# Patient Record
Sex: Male | Born: 1950
Health system: Southern US, Community
[De-identification: ages and names within clinical notes are randomized; demographics above are authoritative.]

## PROBLEM LIST (undated history)

## (undated) DIAGNOSIS — E119 Type 2 diabetes mellitus without complications: Secondary | ICD-10-CM

## (undated) DIAGNOSIS — J439 Emphysema, unspecified: Secondary | ICD-10-CM

## (undated) DIAGNOSIS — G459 Transient cerebral ischemic attack, unspecified: Secondary | ICD-10-CM

## (undated) DIAGNOSIS — C61 Malignant neoplasm of prostate: Secondary | ICD-10-CM

## (undated) DIAGNOSIS — I10 Essential (primary) hypertension: Secondary | ICD-10-CM

## (undated) DIAGNOSIS — K5792 Diverticulitis of intestine, part unspecified, without perforation or abscess without bleeding: Secondary | ICD-10-CM

## (undated) DIAGNOSIS — J449 Chronic obstructive pulmonary disease, unspecified: Secondary | ICD-10-CM

## (undated) DIAGNOSIS — M549 Dorsalgia, unspecified: Secondary | ICD-10-CM

## (undated) DIAGNOSIS — M479 Spondylosis, unspecified: Secondary | ICD-10-CM

## (undated) DIAGNOSIS — K602 Anal fissure, unspecified: Secondary | ICD-10-CM

## (undated) DIAGNOSIS — R351 Nocturia: Secondary | ICD-10-CM

## (undated) DIAGNOSIS — G8929 Other chronic pain: Secondary | ICD-10-CM

## (undated) DIAGNOSIS — R011 Cardiac murmur, unspecified: Secondary | ICD-10-CM

## (undated) DIAGNOSIS — N400 Enlarged prostate without lower urinary tract symptoms: Secondary | ICD-10-CM

## (undated) HISTORY — PX: INGUINAL HERNIA REPAIR: SUR1180

## (undated) HISTORY — DX: Anal fissure, unspecified: K60.2

---

## 1997-08-21 ENCOUNTER — Ambulatory Visit (HOSPITAL_COMMUNITY): Admission: RE | Admit: 1997-08-21 | Discharge: 1997-08-21 | Payer: Self-pay | Admitting: Internal Medicine

## 1998-06-26 ENCOUNTER — Emergency Department (HOSPITAL_COMMUNITY): Admission: EM | Admit: 1998-06-26 | Discharge: 1998-06-26 | Payer: Self-pay | Admitting: Emergency Medicine

## 1998-06-26 ENCOUNTER — Encounter: Payer: Self-pay | Admitting: Emergency Medicine

## 1998-07-04 ENCOUNTER — Encounter: Admission: RE | Admit: 1998-07-04 | Discharge: 1998-07-04 | Payer: Self-pay | Admitting: Internal Medicine

## 2004-02-14 ENCOUNTER — Emergency Department: Payer: Self-pay | Admitting: Emergency Medicine

## 2004-03-18 ENCOUNTER — Inpatient Hospital Stay: Payer: Self-pay | Admitting: Internal Medicine

## 2004-03-18 ENCOUNTER — Other Ambulatory Visit: Payer: Self-pay

## 2004-03-19 ENCOUNTER — Other Ambulatory Visit: Payer: Self-pay

## 2004-08-31 ENCOUNTER — Emergency Department: Payer: Self-pay | Admitting: Emergency Medicine

## 2004-10-18 ENCOUNTER — Emergency Department: Payer: Self-pay | Admitting: General Practice

## 2004-12-16 ENCOUNTER — Emergency Department: Payer: Self-pay | Admitting: Emergency Medicine

## 2005-04-22 ENCOUNTER — Emergency Department: Payer: Self-pay | Admitting: Internal Medicine

## 2005-05-04 HISTORY — PX: LUMBAR DISC SURGERY: SHX700

## 2005-10-03 ENCOUNTER — Emergency Department: Payer: Self-pay | Admitting: Emergency Medicine

## 2005-10-21 ENCOUNTER — Emergency Department: Payer: Self-pay | Admitting: Unknown Physician Specialty

## 2006-02-21 ENCOUNTER — Emergency Department: Payer: Self-pay | Admitting: Emergency Medicine

## 2006-04-30 ENCOUNTER — Emergency Department: Payer: Self-pay | Admitting: Emergency Medicine

## 2006-07-20 ENCOUNTER — Emergency Department: Payer: Self-pay | Admitting: Emergency Medicine

## 2006-11-04 ENCOUNTER — Ambulatory Visit: Payer: Self-pay | Admitting: General Surgery

## 2007-02-24 ENCOUNTER — Emergency Department: Payer: Self-pay | Admitting: Emergency Medicine

## 2007-11-01 ENCOUNTER — Emergency Department: Payer: Self-pay | Admitting: Internal Medicine

## 2007-11-11 ENCOUNTER — Ambulatory Visit (HOSPITAL_COMMUNITY): Admission: RE | Admit: 2007-11-11 | Discharge: 2007-11-11 | Payer: Self-pay | Admitting: Neurological Surgery

## 2008-02-20 ENCOUNTER — Ambulatory Visit: Payer: Self-pay | Admitting: Neurological Surgery

## 2008-03-28 ENCOUNTER — Encounter: Payer: Self-pay | Admitting: Neurological Surgery

## 2008-04-04 ENCOUNTER — Observation Stay: Payer: Self-pay | Admitting: Internal Medicine

## 2010-09-01 ENCOUNTER — Other Ambulatory Visit: Payer: Self-pay | Admitting: Orthopedic Surgery

## 2010-09-01 DIAGNOSIS — M48062 Spinal stenosis, lumbar region with neurogenic claudication: Secondary | ICD-10-CM

## 2010-09-08 ENCOUNTER — Other Ambulatory Visit: Payer: Self-pay

## 2010-09-16 NOTE — Op Note (Signed)
NAMEEMMIT, Ryan Hopkins               ACCOUNT NO.:  [id number]   MEDICAL RECORD NO.:  [id number]          PATIENT TYPE:  OIB   LOCATION:  3534                         FACILITY:  [facility]   PHYSICIAN:  Tia Alert, MD     DATE OF BIRTH:  21-Sep-1950   DATE OF PROCEDURE:  11/11/2007  DATE OF DISCHARGE:  11/11/2007                               OPERATIVE REPORT   PREOPERATIVE DIAGNOSIS:  Lumbar spinal stenosis L4-5.   POSTOPERATIVE DIAGNOSIS:  Lumbar spinal stenosis L4-5.   PROCEDURE:  1. Decompressive lumbar hemilaminectomy.  2. Medial facetectomy and foraminotomy at L4-5 on the right for      decompression of the right L5 nerve root followed by sublaminar      decompression for central canal and left L5 nerve root      decompression.   SURGEON:  Marikay Alar, MD   ASSISTANT:  Reinaldo Meeker, MD   ANESTHESIA:  General endotracheal.   COMPLICATIONS:  None apparent.   INDICATIONS FOR THE PROCEDURE:  Mr. Ryan Hopkins is a 60 year old gentleman  who was referred with back and bilateral leg pain right greater than  left.  He had an MRI which showed moderately severe stenosis at L4-5.  He had tried medical management for quite sometime without significant  relief.  We talked about treatment options.  He wished to proceed with a  lumbar decompressive laminectomy at L4-5.  He understood the risks,  benefits, expected outcome and wished to proceed.   DESCRIPTION OF THE PROCEDURE:  The patient was taken to the operating  room and after induction of adequate generalized endotracheal  anesthesia, he was rolled in a prone position on the Wilson frame and  all pressure points were padded.  His lumbar region was prepped with  DuraPrep and then draped in usual sterile fashion.  Local anesthesia 5  mL was injected and a small dorsal midline incision was made and carried  down to the lumbosacral fascia.  The fascia was opened on the patient's  right side and taken down in a subperiosteal fashion to  expose L4-5 on  the right.  Intraoperative x-ray confirmed my level and the use of a  high-speed drill and a Kerrison punch was used to perform a  hemilaminectomy, medial facetectomy with foraminotomy at L4-5 on the  right side.  The underlying yellow ligament was quite overgrown.  It was  opened and removed in a piecemeal fashion to expose the underlying dura  in L5 nerve root.  I undercut the lateral recess to decompress the  lateral recess in the L5 nerve root.  I continued my laminotomy  superiorly until the dura was relaxed and the yellow ligament was  removed.  I then used the high-speed drill to drill up under the spinous  process and the Kerrison punch.  I then performed a sublaminar  decompression at L4-5 to decompress the central canal and the left L5  nerve root.  I dissected to the left until I could identify the disk  space.  There was no obvious disk herniation.  I could see the L5 nerve  root exiting and felt the pedicle palpated along the L5 nerve root on  the left to assure adequate decompression.  I then palpated the pedicle  along the nerve root on the right side.  I felt no further compression  of the L5 nerve roots bilaterally.  The disk space was then inspected  bilaterally. I found only a small subannular bulge.  I therefore  irrigated with saline solution containing bacitracin, dried all bleeding  points, lined the dura with Duragen to help prevent epidural fibrosis,  then closed the fascia with 0 Vicryl, closed the subcutaneous and  subcuticular tissue with 2-0 and 3-0 Vicryl and closed the skin with  Benzoin Steri-Strips.  The drapes were removed.  Sterile dressing was  applied.  The patient was awakened from general anesthesia and  transferred to the recovery room in stable condition.  At the end of the  procedure, all sponge, needle and instrument counts were correct.      Tia Alert, MD  Electronically Signed     DSJ/MEDQ  D:  11/11/2007  T:   11/11/2007  Job:  [id number]

## 2011-01-29 LAB — CBC
HCT: 45.4
Hemoglobin: 16
MCHC: 35.2
MCV: 91.1
Platelets: 271
RBC: 4.98
RDW: 14.4
WBC: 8.2

## 2011-01-29 LAB — BASIC METABOLIC PANEL
BUN: 13
CO2: 25
Calcium: 9.5
Chloride: 101
Creatinine, Ser: 1.34
GFR calc Af Amer: >60
GFR calc non Af Amer: 55 — ABNORMAL LOW
Glucose, Bld: 113 — ABNORMAL HIGH
Potassium: 3.5
Sodium: 134 — ABNORMAL LOW

## 2011-01-29 LAB — DIFFERENTIAL
Basophils Absolute: 0.1
Basophils Relative: 1
Eosinophils Relative: 2
Monocytes Absolute: 0.7
Monocytes Relative: 9
Neutro Abs: 4.9

## 2012-02-03 ENCOUNTER — Other Ambulatory Visit: Payer: Self-pay | Admitting: Neurological Surgery

## 2012-02-03 DIAGNOSIS — M545 Low back pain: Secondary | ICD-10-CM

## 2012-02-09 ENCOUNTER — Inpatient Hospital Stay: Admission: RE | Admit: 2012-02-09 | Payer: Self-pay | Source: Ambulatory Visit

## 2012-02-18 ENCOUNTER — Other Ambulatory Visit: Payer: Self-pay

## 2012-02-22 ENCOUNTER — Ambulatory Visit
Admission: RE | Admit: 2012-02-22 | Discharge: 2012-02-22 | Disposition: A | Discharge status: Home / Self Care | Payer: Medicare Other | Source: Ambulatory Visit | Attending: Neurological Surgery | Admitting: Neurological Surgery

## 2012-02-22 ENCOUNTER — Other Ambulatory Visit: Payer: Self-pay

## 2012-02-22 DIAGNOSIS — M545 Low back pain: Secondary | ICD-10-CM

## 2012-02-22 MED ORDER — GADOBENATE DIMEGLUMINE 529 MG/ML IV SOLN
20.0000 mL | Freq: Once | INTRAVENOUS | Status: AC | PRN
  Administered 2012-02-22: 20 mL via INTRAVENOUS

## 2012-04-15 ENCOUNTER — Encounter (HOSPITAL_COMMUNITY): Payer: Self-pay | Admitting: Emergency Medicine

## 2012-04-15 ENCOUNTER — Emergency Department (HOSPITAL_COMMUNITY)
Admission: EM | Admit: 2012-04-15 | Discharge: 2012-04-16 | Disposition: A | Discharge status: Home / Self Care | Payer: Medicare Other | Attending: Emergency Medicine | Admitting: Emergency Medicine

## 2012-04-15 DIAGNOSIS — J449 Chronic obstructive pulmonary disease, unspecified: Secondary | ICD-10-CM | POA: Insufficient documentation

## 2012-04-15 DIAGNOSIS — Z87448 Personal history of other diseases of urinary system: Secondary | ICD-10-CM | POA: Insufficient documentation

## 2012-04-15 DIAGNOSIS — R112 Nausea with vomiting, unspecified: Secondary | ICD-10-CM | POA: Insufficient documentation

## 2012-04-15 DIAGNOSIS — Z9889 Other specified postprocedural states: Secondary | ICD-10-CM | POA: Insufficient documentation

## 2012-04-15 DIAGNOSIS — J4489 Other specified chronic obstructive pulmonary disease: Secondary | ICD-10-CM | POA: Insufficient documentation

## 2012-04-15 DIAGNOSIS — K5732 Diverticulitis of large intestine without perforation or abscess without bleeding: Secondary | ICD-10-CM | POA: Insufficient documentation

## 2012-04-15 DIAGNOSIS — R05 Cough: Secondary | ICD-10-CM | POA: Insufficient documentation

## 2012-04-15 DIAGNOSIS — Z8739 Personal history of other diseases of the musculoskeletal system and connective tissue: Secondary | ICD-10-CM | POA: Insufficient documentation

## 2012-04-15 DIAGNOSIS — J438 Other emphysema: Secondary | ICD-10-CM | POA: Insufficient documentation

## 2012-04-15 DIAGNOSIS — R059 Cough, unspecified: Secondary | ICD-10-CM | POA: Insufficient documentation

## 2012-04-15 DIAGNOSIS — R1012 Left upper quadrant pain: Secondary | ICD-10-CM | POA: Insufficient documentation

## 2012-04-15 DIAGNOSIS — K5792 Diverticulitis of intestine, part unspecified, without perforation or abscess without bleeding: Secondary | ICD-10-CM

## 2012-04-15 DIAGNOSIS — Z79899 Other long term (current) drug therapy: Secondary | ICD-10-CM | POA: Insufficient documentation

## 2012-04-15 DIAGNOSIS — R197 Diarrhea, unspecified: Secondary | ICD-10-CM | POA: Insufficient documentation

## 2012-04-15 DIAGNOSIS — E876 Hypokalemia: Secondary | ICD-10-CM | POA: Insufficient documentation

## 2012-04-15 DIAGNOSIS — M549 Dorsalgia, unspecified: Secondary | ICD-10-CM | POA: Insufficient documentation

## 2012-04-15 DIAGNOSIS — I10 Essential (primary) hypertension: Secondary | ICD-10-CM | POA: Insufficient documentation

## 2012-04-15 DIAGNOSIS — Z87891 Personal history of nicotine dependence: Secondary | ICD-10-CM | POA: Insufficient documentation

## 2012-04-15 HISTORY — DX: Chronic obstructive pulmonary disease, unspecified: J44.9

## 2012-04-15 HISTORY — DX: Essential (primary) hypertension: I10

## 2012-04-15 HISTORY — DX: Emphysema, unspecified: J43.9

## 2012-04-15 HISTORY — DX: Benign prostatic hyperplasia without lower urinary tract symptoms: N40.0

## 2012-04-15 HISTORY — DX: Spondylosis, unspecified: M47.9

## 2012-04-15 LAB — CBC WITH DIFFERENTIAL/PLATELET
Basophils Absolute: 0.1 10*3/uL (ref 0.0–0.1)
Basophils Relative: 1 % (ref 0–1)
Eosinophils Absolute: 0.1 10*3/uL (ref 0.0–0.7)
Hemoglobin: 15.9 g/dL (ref 13.0–17.0)
Lymphocytes Relative: 27 % (ref 12–46)
MCH: 31.2 pg (ref 26.0–34.0)
MCHC: 35.9 g/dL (ref 30.0–36.0)
Monocytes Absolute: 0.7 10*3/uL (ref 0.1–1.0)
Neutro Abs: 5.4 10*3/uL (ref 1.7–7.7)
Neutrophils Relative %: 63 % (ref 43–77)
RDW: 14.7 % (ref 11.5–15.5)

## 2012-04-15 LAB — URINALYSIS, MICROSCOPIC ONLY
Leukocytes, UA: NEGATIVE
Nitrite: NEGATIVE
Specific Gravity, Urine: 1.008 (ref 1.005–1.030)
Urobilinogen, UA: 0.2 mg/dL (ref 0.0–1.0)
pH: 5 (ref 5.0–8.0)

## 2012-04-15 LAB — LIPASE, BLOOD: Lipase: 33 U/L (ref 11–59)

## 2012-04-15 LAB — COMPREHENSIVE METABOLIC PANEL
Albumin: 4.2 g/dL (ref 3.5–5.2)
BUN: 11 mg/dL (ref 6–23)
Calcium: 10 mg/dL (ref 8.4–10.5)
GFR calc Af Amer: 61 mL/min — ABNORMAL LOW (ref >90)
Glucose, Bld: 101 mg/dL — ABNORMAL HIGH (ref 70–99)
Sodium: 136 mEq/L (ref 135–145)
Total Protein: 7.5 g/dL (ref 6.0–8.3)

## 2012-04-15 NOTE — ED Notes (Signed)
Pt to ED via [facility].  Reports productive cough with dark brown/black phlegm, nausea, lower abd pain, frequent urination, diarrhea, vomited x 2 today and lower back pain/burning x 2-3 days.

## 2012-04-16 ENCOUNTER — Emergency Department (HOSPITAL_COMMUNITY): Payer: Medicare Other

## 2012-04-16 MED ORDER — ONDANSETRON HCL 4 MG/2ML IJ SOLN
4.0000 mg | Freq: Once | INTRAMUSCULAR | Status: AC
  Administered 2012-04-16: 4 mg via INTRAVENOUS
  Filled 2012-04-16: qty 2

## 2012-04-16 MED ORDER — OXYCODONE-ACETAMINOPHEN 5-325 MG PO TABS: 1.0000 | ORAL_TABLET | ORAL | Status: DC | PRN

## 2012-04-16 MED ORDER — HYDROMORPHONE HCL PF 1 MG/ML IJ SOLN
1.0000 mg | Freq: Once | INTRAMUSCULAR | Status: AC
  Administered 2012-04-16: 1 mg via INTRAVENOUS
  Filled 2012-04-16: qty 1

## 2012-04-16 MED ORDER — CIPROFLOXACIN HCL 500 MG PO TABS: 500.0000 mg | ORAL_TABLET | Freq: Two times a day (BID) | ORAL | Status: DC

## 2012-04-16 MED ORDER — POTASSIUM CHLORIDE CRYS ER 20 MEQ PO TBCR
40.0000 meq | EXTENDED_RELEASE_TABLET | Freq: Once | ORAL | Status: AC
  Administered 2012-04-16: 40 meq via ORAL
  Filled 2012-04-16: qty 2

## 2012-04-16 MED ORDER — METRONIDAZOLE 500 MG PO TABS
500.0000 mg | ORAL_TABLET | Freq: Once | ORAL | Status: AC
  Administered 2012-04-16: 500 mg via ORAL
  Filled 2012-04-16: qty 1

## 2012-04-16 MED ORDER — POTASSIUM CHLORIDE 10 MEQ/100ML IV SOLN
10.0000 meq | Freq: Once | INTRAVENOUS | Status: AC
  Administered 2012-04-16: 10 meq via INTRAVENOUS
  Filled 2012-04-16: qty 100

## 2012-04-16 MED ORDER — SODIUM CHLORIDE 0.9 % IV BOLUS (SEPSIS)
1000.0000 mL | Freq: Once | INTRAVENOUS | Status: AC
  Administered 2012-04-16: 1000 mL via INTRAVENOUS

## 2012-04-16 MED ORDER — POTASSIUM CHLORIDE ER 10 MEQ PO TBCR: 10.0000 meq | EXTENDED_RELEASE_TABLET | Freq: Two times a day (BID) | ORAL | Status: DC

## 2012-04-16 MED ORDER — CIPROFLOXACIN IN D5W 400 MG/200ML IV SOLN
400.0000 mg | Freq: Once | INTRAVENOUS | Status: AC
  Administered 2012-04-16: 400 mg via INTRAVENOUS
  Filled 2012-04-16: qty 200

## 2012-04-16 MED ORDER — METRONIDAZOLE 500 MG PO TABS: 500.0000 mg | ORAL_TABLET | Freq: Two times a day (BID) | ORAL | Status: DC

## 2012-04-16 MED ORDER — ONDANSETRON 4 MG PO TBDP: 4.0000 mg | ORAL_TABLET | Freq: Three times a day (TID) | ORAL | Status: DC | PRN

## 2012-04-16 NOTE — ED Notes (Signed)
Back from CT, no changes, alert, NAD, calm.  ?

## 2012-04-16 NOTE — ED Notes (Signed)
EDP Dr. Hyacinth Meeker at [facility]

## 2012-04-16 NOTE — ED Notes (Signed)
KCL run infusing, alert, NAD, calm, resting/ sleeping.

## 2012-04-16 NOTE — ED Provider Notes (Signed)
History     CSN: [id number]  Arrival date & time 04/15/12  2043   First MD Initiated Contact with Patient 04/16/12 0010      Chief Complaint  Patient presents with  . Cough  . Nausea  . Back Pain  . Abdominal Pain    (Consider location/radiation/quality/duration/timing/severity/associated sxs/prior treatment) HPI Comments: 61 year old male with a history of hypertension and emphysema who presents with a complaint of one week of gradual onset, persistent, gradually worsening lower abdominal pain on the left associated with loose stools which are nonbloody. He has occasional nausea and emesis but it is nonbloody and nonbilious. Nothing seems to make this better, he is able to tolerate fluids and has been making urine without difficulty or dysuria. He denies fevers, chills, swelling, rashes, headache. He does admit to having intermittent productive cough over the last week as well. There has been no travel, no recent antibiotics, no sick contacts.  Patient is a 61 y.o. male presenting with cough, back pain, and abdominal pain. The history is provided by the patient.  Cough  Back Pain  Associated symptoms include abdominal pain.  Abdominal Pain The primary symptoms of the illness include abdominal pain.  Additional symptoms associated with the illness include back pain.    Past Medical History  Diagnosis Date  . Hypertension   . COPD (chronic obstructive pulmonary disease)   . Emphysema   . Enlarged prostate   . Degenerative arthritis of spine     Past Surgical History  Procedure Date  . Back surgery     L4-L5  . Hernia repair     No family history on file.  History  Substance Use Topics  . Smoking status: Former Games developer  . Smokeless tobacco: Not on file  . Alcohol Use: No      Review of Systems  Respiratory: Positive for cough.   Gastrointestinal: Positive for abdominal pain.  Musculoskeletal: Positive for back pain.  All other systems reviewed and are  negative.    Allergies  Review of patient's allergies indicates no known allergies.  Home Medications   Current Outpatient Rx  Name  Route  Sig  Dispense  Refill  . ALBUTEROL SULFATE HFA 108 (90 BASE) MCG/ACT IN AERS   Inhalation   Inhale 2 puffs into the lungs every 6 (six) hours as needed. Shortness of breath         . FLUTICASONE-SALMETEROL 250-50 MCG/DOSE IN AEPB   Inhalation   Inhale 1 puff into the lungs every 12 (twelve) hours.         . IBUPROFEN 200 MG PO TABS   Oral   Take 200 mg by mouth every 6 (six) hours as needed. For headache or pain         . CIPROFLOXACIN HCL 500 MG PO TABS   Oral   Take 1 tablet (500 mg total) by mouth every 12 (twelve) hours.   20 tablet   0   . METRONIDAZOLE 500 MG PO TABS   Oral   Take 1 tablet (500 mg total) by mouth 2 (two) times daily.   20 tablet   0   . ONDANSETRON 4 MG PO TBDP   Oral   Take 1 tablet (4 mg total) by mouth every 8 (eight) hours as needed for nausea.   10 tablet   0   . OXYCODONE-ACETAMINOPHEN 5-325 MG PO TABS   Oral   Take 1 tablet by mouth every 4 (four) hours as needed for pain.  20 tablet   0   . POTASSIUM CHLORIDE ER 10 MEQ PO TBCR   Oral   Take 1 tablet (10 mEq total) by mouth 2 (two) times daily.   20 tablet   0     BP 151/100  Pulse 80  Temp 97.2 F (36.2 C) (Oral)  Resp 18  SpO2 97%  Physical Exam  Nursing note and vitals reviewed. Constitutional: He appears well-developed and well-nourished. No distress.  HENT:  Head: Normocephalic and atraumatic.  Mouth/Throat: Oropharynx is clear and moist. No oropharyngeal exudate.  Eyes: Conjunctivae normal and EOM are normal. Pupils are equal, round, and reactive to light. Right eye exhibits no discharge. Left eye exhibits no discharge. No scleral icterus.  Neck: Normal range of motion. Neck supple. No JVD present. No thyromegaly present.  Cardiovascular: Normal rate, regular rhythm, normal heart sounds and intact distal pulses.  Exam  reveals no gallop and no friction rub.   No murmur heard. Pulmonary/Chest: Effort normal and breath sounds normal. No respiratory distress. He has no wheezes. He has no rales.  Abdominal: Soft. He exhibits no distension and no mass. There is tenderness ( Left lower quadrant tenderness, left upper quadrant tenderness, no right sided tenderness, no peritoneal signs, no masses, no guarding).       Increased bowel sounds  Musculoskeletal: Normal range of motion. He exhibits no edema and no tenderness.  Lymphadenopathy:    He has no cervical adenopathy.  Neurological: He is alert. Coordination normal.  Skin: Skin is warm and dry. No rash noted. No erythema.  Psychiatric: He has a normal mood and affect. His behavior is normal.    ED Course  Procedures (including critical care time)  Labs Reviewed  COMPREHENSIVE METABOLIC PANEL - Abnormal; Notable for the following:    Potassium 2.9 (*)     Glucose, Bld 101 (*)     Creatinine, Ser 1.38 (*)     GFR calc non Af Amer 53 (*)     GFR calc Af Amer 61 (*)     All other components within normal limits  URINALYSIS, MICROSCOPIC ONLY - Abnormal; Notable for the following:    APPearance CLOUDY (*)     All other components within normal limits  CBC WITH DIFFERENTIAL  LIPASE, BLOOD   Dg Chest 2 View  04/16/2012  *RADIOLOGY REPORT*  Clinical Data: Shortness of breath and weakness.  CHEST - 2 VIEW  Comparison: 11/08/2007  Findings: The heart size and pulmonary vascularity are normal. The lungs appear clear and expanded without focal air space disease or consolidation. No blunting of the costophrenic angles.  No pneumothorax.  Mediastinal contours appear intact.  Degenerative changes in the spine.  No significant change since previous study.  IMPRESSION: No evidence of active pulmonary disease.   Original Report Authenticated By: Burman Nieves, M.D.      1. Diverticulitis   2. Hypokalemia       MDM  Mucous membranes are moist, he does not have  any signs of tachycardia hypotension or hypoxia and is not febrile. He does have hypokalemia and his symptoms are consistent with a colitis or diverticulitis. He'll be given IV antibiotics, fluids, pain medication, but potassium supplementation and Zofran. He does not need CT scan at this time as he is afebrile and is unlikely to have an abscess without a leukocytosis of fever or tachycardia.  The patient has been reevaluated, he appears much more comfortable, his symptoms have improved significantly, lab work has been reviewed showing  hypokalemia but no other significant findings.  PA and lateral views of the chest were obtained by digital radiography. I have personally interpreted these x-rays and find her to be no signs of pulmonary infiltrate, cardiomegaly, subdiaphragmatic free air, soft tissue abnormality, no obvious bony abnormalities or fractures.  Discharge with Percocet, Zofran, ciprofloxacin, Flagyl, potassium  The patient has been informed of indications for return, resource list given for followup.  Vida Roller, MD 04/16/12 [phone]

## 2012-09-23 ENCOUNTER — Encounter (HOSPITAL_COMMUNITY): Payer: Self-pay | Admitting: Cardiology

## 2012-09-23 ENCOUNTER — Emergency Department (HOSPITAL_COMMUNITY)
Admission: EM | Admit: 2012-09-23 | Discharge: 2012-09-23 | Disposition: A | Discharge status: Home / Self Care | Payer: Medicare Other | Attending: Emergency Medicine | Admitting: Emergency Medicine

## 2012-09-23 DIAGNOSIS — IMO0002 Reserved for concepts with insufficient information to code with codable children: Secondary | ICD-10-CM | POA: Insufficient documentation

## 2012-09-23 DIAGNOSIS — R197 Diarrhea, unspecified: Secondary | ICD-10-CM | POA: Insufficient documentation

## 2012-09-23 DIAGNOSIS — I10 Essential (primary) hypertension: Secondary | ICD-10-CM | POA: Insufficient documentation

## 2012-09-23 DIAGNOSIS — Z8739 Personal history of other diseases of the musculoskeletal system and connective tissue: Secondary | ICD-10-CM | POA: Insufficient documentation

## 2012-09-23 DIAGNOSIS — Z7982 Long term (current) use of aspirin: Secondary | ICD-10-CM | POA: Insufficient documentation

## 2012-09-23 DIAGNOSIS — M5416 Radiculopathy, lumbar region: Secondary | ICD-10-CM

## 2012-09-23 DIAGNOSIS — R350 Frequency of micturition: Secondary | ICD-10-CM | POA: Insufficient documentation

## 2012-09-23 DIAGNOSIS — Z8719 Personal history of other diseases of the digestive system: Secondary | ICD-10-CM | POA: Insufficient documentation

## 2012-09-23 DIAGNOSIS — Z79899 Other long term (current) drug therapy: Secondary | ICD-10-CM | POA: Insufficient documentation

## 2012-09-23 DIAGNOSIS — Z87891 Personal history of nicotine dependence: Secondary | ICD-10-CM | POA: Insufficient documentation

## 2012-09-23 DIAGNOSIS — M6281 Muscle weakness (generalized): Secondary | ICD-10-CM | POA: Insufficient documentation

## 2012-09-23 DIAGNOSIS — Z87448 Personal history of other diseases of urinary system: Secondary | ICD-10-CM | POA: Insufficient documentation

## 2012-09-23 DIAGNOSIS — J438 Other emphysema: Secondary | ICD-10-CM | POA: Insufficient documentation

## 2012-09-23 DIAGNOSIS — Z9889 Other specified postprocedural states: Secondary | ICD-10-CM | POA: Insufficient documentation

## 2012-09-23 MED ORDER — OXYCODONE-ACETAMINOPHEN 5-325 MG PO TABS
1.0000 | ORAL_TABLET | Freq: Once | ORAL | Status: AC
  Administered 2012-09-23: 1 via ORAL
  Filled 2012-09-23: qty 1

## 2012-09-23 MED ORDER — OXYCODONE-ACETAMINOPHEN 5-325 MG PO TABS: ORAL_TABLET | ORAL | Status: DC

## 2012-09-23 MED ORDER — IBUPROFEN 400 MG PO TABS
400.0000 mg | ORAL_TABLET | Freq: Once | ORAL | Status: AC
  Administered 2012-09-23: 400 mg via ORAL
  Filled 2012-09-23: qty 1

## 2012-09-23 NOTE — ED Notes (Signed)
PA at bedside.

## 2012-09-23 NOTE — ED Provider Notes (Addendum)
History    This chart was scribed for a non-physician practitioner, Wynetta Emery, working with Hurman Horn, MD by Frederik Pear, ED Scribe. This patient was seen in room TR04C/TR04C and the patient's care was started at 1503.     CSN: [id number]  Arrival date & time 09/23/12  1445   First MD Initiated Contact with Patient 09/23/12 1503      Chief Complaint  Patient presents with  . Back Pain    (Consider location/radiation/quality/duration/timing/severity/associated sxs/prior treatment) The history is provided by the patient and medical records. No language interpreter was used.    HPI Comments: Ryan Hopkins is a 62 y.o. male who presents to the Emergency Department complaining of sudden onset, 9/10, gradually worsening, constant left-sided lower back pain that shoot down his bilateral legs that is not aggravated or alleviated by anything and began 3 days ago. Endorses urinary frequency at night which is at his baseline. He also had a single episode of diarrhea in the last week. Patient denies abdominal pain, weakness, injury or trauma, fever or hematuria. Denies treatment at home. He has no h/o of cancer or IV drug use, but has a h/o of diverticulosis.   Past Medical History  Diagnosis Date  . Hypertension   . COPD (chronic obstructive pulmonary disease)   . Emphysema   . Enlarged prostate   . Degenerative arthritis of spine     Past Surgical History  Procedure Laterality Date  . Back surgery      L4-L5  . Hernia repair      History reviewed. No pertinent family history.  History  Substance Use Topics  . Smoking status: Former Games developer  . Smokeless tobacco: Not on file  . Alcohol Use: No      Review of Systems  Respiratory: Negative for shortness of breath.   Gastrointestinal: Positive for diarrhea. Negative for nausea and vomiting.  Genitourinary: Positive for frequency.  Musculoskeletal: Positive for back pain.  All other systems reviewed and are  negative.    Allergies  Review of patient's allergies indicates no known allergies.  Home Medications   Current Outpatient Rx  Name  Route  Sig  Dispense  Refill  . albuterol (PROVENTIL HFA;VENTOLIN HFA) 108 (90 BASE) MCG/ACT inhaler   Inhalation   Inhale 2 puffs into the lungs every 6 (six) hours as needed. Shortness of breath         . aspirin EC 81 MG tablet   Oral   Take 81 mg by mouth daily.         . Fluticasone-Salmeterol (ADVAIR) 250-50 MCG/DOSE AEPB   Inhalation   Inhale 1 puff into the lungs every 12 (twelve) hours.         . hydrochlorothiazide (HYDRODIURIL) 25 MG tablet   Oral   Take 25 mg by mouth daily.           BP 134/98  Pulse 111  Temp(Src) 98 F (36.7 C) (Oral)  Resp 20  SpO2 99%  Physical Exam  Nursing note and vitals reviewed. Constitutional: He is oriented to person, place, and time. He appears well-developed and well-nourished. No distress.  HENT:  Head: Normocephalic.  Eyes: Conjunctivae and EOM are normal.  Cardiovascular: Normal rate.   Pulmonary/Chest: Effort normal. No stridor.  Abdominal: Soft. He exhibits no distension.  Musculoskeletal: Normal range of motion. He exhibits tenderness.  No midline tenderness to palationp or step offs. Mild left paraspinal spasms with tenderness.   Neurological: He is alert and oriented  to person, place, and time.  Psychiatric: He has a normal mood and affect.    ED Course  Procedures (including critical care time)  DIAGNOSTIC STUDIES: Oxygen Saturation is 99% on room air, normal by my interpretation.    COORDINATION OF CARE:  16:30- Discussed planned course of treatment with the patient, including Percocet and Advil, who is agreeable at this time.  16:45- Medication Orders- ibuprofen (advil, motrin) tablet 400 mg- once, oxycodone-acetaminophen (perceocet/roxicet) 5-325 mg per tablet 1 tablet- once.  Labs Reviewed - No data to display No results found.   1. Lumbar radiculopathy        MDM   Filed Vitals:   09/23/12 1447 09/23/12 1645  BP: 134/98 144/98  Pulse: 111 98  Temp: 98 F (36.7 C)   TempSrc: Oral   Resp: 20   SpO2: 99% 98%     Leonte Horrigan is a 62 y.o. male with exacerbation of lumbar radiculopathy, no red flags.   Medications  ibuprofen (ADVIL,MOTRIN) tablet 400 mg (400 mg Oral Given 09/23/12 1644)  oxyCODONE-acetaminophen (PERCOCET/ROXICET) 5-325 MG per tablet 1 tablet (1 tablet Oral Given 09/23/12 1644)    The patient is hemodynamically stable, appropriate for, and amenable to, discharge at this time. Pt verbalized understanding and agrees with care plan. Outpatient follow-up and return precautions given.    Discharge Medication List as of 09/23/2012  4:35 PM    START taking these medications   Details  oxyCODONE-acetaminophen (PERCOCET/ROXICET) 5-325 MG per tablet 1 to 2 tabs PO q6hrs  PRN for pain, Print              Wynetta Emery, PA-C 09/24/12 [address], PA-C 09/29/12 1804

## 2012-09-23 NOTE — ED Notes (Signed)
Pt reports lower back pain that started about a week ago. States he has been increasing his daily walking. Denies trying any OTC medications but has tried heat to the area. Denies any heavy lifting or moving.

## 2012-09-26 NOTE — ED Provider Notes (Signed)
Medical screening examination/treatment/procedure(s) were performed by non-physician practitioner and as supervising physician I was immediately available for consultation/collaboration.   Taiven Greenley M Sarinah Doetsch, MD 09/26/12 1723 

## 2012-09-29 NOTE — ED Provider Notes (Signed)
Medical screening examination/treatment/procedure(s) were performed by non-physician practitioner and as supervising physician I was immediately available for consultation/collaboration.  Hurman Horn, MD 09/29/12 [phone]

## 2012-10-16 ENCOUNTER — Emergency Department (HOSPITAL_COMMUNITY)
Admission: EM | Admit: 2012-10-16 | Discharge: 2012-10-16 | Disposition: A | Discharge status: Home / Self Care | Payer: Medicare Other | Attending: Emergency Medicine | Admitting: Emergency Medicine

## 2012-10-16 ENCOUNTER — Encounter (HOSPITAL_COMMUNITY): Payer: Self-pay | Admitting: *Deleted

## 2012-10-16 DIAGNOSIS — I10 Essential (primary) hypertension: Secondary | ICD-10-CM

## 2012-10-16 DIAGNOSIS — J449 Chronic obstructive pulmonary disease, unspecified: Secondary | ICD-10-CM | POA: Insufficient documentation

## 2012-10-16 DIAGNOSIS — J4489 Other specified chronic obstructive pulmonary disease: Secondary | ICD-10-CM | POA: Insufficient documentation

## 2012-10-16 DIAGNOSIS — N400 Enlarged prostate without lower urinary tract symptoms: Secondary | ICD-10-CM | POA: Insufficient documentation

## 2012-10-16 DIAGNOSIS — Z87891 Personal history of nicotine dependence: Secondary | ICD-10-CM | POA: Insufficient documentation

## 2012-10-16 DIAGNOSIS — Z885 Allergy status to narcotic agent status: Secondary | ICD-10-CM | POA: Insufficient documentation

## 2012-10-16 DIAGNOSIS — R51 Headache: Secondary | ICD-10-CM | POA: Insufficient documentation

## 2012-10-16 DIAGNOSIS — M479 Spondylosis, unspecified: Secondary | ICD-10-CM | POA: Insufficient documentation

## 2012-10-16 DIAGNOSIS — Z79899 Other long term (current) drug therapy: Secondary | ICD-10-CM | POA: Insufficient documentation

## 2012-10-16 DIAGNOSIS — G8929 Other chronic pain: Secondary | ICD-10-CM | POA: Insufficient documentation

## 2012-10-16 DIAGNOSIS — Z7982 Long term (current) use of aspirin: Secondary | ICD-10-CM | POA: Insufficient documentation

## 2012-10-16 DIAGNOSIS — M549 Dorsalgia, unspecified: Secondary | ICD-10-CM

## 2012-10-16 DIAGNOSIS — J438 Other emphysema: Secondary | ICD-10-CM | POA: Insufficient documentation

## 2012-10-16 MED ORDER — HYDROCHLOROTHIAZIDE 25 MG PO TABS: 25.0000 mg | ORAL_TABLET | Freq: Every day | ORAL | Status: DC

## 2012-10-16 MED ORDER — IBUPROFEN 400 MG PO TABS: 400.0000 mg | ORAL_TABLET | Freq: Four times a day (QID) | ORAL | Status: DC | PRN

## 2012-10-16 MED ORDER — HYDROCODONE-ACETAMINOPHEN 5-325 MG PO TABS
1.0000 | ORAL_TABLET | Freq: Once | ORAL | Status: AC
  Administered 2012-10-16: 1 via ORAL
  Filled 2012-10-16: qty 1

## 2012-10-16 MED ORDER — ONDANSETRON 4 MG PO TBDP
8.0000 mg | ORAL_TABLET | Freq: Once | ORAL | Status: AC
  Administered 2012-10-16: 8 mg via ORAL

## 2012-10-16 MED ORDER — IBUPROFEN 400 MG PO TABS
400.0000 mg | ORAL_TABLET | Freq: Once | ORAL | Status: AC
  Administered 2012-10-16: 400 mg via ORAL
  Filled 2012-10-16: qty 1

## 2012-10-16 MED ORDER — ONDANSETRON 4 MG PO TBDP
ORAL_TABLET | ORAL | Status: AC
  Filled 2012-10-16: qty 2

## 2012-10-16 MED ORDER — OXYCODONE-ACETAMINOPHEN 5-325 MG PO TABS: 1.0000 | ORAL_TABLET | Freq: Four times a day (QID) | ORAL | Status: DC | PRN

## 2012-10-16 MED ORDER — HYDROCHLOROTHIAZIDE 25 MG PO TABS
25.0000 mg | ORAL_TABLET | Freq: Every day | ORAL | Status: DC
  Administered 2012-10-16: 25 mg via ORAL
  Filled 2012-10-16: qty 1

## 2012-10-16 NOTE — ED Notes (Signed)
PT . AMBULATED TO TOILET . RESPIRATIONS UNLABORED.

## 2012-10-16 NOTE — ED Notes (Signed)
Pt in c/o chronic lower back pain, states he has been out of his medication x2 week so pain has increased, also states he has been having headaches due to elevated BP, states he has been out of BP medication x2 months and is unable to get a doctor to write him a refill.

## 2012-10-17 MED ORDER — OXYCODONE-ACETAMINOPHEN 5-325 MG PO TABS: 1.0000 | ORAL_TABLET | Freq: Four times a day (QID) | ORAL | Status: DC | PRN

## 2012-10-20 NOTE — ED Provider Notes (Signed)
History     CSN: [id number]  Arrival date & time 10/16/12  1705   First MD Initiated Contact with Patient 10/16/12 1916      Chief Complaint  Patient presents with  . Back Pain  . Hypertension    (Consider location/radiation/quality/duration/timing/severity/associated sxs/prior treatment) HPI Comments: Pt comes in with cc of back pain and HTN. Has hx of HTN, not seen a PCP in months and hasn't taken BP meds in months too. Complains of mild headaches intermittently - which got him concerned. No nausea, vomiting, visual complains, seizures, altered mental status, loss of consciousness, new weakness, or numbness, no gait instability. Pt also has chronic back pain - and had a mild flare up recently.  No associated numbness, weakness, urinary incontinence, urinary retention, bowel incontinence, saddle anesthesia.     Patient is a 62 y.o. male presenting with back pain and hypertension. The history is provided by the patient.  Back Pain Associated symptoms: headaches   Associated symptoms: no abdominal pain, no chest pain and no dysuria   Hypertension Associated symptoms include headaches. Pertinent negatives include no chest pain, no abdominal pain and no shortness of breath.    Past Medical History  Diagnosis Date  . Hypertension   . COPD (chronic obstructive pulmonary disease)   . Emphysema   . Enlarged prostate   . Degenerative arthritis of spine     Past Surgical History  Procedure Laterality Date  . Back surgery      L4-L5  . Hernia repair      History reviewed. No pertinent family history.  History  Substance Use Topics  . Smoking status: Former Games developer  . Smokeless tobacco: Not on file  . Alcohol Use: No      Review of Systems  Constitutional: Negative for chills, activity change and appetite change.  HENT: Negative for neck pain.   Eyes: Negative for visual disturbance.  Respiratory: Negative for cough, chest tightness and shortness of breath.    Cardiovascular: Negative for chest pain.  Gastrointestinal: Negative for abdominal pain and abdominal distention.  Genitourinary: Negative for dysuria, enuresis and difficulty urinating.  Musculoskeletal: Positive for back pain. Negative for arthralgias.  Neurological: Positive for headaches. Negative for dizziness and light-headedness.  Psychiatric/Behavioral: Negative for confusion.    Allergies  Vicodin  Home Medications   Current Outpatient Rx  Name  Route  Sig  Dispense  Refill  . albuterol (PROVENTIL HFA;VENTOLIN HFA) 108 (90 BASE) MCG/ACT inhaler   Inhalation   Inhale 2 puffs into the lungs every 6 (six) hours as needed. Shortness of breath         . aspirin EC 81 MG tablet   Oral   Take 81 mg by mouth daily.         . Fluticasone-Salmeterol (ADVAIR) 250-50 MCG/DOSE AEPB   Inhalation   Inhale 1 puff into the lungs every 12 (twelve) hours.         Marland Kitchen oxyCODONE-acetaminophen (PERCOCET/ROXICET) 5-325 MG per tablet   Oral   Take 1-2 tablets by mouth every 6 (six) hours as needed for pain.         . hydrochlorothiazide (HYDRODIURIL) 25 MG tablet   Oral   Take 1 tablet (25 mg total) by mouth daily.   30 tablet   1   . ibuprofen (ADVIL,MOTRIN) 400 MG tablet   Oral   Take 1 tablet (400 mg total) by mouth every 6 (six) hours as needed for pain.   30 tablet   0   .  oxyCODONE-acetaminophen (ROXICET) 5-325 MG per tablet   Oral   Take 1 tablet by mouth every 6 (six) hours as needed for pain.   15 tablet   0     BP 162/113  Pulse 89  Temp(Src) 98.4 F (36.9 C) (Oral)  Resp 14  SpO2 98%  Physical Exam  Nursing note and vitals reviewed. Constitutional: He is oriented to person, place, and time. He appears well-developed.  HENT:  Head: Normocephalic and atraumatic.  Eyes: Conjunctivae and EOM are normal. Pupils are equal, round, and reactive to light.  Neck: Normal range of motion. Neck supple.  Cardiovascular: Normal rate and regular rhythm.    Pulmonary/Chest: Effort normal and breath sounds normal.  Abdominal: Soft. Bowel sounds are normal. He exhibits no distension. There is no tenderness. There is no rebound and no guarding.  Neurological: He is alert and oriented to person, place, and time. No cranial nerve deficit. Coordination normal.  Skin: Skin is warm.    ED Course  Procedures (including critical care time)  Labs Reviewed - No data to display No results found.   1. HTN (hypertension)   2. Back pain       MDM  Pt comes in with cc of HTN and back pain.  Back pain is chronic. No red flags associated with it. No need for imaging. Will give some analgesics.  HTN - uncontrolled due to non compliance with meds. On HCTZ. The BP is not severely high, and on hx and exam - there is no signs of end organ damage. No chest pains, no severe headaches, no visual complains - no indication for Trops, EKG etc.  Derwood Kaplan, MD 10/20/12 4098

## 2012-10-28 ENCOUNTER — Emergency Department (HOSPITAL_COMMUNITY)
Admission: EM | Admit: 2012-10-28 | Discharge: 2012-10-28 | Disposition: A | Discharge status: Home / Self Care | Payer: Medicare Other | Attending: Emergency Medicine | Admitting: Emergency Medicine

## 2012-10-28 ENCOUNTER — Encounter (HOSPITAL_COMMUNITY): Payer: Self-pay | Admitting: Emergency Medicine

## 2012-10-28 DIAGNOSIS — Z8739 Personal history of other diseases of the musculoskeletal system and connective tissue: Secondary | ICD-10-CM | POA: Insufficient documentation

## 2012-10-28 DIAGNOSIS — Z9889 Other specified postprocedural states: Secondary | ICD-10-CM | POA: Insufficient documentation

## 2012-10-28 DIAGNOSIS — Z87448 Personal history of other diseases of urinary system: Secondary | ICD-10-CM | POA: Insufficient documentation

## 2012-10-28 DIAGNOSIS — Z87891 Personal history of nicotine dependence: Secondary | ICD-10-CM | POA: Insufficient documentation

## 2012-10-28 DIAGNOSIS — I10 Essential (primary) hypertension: Secondary | ICD-10-CM | POA: Insufficient documentation

## 2012-10-28 DIAGNOSIS — Z7902 Long term (current) use of antithrombotics/antiplatelets: Secondary | ICD-10-CM | POA: Insufficient documentation

## 2012-10-28 DIAGNOSIS — M549 Dorsalgia, unspecified: Secondary | ICD-10-CM | POA: Insufficient documentation

## 2012-10-28 DIAGNOSIS — G8929 Other chronic pain: Secondary | ICD-10-CM

## 2012-10-28 DIAGNOSIS — J449 Chronic obstructive pulmonary disease, unspecified: Secondary | ICD-10-CM | POA: Insufficient documentation

## 2012-10-28 DIAGNOSIS — J4489 Other specified chronic obstructive pulmonary disease: Secondary | ICD-10-CM | POA: Insufficient documentation

## 2012-10-28 DIAGNOSIS — Z7982 Long term (current) use of aspirin: Secondary | ICD-10-CM | POA: Insufficient documentation

## 2012-10-28 HISTORY — DX: Dorsalgia, unspecified: M54.9

## 2012-10-28 HISTORY — DX: Other chronic pain: G89.29

## 2012-10-28 MED ORDER — OXYCODONE-ACETAMINOPHEN 5-325 MG PO TABS: 1.0000 | ORAL_TABLET | Freq: Four times a day (QID) | ORAL | Status: DC | PRN

## 2012-10-28 MED ORDER — ACETAMINOPHEN 325 MG PO TABS
650.0000 mg | ORAL_TABLET | Freq: Once | ORAL | Status: AC
  Administered 2012-10-28: 650 mg via ORAL
  Filled 2012-10-28: qty 2

## 2012-10-28 NOTE — ED Notes (Signed)
Pt reports he is very upset that he has been waiting so long when he is in "devastating back pain". Pt informed of wait times and staff apologized for having to keep him waiting. Pt given glass of water.

## 2012-10-28 NOTE — ED Notes (Signed)
C/o lower back pain since driving to [place] over the weekend.  States he has a history of chronic back pain.

## 2012-10-28 NOTE — ED Provider Notes (Signed)
History    This chart was scribed for non-physician practitioner, Fayrene Helper PA-C, working with Gwyneth Sprout, MD by Donne Anon, ED Scribe. This patient was seen in room TR07C/TR07C and the patient's care was started at 2033.  CSN: [id number] Arrival date & time 10/28/12  1948  First MD Initiated Contact with Patient 10/28/12 2033     Chief Complaint  Patient presents with  . Back Pain    HPI HPI Comments: Ryan Hopkins is a 62 y.o. male who presents to the Emergency Department complaining of 6 days of gradual onset, gradually worsening, chronic shooting back pain that radiates down both his calves and worsened over the weekend when he was on a long car ride. He states he previously had a L4 and L5 repaired in 2008 at [facility] and has had intermittent pain since then. Movement makes the pain worse.He denies fever, rash, incontinence, numbness, dizziness, lightheadedness or any other pain. He has an appointment next month to see his surgeon.  No prior hx of DVT or PE.  No c/o cp or sob.  Sts pain is primarily to his back from having to sit in the car for a long period of time.     Past Medical History  Diagnosis Date  . Hypertension   . COPD (chronic obstructive pulmonary disease)   . Emphysema   . Enlarged prostate   . Degenerative arthritis of spine   . Back pain, chronic    Past Surgical History  Procedure Laterality Date  . Back surgery      L4-L5  . Hernia repair     No family history on file. History  Substance Use Topics  . Smoking status: Former Games developer  . Smokeless tobacco: Not on file  . Alcohol Use: No    Review of Systems  Musculoskeletal: Positive for back pain.  Neurological: Negative for numbness.  All other systems reviewed and are negative.    Allergies  Vicodin  Home Medications   Current Outpatient Rx  Name  Route  Sig  Dispense  Refill  . albuterol (PROVENTIL HFA;VENTOLIN HFA) 108 (90 BASE) MCG/ACT inhaler   Inhalation   Inhale 2  puffs into the lungs every 6 (six) hours as needed. Shortness of breath         . aspirin EC 81 MG tablet   Oral   Take 81 mg by mouth daily.         . Fluticasone-Salmeterol (ADVAIR) 250-50 MCG/DOSE AEPB   Inhalation   Inhale 1 puff into the lungs every 12 (twelve) hours.         . hydrochlorothiazide (HYDRODIURIL) 25 MG tablet   Oral   Take 1 tablet (25 mg total) by mouth daily.   30 tablet   1   . ibuprofen (ADVIL,MOTRIN) 400 MG tablet   Oral   Take 1 tablet (400 mg total) by mouth every 6 (six) hours as needed for pain.   30 tablet   0   . oxyCODONE-acetaminophen (ROXICET) 5-325 MG per tablet   Oral   Take 1 tablet by mouth every 6 (six) hours as needed for pain.   15 tablet   0    BP 118/91  Pulse 109  Temp(Src) 98.1 F (36.7 C) (Oral)  Resp 18  SpO2 97%  Physical Exam  Nursing note and vitals reviewed. Constitutional: He appears well-developed and well-nourished. No distress.  HENT:  Head: Normocephalic and atraumatic.  Eyes: Conjunctivae are normal.  Neck: Neck supple.  No tracheal deviation present.  Cardiovascular: Normal rate and intact distal pulses.   Pulmonary/Chest: Effort normal. No respiratory distress.  Musculoskeletal: Normal range of motion. He exhibits no edema.  Tender to palpation over midline lumbar and paralumar region. Worsening pain with flexion and extension. No crepitance. No step offs. Negative straight leg raise bilaterally.  Neurological: He is alert.  Skin: Skin is warm and dry.  Well healed surgical scar. No rash. No abscess  Psychiatric: He has a normal mood and affect. His behavior is normal.    ED Course  Procedures (including critical care time) DIAGNOSTIC STUDIES: Oxygen Saturation is 97% on RA, adequate by my interpretation.    COORDINATION OF CARE: 10:15 PM Discussed treatment plan which includes pain medication with pt at bedside and pt agreed to plan. Discussed with pt the ED cannot prescribe narcotics for  chronic pain more than once.  Discussed that he will not receive narcotic pain medication from ER for future visits if related to chronic pain.  Do not think pt has DVT.     Labs Reviewed - No data to display No results found. 1. Chronic back pain     MDM  BP 131/94  Pulse 91  Temp(Src) 98.1 F (36.7 C) (Oral)  Resp 18  SpO2 98%   I personally performed the services described in this documentation, which was scribed in my presence. The recorded information has been reviewed and is accurate.     Fayrene Helper, PA-C 10/28/12 2314

## 2012-10-29 NOTE — ED Provider Notes (Signed)
Medical screening examination/treatment/procedure(s) were performed by non-physician practitioner and as supervising physician I was immediately available for consultation/collaboration.   Gwyneth Sprout, MD 10/29/12 [phone]

## 2013-01-16 ENCOUNTER — Emergency Department (HOSPITAL_COMMUNITY)
Admission: EM | Admit: 2013-01-16 | Discharge: 2013-01-16 | Disposition: A | Discharge status: Home / Self Care | Payer: Medicare Other | Attending: Emergency Medicine | Admitting: Emergency Medicine

## 2013-01-16 ENCOUNTER — Encounter (HOSPITAL_COMMUNITY): Payer: Self-pay | Admitting: *Deleted

## 2013-01-16 DIAGNOSIS — G894 Chronic pain syndrome: Secondary | ICD-10-CM | POA: Insufficient documentation

## 2013-01-16 DIAGNOSIS — Z79899 Other long term (current) drug therapy: Secondary | ICD-10-CM | POA: Insufficient documentation

## 2013-01-16 DIAGNOSIS — M545 Low back pain, unspecified: Secondary | ICD-10-CM | POA: Insufficient documentation

## 2013-01-16 DIAGNOSIS — J4489 Other specified chronic obstructive pulmonary disease: Secondary | ICD-10-CM | POA: Insufficient documentation

## 2013-01-16 DIAGNOSIS — Z7982 Long term (current) use of aspirin: Secondary | ICD-10-CM | POA: Insufficient documentation

## 2013-01-16 DIAGNOSIS — M479 Spondylosis, unspecified: Secondary | ICD-10-CM | POA: Insufficient documentation

## 2013-01-16 DIAGNOSIS — N400 Enlarged prostate without lower urinary tract symptoms: Secondary | ICD-10-CM | POA: Insufficient documentation

## 2013-01-16 DIAGNOSIS — Z87891 Personal history of nicotine dependence: Secondary | ICD-10-CM | POA: Insufficient documentation

## 2013-01-16 DIAGNOSIS — Z885 Allergy status to narcotic agent status: Secondary | ICD-10-CM | POA: Insufficient documentation

## 2013-01-16 DIAGNOSIS — J449 Chronic obstructive pulmonary disease, unspecified: Secondary | ICD-10-CM | POA: Insufficient documentation

## 2013-01-16 DIAGNOSIS — I10 Essential (primary) hypertension: Secondary | ICD-10-CM | POA: Insufficient documentation

## 2013-01-16 DIAGNOSIS — G8929 Other chronic pain: Secondary | ICD-10-CM

## 2013-01-16 DIAGNOSIS — J438 Other emphysema: Secondary | ICD-10-CM | POA: Insufficient documentation

## 2013-01-16 DIAGNOSIS — M542 Cervicalgia: Secondary | ICD-10-CM | POA: Insufficient documentation

## 2013-01-16 DIAGNOSIS — Z8719 Personal history of other diseases of the digestive system: Secondary | ICD-10-CM | POA: Insufficient documentation

## 2013-01-16 HISTORY — DX: Diverticulitis of intestine, part unspecified, without perforation or abscess without bleeding: K57.92

## 2013-01-16 MED ORDER — OXYCODONE-ACETAMINOPHEN 5-325 MG PO TABS
2.0000 | ORAL_TABLET | Freq: Once | ORAL | Status: AC
  Administered 2013-01-16: 2 via ORAL
  Filled 2013-01-16: qty 2

## 2013-01-16 NOTE — ED Provider Notes (Signed)
CSN: [id number]     Arrival date & time 01/16/13  1010 History  This chart was scribed for Rhea Bleacher, PA, working with Celene Kras, MD by Blanchard Kelch, ED Scribe. This patient was seen in room TR06C/TR06C and the patient's care was started at 10:40 AM.    Chief Complaint  Patient presents with  . Leg Pain  . Arm Pain    Patient is a 62 y.o. male presenting with leg pain and arm pain. The history is provided by the patient. No language interpreter was used.  Leg Pain Associated symptoms: back pain and neck pain   Associated symptoms: no fever   Arm Pain    HPI Comments: Ryan Hopkins is a 62 y.o. male with a history of chronic pain who presents to the Emergency Department complaining of constant, worsening back pain that radiates down the right leg and sometimes the left. He also has associated neck and arm pain. He denies any recent injuries that could have caused the worsening pain. He has been taking OTC motrin with no relief. He is prescribed Percocet for his chronic pain and reports the last time he used it was last week. Substance reporting database shows #120 Percocet 5-325 prescribed by Dub Amis on 12/31/2012. When asked why he is running short, patient states he's been doubling up on his pain medications recently because they have not been helping. He denies urinary or bowel incontinence, fevers, unexplained weight loss. He has an enlarged prostate. He also complains of a burning growth on left hip.    Past Medical History  Diagnosis Date  . Hypertension   . COPD (chronic obstructive pulmonary disease)   . Emphysema   . Enlarged prostate   . Degenerative arthritis of spine   . Back pain, chronic   . Diverticulitis    Past Surgical History  Procedure Laterality Date  . Back surgery      L4-L5  . Hernia repair     No family history on file. History  Substance Use Topics  . Smoking status: Former Games developer  . Smokeless tobacco: Not on file  . Alcohol Use: No     Review of Systems  Constitutional: Negative for fever and unexpected weight change.  HENT: Positive for neck pain.   Gastrointestinal: Negative for constipation.       Negative for bowel incontinence.  Genitourinary: Negative for hematuria, flank pain and difficulty urinating.       Negative for urinary incontinence  Musculoskeletal: Positive for back pain and arthralgias.  Neurological: Negative for weakness and numbness.       Negative for saddle paresthesias     Allergies  Vicodin  Home Medications   Current Outpatient Rx  Name  Route  Sig  Dispense  Refill  . albuterol (PROVENTIL HFA;VENTOLIN HFA) 108 (90 BASE) MCG/ACT inhaler   Inhalation   Inhale 2 puffs into the lungs every 6 (six) hours as needed. Shortness of breath         . aspirin EC 81 MG tablet   Oral   Take 81 mg by mouth daily.         . Fluticasone-Salmeterol (ADVAIR) 250-50 MCG/DOSE AEPB   Inhalation   Inhale 1 puff into the lungs every 12 (twelve) hours.         . hydrochlorothiazide (HYDRODIURIL) 25 MG tablet   Oral   Take 1 tablet (25 mg total) by mouth daily.   30 tablet   1   . ibuprofen (ADVIL,MOTRIN)  400 MG tablet   Oral   Take 1 tablet (400 mg total) by mouth every 6 (six) hours as needed for pain.   30 tablet   0   . oxyCODONE-acetaminophen (ROXICET) 5-325 MG per tablet   Oral   Take 1 tablet by mouth every 6 (six) hours as needed for pain.   15 tablet   0    Triage Vitals: BP 126/92  Pulse 93  Temp(Src) 99.2 F (37.3 C) (Oral)  Resp 16  SpO2 97%  Physical Exam  Nursing note and vitals reviewed. Constitutional: He appears well-developed and well-nourished.  HENT:  Head: Normocephalic and atraumatic.  Eyes: Conjunctivae are normal.  Neck: Normal range of motion.  Abdominal: Soft. There is no tenderness. There is no CVA tenderness.  Musculoskeletal: Normal range of motion.       Cervical back: He exhibits tenderness. He exhibits normal range of motion and no bony  tenderness.       Thoracic back: Normal.       Lumbar back: He exhibits tenderness. He exhibits normal range of motion and no bony tenderness.  No step-off noted with palpation of spine.   Neurological: He is alert. He has normal reflexes. No sensory deficit. He exhibits normal muscle tone.  5/5 strength in entire lower extremities bilaterally. No sensation deficit.   Skin: Skin is warm and dry.  Psychiatric: He has a normal mood and affect.    ED Course  Procedures (including critical care time)  DIAGNOSTIC STUDIES: Oxygen Saturation is 97% on room air, adequate by my interpretation.    COORDINATION OF CARE:  10:46 AM -Will order Percocet for the pain. Patient verbalizes understanding and agrees with treatment plan.   Vital signs reviewed and are as follows: Filed Vitals:   01/16/13 1030  BP: 126/92  Pulse: 93  Temp: 99.2 F (37.3 C)  Resp: 16   Patient informed that I would be unable to refill his chronic pain medications here, as he has been receiving this monthly by his primary care physician. I urged patient to followup with his primary care physician to discuss his chronic pain medication use.  No red flag s/s of low back pain. Patient was counseled on back pain precautions and told to do activity as tolerated but do not lift, push, or pull heavy objects more than 10 pounds for the next week.  Patient counseled to use ice or heat on back for no longer than 15 minutes every hour.   Patient urged to follow-up with PCP if pain does not improve with treatment and rest or if pain becomes recurrent. Urged to return with worsening severe pain, loss of bowel or bladder control, trouble walking.   The patient verbalizes understanding and agrees with the plan.   Labs Review Labs Reviewed - No data to display Imaging Review No results found.  MDM   1. Chronic pain    Patient with acute on chronic neck and back pain. Pain is same as previous pain. He admits to misusing his  chronic narcotic pain medication. No neurological deficits. Patient is ambulatory. No warning symptoms of back pain including: loss of bowel or bladder control, night sweats, waking from sleep with back pain, unexplained fevers or weight loss, h/o cancer, IVDU, recent trauma. No concern for cauda equina, epidural abscess, or other serious cause of back pain. Conservative measures such as rest, ice/heat and pain medicine indicated with PCP follow-up if no improvement with conservative management.    I personally  performed the services described in this documentation, which was scribed in my presence. The recorded information has been reviewed and is accurate.    Renne Crigler, PA-C 01/16/13 [phone]

## 2013-01-16 NOTE — ED Notes (Signed)
Pt with hx of chronic pain r/t degenerative arthritis of the spine to ED stating that it is getting worse and pain is not controlled by ibuprofen.  Denies increased activity.

## 2013-01-16 NOTE — ED Notes (Signed)
Patient advised chronic pain.  Patient states "I usually take percocet and ibuprofen, but I'm out of both".   Patient also complains of lump near L groin area.

## 2013-01-18 NOTE — ED Provider Notes (Signed)
Medical screening examination/treatment/procedure(s) were performed by non-physician practitioner and as supervising physician I was immediately available for consultation/collaboration.    Mandi Mattioli R Reese Stockman, MD 01/18/13 1634 

## 2013-03-16 ENCOUNTER — Ambulatory Visit: Payer: Self-pay | Admitting: Podiatry

## 2013-03-28 ENCOUNTER — Ambulatory Visit: Payer: Self-pay | Admitting: Podiatry

## 2013-05-08 ENCOUNTER — Telehealth: Payer: Self-pay | Admitting: *Deleted

## 2013-05-08 NOTE — Telephone Encounter (Signed)
I WAS CALLING TO SEE IF I COULD GET AN EARLIER APPOINTMENT ON TOMORROW WITH DR. HYATT.  MY APPOINTMENT IS SCHEDULED FOR 3:45PM.  I HAD CALLED AND SPOKE TO SOMEONE PREVIOUSLY.  I ADVISED SOMEONE WOULD CALL HIM TOMORROW.

## 2013-05-09 ENCOUNTER — Ambulatory Visit: Payer: Medicare Other | Admitting: Podiatry

## 2013-05-09 NOTE — Telephone Encounter (Signed)
Rescheduled patient to early am appt

## 2013-05-18 ENCOUNTER — Ambulatory Visit: Payer: Medicare Other | Admitting: Podiatry

## 2013-05-25 ENCOUNTER — Ambulatory Visit (INDEPENDENT_AMBULATORY_CARE_PROVIDER_SITE_OTHER): Payer: Medicare Other | Admitting: Podiatry

## 2013-05-25 ENCOUNTER — Encounter (HOSPITAL_COMMUNITY): Payer: Self-pay | Admitting: Emergency Medicine

## 2013-05-25 ENCOUNTER — Encounter: Payer: Self-pay | Admitting: Podiatry

## 2013-05-25 ENCOUNTER — Emergency Department (HOSPITAL_COMMUNITY): Payer: No Typology Code available for payment source

## 2013-05-25 ENCOUNTER — Emergency Department (HOSPITAL_COMMUNITY)
Admission: EM | Admit: 2013-05-25 | Discharge: 2013-05-25 | Disposition: A | Discharge status: Home / Self Care | Payer: No Typology Code available for payment source | Attending: Emergency Medicine | Admitting: Emergency Medicine

## 2013-05-25 VITALS — BP 114/85 | HR 104 | Resp 18

## 2013-05-25 DIAGNOSIS — S79929A Unspecified injury of unspecified thigh, initial encounter: Secondary | ICD-10-CM

## 2013-05-25 DIAGNOSIS — S0993XA Unspecified injury of face, initial encounter: Secondary | ICD-10-CM | POA: Insufficient documentation

## 2013-05-25 DIAGNOSIS — S79919A Unspecified injury of unspecified hip, initial encounter: Secondary | ICD-10-CM | POA: Insufficient documentation

## 2013-05-25 DIAGNOSIS — S46909A Unspecified injury of unspecified muscle, fascia and tendon at shoulder and upper arm level, unspecified arm, initial encounter: Secondary | ICD-10-CM | POA: Insufficient documentation

## 2013-05-25 DIAGNOSIS — Z87891 Personal history of nicotine dependence: Secondary | ICD-10-CM | POA: Insufficient documentation

## 2013-05-25 DIAGNOSIS — Z7901 Long term (current) use of anticoagulants: Secondary | ICD-10-CM | POA: Insufficient documentation

## 2013-05-25 DIAGNOSIS — Y92410 Unspecified street and highway as the place of occurrence of the external cause: Secondary | ICD-10-CM | POA: Insufficient documentation

## 2013-05-25 DIAGNOSIS — Z87448 Personal history of other diseases of urinary system: Secondary | ICD-10-CM | POA: Insufficient documentation

## 2013-05-25 DIAGNOSIS — M25512 Pain in left shoulder: Secondary | ICD-10-CM

## 2013-05-25 DIAGNOSIS — I10 Essential (primary) hypertension: Secondary | ICD-10-CM | POA: Insufficient documentation

## 2013-05-25 DIAGNOSIS — Z79899 Other long term (current) drug therapy: Secondary | ICD-10-CM | POA: Insufficient documentation

## 2013-05-25 DIAGNOSIS — M545 Low back pain, unspecified: Secondary | ICD-10-CM

## 2013-05-25 DIAGNOSIS — Z7982 Long term (current) use of aspirin: Secondary | ICD-10-CM | POA: Insufficient documentation

## 2013-05-25 DIAGNOSIS — S4980XA Other specified injuries of shoulder and upper arm, unspecified arm, initial encounter: Secondary | ICD-10-CM | POA: Insufficient documentation

## 2013-05-25 DIAGNOSIS — S0990XA Unspecified injury of head, initial encounter: Secondary | ICD-10-CM | POA: Insufficient documentation

## 2013-05-25 DIAGNOSIS — G8929 Other chronic pain: Secondary | ICD-10-CM | POA: Insufficient documentation

## 2013-05-25 DIAGNOSIS — S199XXA Unspecified injury of neck, initial encounter: Secondary | ICD-10-CM

## 2013-05-25 DIAGNOSIS — M25552 Pain in left hip: Secondary | ICD-10-CM

## 2013-05-25 DIAGNOSIS — M79609 Pain in unspecified limb: Secondary | ICD-10-CM

## 2013-05-25 DIAGNOSIS — Z8719 Personal history of other diseases of the digestive system: Secondary | ICD-10-CM | POA: Insufficient documentation

## 2013-05-25 DIAGNOSIS — M479 Spondylosis, unspecified: Secondary | ICD-10-CM | POA: Insufficient documentation

## 2013-05-25 DIAGNOSIS — J438 Other emphysema: Secondary | ICD-10-CM | POA: Insufficient documentation

## 2013-05-25 DIAGNOSIS — B351 Tinea unguium: Secondary | ICD-10-CM

## 2013-05-25 DIAGNOSIS — IMO0002 Reserved for concepts with insufficient information to code with codable children: Secondary | ICD-10-CM | POA: Insufficient documentation

## 2013-05-25 DIAGNOSIS — M79606 Pain in leg, unspecified: Secondary | ICD-10-CM

## 2013-05-25 DIAGNOSIS — Q828 Other specified congenital malformations of skin: Secondary | ICD-10-CM

## 2013-05-25 DIAGNOSIS — Y9389 Activity, other specified: Secondary | ICD-10-CM | POA: Insufficient documentation

## 2013-05-25 MED ORDER — OXYCODONE-ACETAMINOPHEN 5-325 MG PO TABS: 1.0000 | ORAL_TABLET | ORAL | Status: DC | PRN

## 2013-05-25 MED ORDER — DIAZEPAM 5 MG PO TABS
5.0000 mg | ORAL_TABLET | Freq: Once | ORAL | Status: AC
  Administered 2013-05-25: 5 mg via ORAL
  Filled 2013-05-25: qty 1

## 2013-05-25 MED ORDER — DIAZEPAM 5 MG PO TABS: 5.0000 mg | ORAL_TABLET | Freq: Three times a day (TID) | ORAL | Status: DC | PRN

## 2013-05-25 MED ORDER — OXYCODONE-ACETAMINOPHEN 5-325 MG PO TABS
1.0000 | ORAL_TABLET | Freq: Once | ORAL | Status: AC
  Administered 2013-05-25: 1 via ORAL
  Filled 2013-05-25: qty 1

## 2013-05-25 NOTE — Progress Notes (Signed)
[facility] did not get to see patient but will be sending information about the [facility], using the address provided.
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
## 2013-05-25 NOTE — ED Notes (Signed)
Bed: WA11 Expected date:  Expected time:  Means of arrival:  Comments: 
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
## 2013-05-25 NOTE — Progress Notes (Signed)
Pt states to get my calluses and toenails trimmed.  Objective: Vital signs are stable alert and oriented x3. Pulses are palpable bilateral. Nails are thick yellow dystrophic clinically mycotic reactive hyperkeratosis present bilateral foot.  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 Pain in limb secondary to onychomycosis bilateral.  Plan: Debridement of nails 1 through 5 bilateral is cover service secondary to pain. Debridement of all reactive hyperkeratosis bilateral. Wrote him a note for bed rest for the next month. Was requesting narcotics and none were given.

## 2013-08-22 ENCOUNTER — Ambulatory Visit: Payer: Medicare Other | Admitting: Podiatry

## 2013-08-23 ENCOUNTER — Ambulatory Visit (INDEPENDENT_AMBULATORY_CARE_PROVIDER_SITE_OTHER): Payer: Medicare Other | Admitting: Podiatrist

## 2013-08-23 ENCOUNTER — Encounter: Payer: Self-pay | Admitting: Podiatrist

## 2013-08-23 VITALS — BP 147/95 | HR 103 | Resp 16

## 2013-08-23 DIAGNOSIS — B351 Tinea unguium: Secondary | ICD-10-CM

## 2013-08-23 DIAGNOSIS — Q828 Other specified congenital malformations of skin: Secondary | ICD-10-CM

## 2013-08-23 DIAGNOSIS — M79606 Pain in leg, unspecified: Secondary | ICD-10-CM

## 2013-08-23 DIAGNOSIS — M79609 Pain in unspecified limb: Secondary | ICD-10-CM

## 2013-08-23 NOTE — Progress Notes (Signed)
Chief Complaint  Patient presents with  . Debridement    trim toenails  . Callouses    trim calluses sub 1st and 5th MPJ right and sub 5th MPJ and 1st medial toe left     HPI: Patient is 63 y.o. male who presents today for foot and nail care.  Patient relates pain due to toenails and lesions.       Physical Exam GENERAL APPEARANCE: Alert, conversant. Appropriately groomed. No acute distress.  VASCULAR: Pedal pulses palpable at 2/4 DP and PT bilateral.  Capillary refill time is immediate to all digits,  Proximal to distal cooling it warm to warm.  Digital hair growth is present bilateral  NEUROLOGIC: sensation is intact epicritically and protectively to 5.07 monofilament at 5/5 sites bilateral.  Light touch is intact bilateral, vibratory sensation intact bilateral, achilles tendon reflex is intact bilateral.  MUSCULOSKELETAL: acceptable muscle strength, tone and stability bilateral.  Intrinsic muscluature intact bilateral.  Rectus appearance of foot and digits noted bilateral.   DERMATOLOGIC: porokeratotic lesions present submet 5 bilateral, left hallux medial side, right 2nd , nails are mycotic Assessment:  Plan:debridement of nails and lesions, without complication. Patient will return in 3 months or as needed for follow up

## 2013-09-26 ENCOUNTER — Emergency Department (HOSPITAL_COMMUNITY)
Admission: EM | Admit: 2013-09-26 | Discharge: 2013-09-26 | Disposition: A | Discharge status: Home / Self Care | Payer: Medicare Other | Attending: Emergency Medicine | Admitting: Emergency Medicine

## 2013-09-26 ENCOUNTER — Encounter (HOSPITAL_COMMUNITY): Payer: Self-pay | Admitting: Emergency Medicine

## 2013-09-26 DIAGNOSIS — M479 Spondylosis, unspecified: Secondary | ICD-10-CM | POA: Insufficient documentation

## 2013-09-26 DIAGNOSIS — R632 Polyphagia: Secondary | ICD-10-CM | POA: Insufficient documentation

## 2013-09-26 DIAGNOSIS — I10 Essential (primary) hypertension: Secondary | ICD-10-CM | POA: Insufficient documentation

## 2013-09-26 DIAGNOSIS — N289 Disorder of kidney and ureter, unspecified: Secondary | ICD-10-CM | POA: Insufficient documentation

## 2013-09-26 DIAGNOSIS — R109 Unspecified abdominal pain: Secondary | ICD-10-CM | POA: Insufficient documentation

## 2013-09-26 DIAGNOSIS — Z8719 Personal history of other diseases of the digestive system: Secondary | ICD-10-CM | POA: Insufficient documentation

## 2013-09-26 DIAGNOSIS — G8929 Other chronic pain: Secondary | ICD-10-CM | POA: Insufficient documentation

## 2013-09-26 DIAGNOSIS — Z9889 Other specified postprocedural states: Secondary | ICD-10-CM | POA: Insufficient documentation

## 2013-09-26 DIAGNOSIS — E1149 Type 2 diabetes mellitus with other diabetic neurological complication: Secondary | ICD-10-CM | POA: Insufficient documentation

## 2013-09-26 DIAGNOSIS — E1140 Type 2 diabetes mellitus with diabetic neuropathy, unspecified: Secondary | ICD-10-CM

## 2013-09-26 DIAGNOSIS — K5900 Constipation, unspecified: Secondary | ICD-10-CM | POA: Insufficient documentation

## 2013-09-26 DIAGNOSIS — R52 Pain, unspecified: Secondary | ICD-10-CM

## 2013-09-26 DIAGNOSIS — Z79899 Other long term (current) drug therapy: Secondary | ICD-10-CM | POA: Insufficient documentation

## 2013-09-26 DIAGNOSIS — J438 Other emphysema: Secondary | ICD-10-CM | POA: Insufficient documentation

## 2013-09-26 DIAGNOSIS — Z7982 Long term (current) use of aspirin: Secondary | ICD-10-CM | POA: Insufficient documentation

## 2013-09-26 DIAGNOSIS — IMO0002 Reserved for concepts with insufficient information to code with codable children: Secondary | ICD-10-CM | POA: Insufficient documentation

## 2013-09-26 DIAGNOSIS — E1142 Type 2 diabetes mellitus with diabetic polyneuropathy: Secondary | ICD-10-CM | POA: Insufficient documentation

## 2013-09-26 DIAGNOSIS — Z87891 Personal history of nicotine dependence: Secondary | ICD-10-CM | POA: Insufficient documentation

## 2013-09-26 DIAGNOSIS — N400 Enlarged prostate without lower urinary tract symptoms: Secondary | ICD-10-CM | POA: Insufficient documentation

## 2013-09-26 HISTORY — DX: Type 2 diabetes mellitus without complications: E11.9

## 2013-09-26 LAB — COMPREHENSIVE METABOLIC PANEL
ALT: 19 U/L (ref 0–53)
AST: 22 U/L (ref 0–37)
Albumin: 4.8 g/dL (ref 3.5–5.2)
Alkaline Phosphatase: 68 U/L (ref 39–117)
BILIRUBIN TOTAL: 0.5 mg/dL (ref 0.3–1.2)
BUN: 27 mg/dL — ABNORMAL HIGH (ref 6–23)
CHLORIDE: 100 meq/L (ref 96–112)
CO2: 22 meq/L (ref 19–32)
CREATININE: 1.46 mg/dL — AB (ref 0.50–1.35)
Calcium: 10.2 mg/dL (ref 8.4–10.5)
GFR calc Af Amer: 57 mL/min — ABNORMAL LOW (ref >90)
GFR calc non Af Amer: 49 mL/min — ABNORMAL LOW (ref >90)
Glucose, Bld: 129 mg/dL — ABNORMAL HIGH (ref 70–99)
POTASSIUM: 4 meq/L (ref 3.7–5.3)
Sodium: 138 mEq/L (ref 137–147)
Total Protein: 8.2 g/dL (ref 6.0–8.3)

## 2013-09-26 LAB — CBC
HCT: 47.1 % (ref 39.0–52.0)
Hemoglobin: 17.2 g/dL — ABNORMAL HIGH (ref 13.0–17.0)
MCH: 32.6 pg (ref 26.0–34.0)
MCHC: 36.5 g/dL — ABNORMAL HIGH (ref 30.0–36.0)
MCV: 89.2 fL (ref 78.0–100.0)
PLATELETS: 270 10*3/uL (ref 150–400)
RBC: 5.28 MIL/uL (ref 4.22–5.81)
RDW: 14 % (ref 11.5–15.5)
WBC: 10.1 10*3/uL (ref 4.0–10.5)

## 2013-09-26 LAB — URINALYSIS, ROUTINE W REFLEX MICROSCOPIC
BILIRUBIN URINE: NEGATIVE
GLUCOSE, UA: NEGATIVE mg/dL
HGB URINE DIPSTICK: NEGATIVE
Ketones, ur: NEGATIVE mg/dL
Leukocytes, UA: NEGATIVE
Nitrite: NEGATIVE
PROTEIN: NEGATIVE mg/dL
Specific Gravity, Urine: 1.029 (ref 1.005–1.030)
UROBILINOGEN UA: 0.2 mg/dL (ref 0.0–1.0)
pH: 5 (ref 5.0–8.0)

## 2013-09-26 LAB — CBG MONITORING, ED: GLUCOSE-CAPILLARY: 132 mg/dL — AB (ref 70–99)

## 2013-09-26 MED ORDER — ONDANSETRON HCL 4 MG PO TABS: 4.0000 mg | ORAL_TABLET | Freq: Four times a day (QID) | ORAL | Status: DC

## 2013-09-26 MED ORDER — SODIUM CHLORIDE 0.9 % IV BOLUS (SEPSIS)
1000.0000 mL | Freq: Once | INTRAVENOUS | Status: AC
  Administered 2013-09-26: 1000 mL via INTRAVENOUS

## 2013-09-26 MED ORDER — ONDANSETRON HCL 4 MG/2ML IJ SOLN
4.0000 mg | Freq: Once | INTRAMUSCULAR | Status: AC
  Administered 2013-09-26: 4 mg via INTRAVENOUS
  Filled 2013-09-26: qty 2

## 2013-09-26 MED ORDER — OXYCODONE-ACETAMINOPHEN 5-325 MG PO TABS: 1.0000 | ORAL_TABLET | Freq: Four times a day (QID) | ORAL | Status: DC | PRN

## 2013-09-26 MED ORDER — MORPHINE SULFATE 4 MG/ML IJ SOLN
4.0000 mg | Freq: Once | INTRAMUSCULAR | Status: AC
  Administered 2013-09-26: 4 mg via INTRAVENOUS
  Filled 2013-09-26: qty 1

## 2013-09-26 NOTE — Discharge Instructions (Signed)
Type 2 Diabetes Mellitus, Adult Type 2 diabetes mellitus, often simply referred to as type 2 diabetes, is a long-lasting (chronic) disease. In type 2 diabetes, the pancreas does not make enough insulin (a hormone), the cells are less responsive to the insulin that is made (insulin resistance), or both. Normally, insulin moves sugars from food into the tissue cells. The tissue cells use the sugars for energy. The lack of insulin or the lack of normal response to insulin causes excess sugars to build up in the blood instead of going into the tissue cells. As a result, high blood sugar (hyperglycemia) develops. The effect of high sugar (glucose) levels can cause many complications. Type 2 diabetes was also previously called adult-onset diabetes but it can occur at any age.  RISK FACTORS  A person is predisposed to developing type 2 diabetes if someone in the family has the disease and also has one or more of the following primary risk factors:  Overweight.  An inactive lifestyle.  A history of consistently eating high-calorie foods. Maintaining a normal weight and regular physical activity can reduce the chance of developing type 2 diabetes. SYMPTOMS  A person with type 2 diabetes may not show symptoms initially. The symptoms of type 2 diabetes appear slowly. The symptoms include:  Increased thirst (polydipsia).  Increased urination (polyuria).  Increased urination during the night (nocturia).  Weight loss. This weight loss may be rapid.  Frequent, recurring infections.  Tiredness (fatigue).  Weakness.  Vision changes, such as blurred vision.  Fruity smell to your breath.  Abdominal pain.  Nausea or vomiting.  Cuts or bruises which are slow to heal.  Tingling or numbness in the hands or feet. DIAGNOSIS Type 2 diabetes is frequently not diagnosed until complications of diabetes are present. Type 2 diabetes is diagnosed when symptoms or complications are present and when blood  glucose levels are increased. Your blood glucose level may be checked by one or more of the following blood tests:  A fasting blood glucose test. You will not be allowed to eat for at least 8 hours before a blood sample is taken.  A random blood glucose test. Your blood glucose is checked at any time of the day regardless of when you ate.  A hemoglobin A1c blood glucose test. A hemoglobin A1c test provides information about blood glucose control over the previous 3 months.  An oral glucose tolerance test (OGTT). Your blood glucose is measured after you have not eaten (fasted) for 2 hours and then after you drink a glucose-containing beverage. TREATMENT   You may need to take insulin or diabetes medicine daily to keep blood glucose levels in the desired range.  You will need to match insulin dosing with exercise and healthy food choices. The treatment goal is to maintain the before meal blood sugar (preprandial glucose) level at 70 130 mg/dL. HOME CARE INSTRUCTIONS   Have your hemoglobin A1c level checked twice a year.  Perform daily blood glucose monitoring as directed by your caregiver.  Monitor urine ketones when you are ill and as directed by your caregiver.  Take your diabetes medicine or insulin as directed by your caregiver to maintain your blood glucose levels in the desired range.  Never run out of diabetes medicine or insulin. It is needed every day.  Adjust insulin based on your intake of carbohydrates. Carbohydrates can raise blood glucose levels but need to be included in your diet. Carbohydrates provide vitamins, minerals, and fiber which are an essential part of  a healthy diet. Carbohydrates are found in fruits, vegetables, whole grains, dairy products, legumes, and foods containing added sugars.    Eat healthy foods. Alternate 3 meals with 3 snacks.  Lose weight if overweight.  Carry a medical alert card or wear your medical alert jewelry.  Carry a 15 gram  carbohydrate snack with you at all times to treat low blood glucose (hypoglycemia). Some examples of 15 gram carbohydrate snacks include:  Glucose tablets, 3 or 4   Glucose gel, 15 gram tube  Raisins, 2 tablespoons (24 grams)  Jelly beans, 6  Animal crackers, 8  Regular pop, 4 ounces (120 mL)  Gummy treats, 9  Recognize hypoglycemia. Hypoglycemia occurs with blood glucose levels of 70 mg/dL and below. The risk for hypoglycemia increases when fasting or skipping meals, during or after intense exercise, and during sleep. Hypoglycemia symptoms can include:  Tremors or shakes.  Decreased ability to concentrate.  Sweating.  Increased heart rate.  Headache.  Dry mouth.  Hunger.  Irritability.  Anxiety.  Restless sleep.  Altered speech or coordination.  Confusion.  Treat hypoglycemia promptly. If you are alert and able to safely swallow, follow the 15:15 rule:  Take 15 20 grams of rapid-acting glucose or carbohydrate. Rapid-acting options include glucose gel, glucose tablets, or 4 ounces (120 mL) of fruit juice, regular soda, or low fat milk.  Check your blood glucose level 15 minutes after taking the glucose.  Take 15 20 grams more of glucose if the repeat blood glucose level is still 70 mg/dL or below.  Eat a meal or snack within 1 hour once blood glucose levels return to normal.    Be alert to polyuria and polydipsia which are early signs of hyperglycemia. An early awareness of hyperglycemia allows for prompt treatment. Treat hyperglycemia as directed by your caregiver.  Engage in at least 150 minutes of moderate-intensity physical activity a week, spread over at least 3 days of the week or as directed by your caregiver. In addition, you should engage in resistance exercise at least 2 times a week or as directed by your caregiver.  Adjust your medicine and food intake as needed if you start a new exercise or sport.  Follow your sick day plan at any time you  are unable to eat or drink as usual.  Avoid tobacco use.  Limit alcohol intake to no more than 1 drink per day for nonpregnant women and 2 drinks per day for men. You should drink alcohol only when you are also eating food. Talk with your caregiver whether alcohol is safe for you. Tell your caregiver if you drink alcohol several times a week.  Follow up with your caregiver regularly.  Schedule an eye exam soon after the diagnosis of type 2 diabetes and then annually.  Perform daily skin and foot care. Examine your skin and feet daily for cuts, bruises, redness, nail problems, bleeding, blisters, or sores. A foot exam by a caregiver should be done annually.  Brush your teeth and gums at least twice a day and floss at least once a day. Follow up with your dentist regularly.  Share your diabetes management plan with your workplace or school.  Stay up-to-date with immunizations.  Learn to manage stress.  Obtain ongoing diabetes education and support as needed.  Participate in, or seek rehabilitation as needed to maintain or improve independence and quality of life. Request a physical or occupational therapy referral if you are having foot or hand numbness or difficulties with grooming,  dressing, eating, or physical activity. SEEK MEDICAL CARE IF:   You are unable to eat food or drink fluids for more than 6 hours.  You have nausea and vomiting for more than 6 hours.  Your blood glucose level is over 240 mg/dL.  There is a change in mental status.  You develop an additional serious illness.  You have diarrhea for more than 6 hours.  You have been sick or have had a fever for a couple of days and are not getting better.  You have pain during any physical activity.  SEEK IMMEDIATE MEDICAL CARE IF:  You have difficulty breathing.  You have moderate to large ketone levels. MAKE SURE YOU:  Understand these instructions.  Will watch your condition.  Will get help right away if  you are not doing well or get worse. Document Released: 04/20/2005 Document Revised: 01/13/2012 Document Reviewed: 11/17/2011 [facility] Patient Information 2014 [employer].  Diabetes Meal Planning Guide The diabetes meal planning guide is a tool to help you plan your meals and snacks. It is important for people with diabetes to manage their blood glucose (sugar) levels. Choosing the right foods and the right amounts throughout your day will help control your blood glucose. Eating right can even help you improve your blood pressure and reach or maintain a healthy weight. CARBOHYDRATE COUNTING MADE EASY When you eat carbohydrates, they turn to sugar. This raises your blood glucose level. Counting carbohydrates can help you control this level so you feel better. When you plan your meals by counting carbohydrates, you can have more flexibility in what you eat and balance your medicine with your food intake. Carbohydrate counting simply means adding up the total amount of carbohydrate grams in your meals and snacks. Try to eat about the same amount at each meal. Foods with carbohydrates are listed below. Each portion below is 1 carbohydrate serving or 15 grams of carbohydrates. Ask your dietician how many grams of carbohydrates you should eat at each meal or snack. Grains and Starches  1 slice bread.   English muffin or hotdog/hamburger bun.   cup cold cereal (unsweetened).   cup cooked pasta or rice.   cup starchy vegetables (corn, potatoes, peas, beans, winter squash).  1 tortilla (6 inches).   bagel.  1 waffle or pancake (size of a CD).   cup cooked cereal.  4 to 6 small crackers. *Whole grain is recommended. Fruit  1 cup fresh unsweetened berries, melon, papaya, pineapple.  1 small fresh fruit.   banana or mango.   cup fruit juice (4 oz unsweetened).   cup canned fruit in natural juice or water.  2 tbs dried fruit.  12 to 15 grapes or cherries. Milk and  Yogurt  1 cup fat-free or 1% milk.  1 cup soy milk.  6 oz light yogurt with sugar-free sweetener.  6 oz low-fat soy yogurt.  6 oz plain yogurt. Vegetables  1 cup raw or  cup cooked is counted as 0 carbohydrates or a "free" food.  If you eat 3 or more servings at 1 meal, count them as 1 carbohydrate serving. Other Carbohydrates   oz chips or pretzels.   cup ice cream or frozen yogurt.   cup sherbet or sorbet.  2 inch square cake, no frosting.  1 tbs honey, sugar, jam, jelly, or syrup.  2 small cookies.  3 squares of graham crackers.  3 cups popcorn.  6 crackers.  1 cup broth-based soup.  Count 1 cup casserole or  other mixed foods as 2 carbohydrate servings.  Foods with less than 20 calories in a serving may be counted as 0 carbohydrates or a "free" food. You may want to purchase a book or computer software that lists the carbohydrate gram counts of different foods. In addition, the nutrition facts panel on the labels of the foods you eat are a good source of this information. The label will tell you how big the serving size is and the total number of carbohydrate grams you will be eating per serving. Divide this number by 15 to obtain the number of carbohydrate servings in a portion. Remember, 1 carbohydrate serving equals 15 grams of carbohydrate. SERVING SIZES Measuring foods and serving sizes helps you make sure you are getting the right amount of food. The list below tells how big or small some common serving sizes are.  1 oz.........4 stacked dice.  3 oz........Marland KitchenDeck of cards.  1 tsp.......Marland KitchenTip of little finger.  1 tbs......Marland KitchenMarland KitchenThumb.  2 tbs.......Marland KitchenGolf ball.   cup......Marland KitchenHalf of a fist.  1 cup.......Marland KitchenA fist. SAMPLE DIABETES MEAL PLAN Below is a sample meal plan that includes foods from the grain and starches, dairy, vegetable, fruit, and meat groups. A dietician can individualize a meal plan to fit your calorie needs and tell you the number of  servings needed from each food group. However, controlling the total amount of carbohydrates in your meal or snack is more important than making sure you include all of the food groups at every meal. You may interchange carbohydrate containing foods (dairy, starches, and fruits). The meal plan below is an example of a 2000 calorie diet using carbohydrate counting. This meal plan has 17 carbohydrate servings. Breakfast  1 cup oatmeal (2 carb servings).   cup light yogurt (1 carb serving).  1 cup blueberries (1 carb serving).   cup almonds. Snack  1 large apple (2 carb servings).  1 low-fat string cheese stick. Lunch  Chicken breast salad.  1 cup spinach.   cup chopped tomatoes.  2 oz chicken breast, sliced.  2 tbs low-fat [place] dressing.  12 whole-wheat crackers (2 carb servings).  12 to 15 grapes (1 carb serving).  1 cup low-fat milk (1 carb serving). Snack  1 cup carrots.   cup hummus (1 carb serving). Dinner  3 oz broiled salmon.  1 cup brown rice (3 carb servings). Snack  1  cups steamed broccoli (1 carb serving) drizzled with 1 tsp olive oil and lemon juice.  1 cup light pudding (2 carb servings). DIABETES MEAL PLANNING WORKSHEET Your dietician can use this worksheet to help you decide how many servings of foods and what types of foods are right for you.  BREAKFAST Food Group and Servings / Carb Servings Grain/Starches __________________________________ Dairy __________________________________________ Vegetable ______________________________________ Fruit ___________________________________________ Meat __________________________________________ Fat ____________________________________________ LUNCH Food Group and Servings / Carb Servings Grain/Starches ___________________________________ Dairy ___________________________________________ Fruit ____________________________________________ Meat ___________________________________________ Fat  _____________________________________________ W[employer] Food Group and Servings / Carb Servings Grain/Starches ___________________________________ Dairy ___________________________________________ Fruit ____________________________________________ Meat ___________________________________________ Fat _____________________________________________ SNACKS Food Group and Servings / Carb Servings Grain/Starches ___________________________________ Dairy ___________________________________________ Vegetable _______________________________________ Fruit ____________________________________________ Meat ___________________________________________ Fat _____________________________________________ DAILY TOTALS Starches _________________________ Vegetable ________________________ Fruit ____________________________ Dairy ____________________________ Meat ____________________________ Fat ______________________________ Document Released: 01/15/2005 Document Revised: 07/13/2011 Document Reviewed: 11/26/2008 [employer] Patient Information 2014 [employer].  Diabetic Neuropathy Diabetic neuropathy is a nerve disease or nerve damage that is caused by diabetes mellitus. About half of all people with diabetes mellitus have some form of nerve damage. Nerve  damage is more common in those who have had diabetes mellitus for many years and who generally have not had good control of their blood sugar (glucose) level. Diabetic neuropathy is a common complication of diabetes mellitus. There are three more common types of diabetic neuropathy and a fourth type that is less common and less understood:   Peripheral neuropathy This is the most common type of diabetic neuropathy. It causes damage to the nerves of the feet and legs first and then eventually the hands and arms.The damage affects the ability to sense touch.  Autonomic neuropathy This type causes damage to the autonomic nervous system, which controls the  following functions:  Heartbeat.  Body temperature.  Blood pressure.  Urination.  Digestion.  Sweating.  Sexual function.  Focal neuropathy Focal neuropathy can be painful and unpredictable and occurs most often in older adults with diabetes mellitus. It involves a specific nerve or one area and often comes on suddenly. It usually does not cause long-term problems.  Radiculoplexus neuropathy Sometimes called lumbosacral radiculoplexus neuropathy, radiculoplexus neuropathy affects the nerves of the thighs, hips, buttocks, or legs. It is more common in people with type 2 diabetes mellitus and in older men. It is characterized by debilitating pain, weakness, and atrophy, usually in the thigh muscles. CAUSES  The cause of peripheral, autonomic, and focal neuropathies is diabetes mellitus that is uncontrolled and high glucose levels. The cause of radiculoplexus neuropathy is unknown. However, it is thought to be caused by inflammation related to uncontrolled glucose levels. SIGNS AND SYMPTOMS  Peripheral Neuropathy Peripheral neuropathy develops slowly over time. When the nerves of the feet and legs no longer work there may be:   Burning, stabbing, or aching pain in the legs or feet.  Inability to feel pressure or pain in your feet. This can lead to:  Thick calluses over pressure areas.  Pressure sores.  Ulcers.  Foot deformities.  Reduced ability to feel temperature changes.  Muscle weakness. Autonomic Neuropathy The symptoms of autonomic neuropathy vary depending on which nerves are affected. Symptoms may include:  Problems with digestion, such as:  Feeling sick to your stomach (nausea).  Vomiting.  Bloating.  Constipation.  Diarrhea.  Abdominal pain.  Difficulty with urination. This occurs if you lose your ability to sense when your bladder is full. Problems include:  Urine leakage (incontinence).  Inability to empty your bladder completely  (retention).  Rapid or irregular heartbeat (palpitations).  Blood pressure drops when you stand up (orthostatic hypotension). When you stand up you may feel:  Dizzy.  Weak.  Faint.  In men, inability to attain and maintain an erection.  In women, vaginal dryness and problems with decreased sexual desire and arousal.  Problems with body temperature regulation.  Increased or decreased sweating. Focal Neuropathy  Abnormal eye movements or abnormal alignment of both eyes.  Weakness in the wrist.  Foot drop. This results in an inability to lift the foot properly and abnormal walking or foot movement.  Paralysis on one side of your face (Bell palsy).  Chest or abdominal pain. Radiculoplexus Neuropathy  Sudden, severe pain in your hip, thigh, or buttocks.  Weakness and wasting of thigh muscles.  Difficulty rising from a seated position.  Abdominal swelling.  Unexplained weight loss (usually more than 10 lb [4.5 kg]). DIAGNOSIS  Peripheral Neuropathy Your senses may be tested. Sensory function testing can be done with:  A light touch using a monofilament.  A vibration with tuning fork.  A sharp sensation with a  pin prick. Other tests that can help diagnose neuropathy are:  Nerve conduction velocity. This test checks the transmission of an electrical current through a nerve.  Electromyography. This shows how muscles respond to electrical signals transmitted by nearby nerves.  Quantitative sensory testing. This is used to assess how your nerves respond to vibrations and changes in temperature. Autonomic Neuropathy Diagnosis is often based on reported symptoms. Tell your health care provider if you experience:   Dizziness.   Constipation.   Diarrhea.   Inappropriate urination or inability to urinate.   Inability to get or maintain an erection.  Tests that may be done include:   Electrocardiography or Holter monitor. These are tests that can help show  problems with the heart rate or heart rhythm.   An X-ray exam may be done. Focal Neuropathy Diagnosis is made based on your symptoms and what your health care provider finds during your exam. Other tests may be done. They may include:  Nerve conduction velocities. This checks the transmission of electrical current through a nerve.  Electromyography. This shows how muscles respond to electrical signals transmitted by nearby nerves.  Quantitative sensory testing. This test is used to assess how your nerves respond to vibration and changes in temperature. Radiculoplexus Neuropathy  Often the first thing is to eliminate any other issue or problems that might be the cause, as there is no stick test for diagnosis.  X-ray exam of your spine and lumbar region.  Spinal tap to rule out cancer.  MRI to rule out other lesions. TREATMENT  Once nerve damage occurs, it cannot be reversed. The goal of treatment is to keep the disease or nerve damage from getting worse and affecting more nerve fibers. Controlling your blood glucose level is the key. Most people with radiculoplexus neuropathy see at least a partial improvement over time. You will need to keep your blood glucose and HbA1c levels in the target range determined by your health care provider. Things that help control blood glucose levels include:   Blood glucose monitoring.   Meal planning.   Physical activity.   Diabetes medicine.  Over time, maintaining lower blood glucose levels helps lessen symptoms. Sometimes, prescription pain medicine is needed. HOME CARE INSTRUCTIONS:  Do not smoke.  Keep your blood glucose level in the range that you and your health care provider have determined acceptable for you.  Keep your blood pressure level in the range that you and your health care provider have determined acceptable for you.  Eat a well-balanced diet.  Be active every day.  Check your feet every day. SEEK MEDICAL CARE IF:    You have burning, stabbing, or aching pain in the legs or feet.  You are unable to feel pressure or pain in your feet.  You develop problems with digestion such as:  Nausea.  Vomiting.  Bloating.  Constipation.  Diarrhea.  Abdominal pain.  You have difficulty with urination, such as:  Incontinence.  Retention.  You have palpitations.  You develop orthostatic hypotension. When you stand up you may feel:  Dizzy.  Weak.  Faint.  You cannot attain and maintain an erection (in men).  You have vaginal dryness and problems with decreased sexual desire and arousal (in women).  You have severe pain in your thighs, legs, or buttocks.  You have unexplained weight loss. Document Released: 06/29/2001 Document Revised: 02/08/2013 Document Reviewed: 09/29/2012 [facility] Patient Information 2014 [employer].

## 2013-09-26 NOTE — ED Notes (Signed)
Removed pts iv. Told pt to get dressed for discharge and his nurse would be at bedside with discharge paper work in a few minutes.

## 2013-09-26 NOTE — ED Notes (Signed)
Per pt sts he was dx with diabetes 1 week ago and still having lower abdominal pain , leg pain, feet pain and blurry vision when he waked up. sts he has been taking metformin. Pt sts he is suppose to see the doctor again on the 2nd of June. sts he hasn't checked his CBG.

## 2013-09-26 NOTE — ED Provider Notes (Signed)
CSN: [id number]     Arrival date & time 09/26/13  1158 History   First MD Initiated Contact with Patient 09/26/13 1252     Chief Complaint  Patient presents with  . Abdominal Pain  . Leg Pain  . Foot Pain     (Consider location/radiation/quality/duration/timing/severity/associated sxs/prior Treatment) HPI Comments: Patient is a 63 year old male with history of hypertension, COPD, enlarged prostate, chronic back pain, diverticulitis, diabetes who presents today with gradually worsening pain. He reports that the pain starts in his toes and radiates up his legs and his abdomen. This pain has been ongoing for years, but worsened in the past month. He states that again his pain worsened in the past week. 2 weeks ago he was started on Lipitor and metformin. He was recently diagnosed with diabetes. He has associated constipation, polydipsia, polyphagia. He has been trying to eat a diabetic diet, but does not like many of the foods he has tried. He does not like water and has been trying to drink diet sodas. He denies any nausea, vomiting, diarrhea.  Patient is a 63 y.o. male presenting with abdominal pain, leg pain, and lower extremity pain. The history is provided by the patient. No language interpreter was used.  Abdominal Pain Associated symptoms: no chest pain, no chills, no fever, no nausea, no shortness of breath and no vomiting   Leg Pain Associated symptoms: no fever   Foot Pain Associated symptoms include abdominal pain and myalgias. Pertinent negatives include no chest pain, chills, fever, nausea or vomiting.    Past Medical History  Diagnosis Date  . Hypertension   . COPD (chronic obstructive pulmonary disease)   . Emphysema   . Enlarged prostate   . Degenerative arthritis of spine   . Back pain, chronic   . Diverticulitis   . Diabetes mellitus without complication    Past Surgical History  Procedure Laterality Date  . Back surgery      L4-L5  . Hernia repair     History  reviewed. No pertinent family history. History  Substance Use Topics  . Smoking status: Former Research scientist (life sciences)  . Smokeless tobacco: Not on file  . Alcohol Use: No    Review of Systems  Constitutional: Negative for fever and chills.  Respiratory: Negative for shortness of breath.   Cardiovascular: Negative for chest pain.  Gastrointestinal: Positive for abdominal pain. Negative for nausea and vomiting.  Endocrine: Positive for polydipsia and polyphagia. Negative for polyuria.  Musculoskeletal: Positive for myalgias.  All other systems reviewed and are negative.     Allergies  Vicodin  Home Medications   Prior to Admission medications   Medication Sig Start Date End Date Taking? Authorizing Provider  albuterol (PROVENTIL HFA;VENTOLIN HFA) 108 (90 BASE) MCG/ACT inhaler Inhale 2 puffs into the lungs every 6 (six) hours as needed. Shortness of breath   Yes Historical Provider, MD  aspirin EC 81 MG tablet Take 81 mg by mouth daily.   Yes Historical Provider, MD  Fluticasone-Salmeterol (ADVAIR) 250-50 MCG/DOSE AEPB Inhale 1 puff into the lungs every 12 (twelve) hours.   Yes Historical Provider, MD  lisinopril-hydrochlorothiazide (PRINZIDE,ZESTORETIC) 20-12.5 MG per tablet Take 1 tablet by mouth daily.   Yes Historical Provider, MD  metFORMIN (GLUCOPHAGE) 500 MG tablet Take 500 mg by mouth daily with breakfast.   Yes Historical Provider, MD  oxyCODONE-acetaminophen (PERCOCET/ROXICET) 5-325 MG per tablet Take 1 tablet by mouth every 4 (four) hours as needed for severe pain.   Yes Historical Provider, MD  tamsulosin (  FLOMAX) 0.4 MG CAPS capsule Take 0.4 mg by mouth daily.   Yes Historical Provider, MD   BP 130/89  Pulse 86  Temp(Src) 99 F (37.2 C) (Oral)  Resp 16  SpO2 96% Physical Exam  Nursing note and vitals reviewed. Constitutional: He is oriented to person, place, and time. He appears well-developed and well-nourished. No distress.  HENT:  Head: Normocephalic and atraumatic.  Right  Ear: External ear normal.  Left Ear: External ear normal.  Nose: Nose normal.  Mouth/Throat: Mucous membranes are dry.  Eyes: Conjunctivae are normal.  Neck: Normal range of motion. No tracheal deviation present.  Cardiovascular: Normal rate, regular rhythm, normal heart sounds, intact distal pulses and normal pulses.   Pulses:      Radial pulses are 2+ on the right side, and 2+ on the left side.       Dorsalis pedis pulses are 2+ on the right side, and 2+ on the left side.       Posterior tibial pulses are 2+ on the right side, and 2+ on the left side.  Capillary refill < 3 seconds in all toes.   Pulmonary/Chest: Effort normal and breath sounds normal. No stridor.  Abdominal: Soft. He exhibits no distension. There is no tenderness. There is no rigidity and no guarding.  Musculoskeletal: Normal range of motion.  Strength 5/5 in all extremities.   Neurological: He is alert and oriented to person, place, and time. He has normal strength.  Mildly antalgic gait.   Skin: Skin is warm and dry. He is not diaphoretic.  Psychiatric: He has a normal mood and affect. His behavior is normal.    ED Course  Procedures (including critical care time) Labs Review Labs Reviewed  CBC - Abnormal; Notable for the following:    Hemoglobin 17.2 (*)    MCHC 36.5 (*)    All other components within normal limits  COMPREHENSIVE METABOLIC PANEL - Abnormal; Notable for the following:    Glucose, Bld 129 (*)    BUN 27 (*)    Creatinine, Ser 1.46 (*)    GFR calc non Af Amer 49 (*)    GFR calc Af Amer 57 (*)    All other components within normal limits  CBG MONITORING, ED - Abnormal; Notable for the following:    Glucose-Capillary 132 (*)    All other components within normal limits  URINALYSIS, ROUTINE W REFLEX MICROSCOPIC    Imaging Review No results found.   EKG Interpretation None      MDM   Final diagnoses:  Diabetic neuropathy  Renal insufficiency  Pain   Patient presents to ED for  pain x years, worse over the past few weeks. Recent diagnosis of diabetes. Patient struggling with diabetic diet. He does appear mildly dehydrated on exam. Labs consistent with this. He feels significantly improved after fluid bolus, morphine, and zofran. Discussed renal insufficiency with patient. He has follow up appointment with his PCP in a week. He will keep this appointment. Information of diabetic diet given. Return instructions given. Vital signs stable for discharge. Discussed case with Dr. Zenia Resides who agrees with plan. Patient / Family / Caregiver informed of clinical course, understand medical decision-making process, and agree with plan.   Elwyn Lade, PA-C 09/27/13 1137

## 2013-09-26 NOTE — ED Notes (Signed)
Pt hooked up to phillips monitor. Pt being monitored by pulse ox and blood pressure.

## 2013-09-26 NOTE — ED Notes (Signed)
Pt ambulates without distress.  

## 2013-09-27 NOTE — ED Provider Notes (Signed)
Medical screening examination/treatment/procedure(s) were performed by non-physician practitioner and as supervising physician I was immediately available for consultation/collaboration.  Eesha Schmaltz T Jazz Rogala, MD 09/27/13 1734 

## 2013-11-01 DIAGNOSIS — C61 Malignant neoplasm of prostate: Secondary | ICD-10-CM

## 2013-11-01 HISTORY — DX: Malignant neoplasm of prostate: C61

## 2013-11-20 ENCOUNTER — Other Ambulatory Visit (HOSPITAL_COMMUNITY): Payer: Self-pay | Admitting: Urology

## 2013-11-20 DIAGNOSIS — C61 Malignant neoplasm of prostate: Secondary | ICD-10-CM

## 2013-11-23 ENCOUNTER — Ambulatory Visit (INDEPENDENT_AMBULATORY_CARE_PROVIDER_SITE_OTHER): Payer: Medicare Other | Admitting: Podiatrist

## 2013-11-23 ENCOUNTER — Encounter: Payer: Self-pay | Admitting: Podiatrist

## 2013-11-23 DIAGNOSIS — E1142 Type 2 diabetes mellitus with diabetic polyneuropathy: Secondary | ICD-10-CM

## 2013-11-23 DIAGNOSIS — M79606 Pain in leg, unspecified: Secondary | ICD-10-CM

## 2013-11-23 DIAGNOSIS — M216X9 Other acquired deformities of unspecified foot: Secondary | ICD-10-CM

## 2013-11-23 DIAGNOSIS — B351 Tinea unguium: Secondary | ICD-10-CM

## 2013-11-23 DIAGNOSIS — E1149 Type 2 diabetes mellitus with other diabetic neurological complication: Secondary | ICD-10-CM

## 2013-11-23 DIAGNOSIS — M79609 Pain in unspecified limb: Secondary | ICD-10-CM

## 2013-11-23 DIAGNOSIS — E1140 Type 2 diabetes mellitus with diabetic neuropathy, unspecified: Secondary | ICD-10-CM

## 2013-11-23 DIAGNOSIS — Q828 Other specified congenital malformations of skin: Secondary | ICD-10-CM

## 2013-11-23 NOTE — Progress Notes (Signed)
Patient presents for follow up of foot and nail care.  Relates pain from the toenails and calluses.   Physical Exam  GENERAL APPEARANCE: Alert, conversant. Appropriately groomed. No acute distress.  VASCULAR: Pedal pulses palpable at 2/4 DP and PT bilateral. Capillary refill time is immediate to all digits, Proximal to distal cooling it warm to warm. Digital hair growth is present bilateral  NEUROLOGIC: sensation is intact epicritically and protectively to 5.07 monofilament at 5/5 sites bilateral. Light touch is intact bilateral, vibratory sensation intact bilateral, achilles tendon reflex is intact bilateral.  MUSCULOSKELETAL: acceptable muscle strength, tone and stability bilateral. Intrinsic muscluature intact bilateral. Rectus appearance of foot and digits noted bilateral.  DERMATOLOGIC: porokeratotic lesions present submet 5 bilateral, left hallux medial side, right 2nd , nails x 10 are elongated, thick, discolored, distrophic and are mycotic   Assessment:  Plan:debridement of nails and lesions, without complication. Patient will return in 3 months or as needed for follow up  

## 2013-11-23 NOTE — Patient Instructions (Signed)
Diabetes and Foot Care Diabetes may cause you to have problems because of poor blood supply (circulation) to your feet and legs. This may cause the skin on your feet to become thinner, break easier, and heal more slowly. Your skin may become dry, and the skin may peel and crack. You may also have nerve damage in your legs and feet causing decreased feeling in them. You may not notice minor injuries to your feet that could lead to infections or more serious problems. Taking care of your feet is one of the most important things you can do for yourself.  HOME CARE INSTRUCTIONS  Wear shoes at all times, even in the house. Do not go barefoot. Bare feet are easily injured.  Check your feet daily for blisters, cuts, and redness. If you cannot see the bottom of your feet, use a mirror or ask someone for help.  Wash your feet with warm water (do not use hot water) and mild soap. Then pat your feet and the areas between your toes until they are completely dry. Do not soak your feet as this can dry your skin.  Apply a moisturizing lotion or petroleum jelly (that does not contain alcohol and is unscented) to the skin on your feet and to dry, brittle toenails. Do not apply lotion between your toes.  Trim your toenails straight across. Do not dig under them or around the cuticle. File the edges of your nails with an emery board or nail file.  Do not cut corns or calluses or try to remove them with medicine.  Wear clean socks or stockings every day. Make sure they are not too tight. Do not wear knee-high stockings since they may decrease blood flow to your legs.  Wear shoes that fit properly and have enough cushioning. To break in new shoes, wear them for just a few hours a day. This prevents you from injuring your feet. Always look in your shoes before you put them on to be sure there are no objects inside.  Do not cross your legs. This may decrease the blood flow to your feet.  If you find a minor scrape,  cut, or break in the skin on your feet, keep it and the skin around it clean and dry. These areas may be cleansed with mild soap and water. Do not cleanse the area with peroxide, alcohol, or iodine.  When you remove an adhesive bandage, be sure not to damage the skin around it.  If you have a wound, look at it several times a day to make sure it is healing.  Do not use heating pads or hot water bottles. They may burn your skin. If you have lost feeling in your feet or legs, you may not know it is happening until it is too late.  Make sure your health care provider performs a complete foot exam at least annually or more often if you have foot problems. Report any cuts, sores, or bruises to your health care provider immediately. SEEK MEDICAL CARE IF:   You have an injury that is not healing.  You have cuts or breaks in the skin.  You have an ingrown nail.  You notice redness on your legs or feet.  You feel burning or tingling in your legs or feet.  You have pain or cramps in your legs and feet.  Your legs or feet are numb.  Your feet always feel cold. SEEK IMMEDIATE MEDICAL CARE IF:   There is increasing redness,   swelling, or pain in or around a wound.  There is a red line that goes up your leg.  Pus is coming from a wound.  You develop a fever or as directed by your health care provider.  You notice a bad smell coming from an ulcer or wound. Document Released: 04/17/2000 Document Revised: 12/21/2012 Document Reviewed: 09/27/2012 [employer] Patient Information 2015 [employer]. This information is not intended to replace advice given to you by your health care provider. Make sure you discuss any questions you have with your health care provider.  

## 2013-11-26 ENCOUNTER — Emergency Department (HOSPITAL_COMMUNITY)
Admission: EM | Admit: 2013-11-26 | Discharge: 2013-11-26 | Disposition: A | Discharge status: Home / Self Care | Payer: Medicare Other | Attending: Emergency Medicine | Admitting: Emergency Medicine

## 2013-11-26 ENCOUNTER — Encounter (HOSPITAL_COMMUNITY): Payer: Self-pay | Admitting: Emergency Medicine

## 2013-11-26 ENCOUNTER — Emergency Department (HOSPITAL_COMMUNITY): Payer: Medicare Other

## 2013-11-26 DIAGNOSIS — Z859 Personal history of malignant neoplasm, unspecified: Secondary | ICD-10-CM | POA: Insufficient documentation

## 2013-11-26 DIAGNOSIS — Z87448 Personal history of other diseases of urinary system: Secondary | ICD-10-CM | POA: Insufficient documentation

## 2013-11-26 DIAGNOSIS — Z7982 Long term (current) use of aspirin: Secondary | ICD-10-CM | POA: Insufficient documentation

## 2013-11-26 DIAGNOSIS — I10 Essential (primary) hypertension: Secondary | ICD-10-CM | POA: Insufficient documentation

## 2013-11-26 DIAGNOSIS — E119 Type 2 diabetes mellitus without complications: Secondary | ICD-10-CM | POA: Diagnosis not present

## 2013-11-26 DIAGNOSIS — Z79899 Other long term (current) drug therapy: Secondary | ICD-10-CM | POA: Insufficient documentation

## 2013-11-26 DIAGNOSIS — G8929 Other chronic pain: Secondary | ICD-10-CM | POA: Diagnosis not present

## 2013-11-26 DIAGNOSIS — R5381 Other malaise: Secondary | ICD-10-CM | POA: Insufficient documentation

## 2013-11-26 DIAGNOSIS — IMO0001 Reserved for inherently not codable concepts without codable children: Secondary | ICD-10-CM | POA: Insufficient documentation

## 2013-11-26 DIAGNOSIS — J438 Other emphysema: Secondary | ICD-10-CM | POA: Insufficient documentation

## 2013-11-26 DIAGNOSIS — R1032 Left lower quadrant pain: Secondary | ICD-10-CM | POA: Insufficient documentation

## 2013-11-26 DIAGNOSIS — Z8739 Personal history of other diseases of the musculoskeletal system and connective tissue: Secondary | ICD-10-CM | POA: Insufficient documentation

## 2013-11-26 DIAGNOSIS — R112 Nausea with vomiting, unspecified: Secondary | ICD-10-CM | POA: Diagnosis not present

## 2013-11-26 DIAGNOSIS — Z87891 Personal history of nicotine dependence: Secondary | ICD-10-CM | POA: Diagnosis not present

## 2013-11-26 DIAGNOSIS — Z8546 Personal history of malignant neoplasm of prostate: Secondary | ICD-10-CM | POA: Diagnosis not present

## 2013-11-26 DIAGNOSIS — R5383 Other fatigue: Secondary | ICD-10-CM | POA: Diagnosis not present

## 2013-11-26 DIAGNOSIS — R300 Dysuria: Secondary | ICD-10-CM | POA: Diagnosis not present

## 2013-11-26 DIAGNOSIS — Z8719 Personal history of other diseases of the digestive system: Secondary | ICD-10-CM | POA: Diagnosis not present

## 2013-11-26 HISTORY — DX: Malignant neoplasm of prostate: C61

## 2013-11-26 LAB — COMPREHENSIVE METABOLIC PANEL
ALT: 13 U/L (ref 0–53)
AST: 16 U/L (ref 0–37)
Albumin: 4 g/dL (ref 3.5–5.2)
Alkaline Phosphatase: 75 U/L (ref 39–117)
Anion gap: 18 — ABNORMAL HIGH (ref 5–15)
BUN: 17 mg/dL (ref 6–23)
CO2: 22 meq/L (ref 19–32)
CREATININE: 1.09 mg/dL (ref 0.50–1.35)
Calcium: 9.1 mg/dL (ref 8.4–10.5)
Chloride: 96 mEq/L (ref 96–112)
GFR, EST AFRICAN AMERICAN: 80 mL/min — AB (ref >90)
GFR, EST NON AFRICAN AMERICAN: 69 mL/min — AB (ref >90)
GLUCOSE: 132 mg/dL — AB (ref 70–99)
Potassium: 3.6 mEq/L — ABNORMAL LOW (ref 3.7–5.3)
Sodium: 136 mEq/L — ABNORMAL LOW (ref 137–147)
Total Bilirubin: 0.5 mg/dL (ref 0.3–1.2)
Total Protein: 7.5 g/dL (ref 6.0–8.3)

## 2013-11-26 LAB — CBC WITH DIFFERENTIAL/PLATELET
BASOS ABS: 0 10*3/uL (ref 0.0–0.1)
Basophils Relative: 0 % (ref 0–1)
EOS PCT: 0 % (ref 0–5)
Eosinophils Absolute: 0 10*3/uL (ref 0.0–0.7)
HCT: 43.2 % (ref 39.0–52.0)
Hemoglobin: 15.3 g/dL (ref 13.0–17.0)
LYMPHS PCT: 13 % (ref 12–46)
Lymphs Abs: 1.7 10*3/uL (ref 0.7–4.0)
MCH: 31.3 pg (ref 26.0–34.0)
MCHC: 35.4 g/dL (ref 30.0–36.0)
MCV: 88.3 fL (ref 78.0–100.0)
MONO ABS: 1 10*3/uL (ref 0.1–1.0)
Monocytes Relative: 8 % (ref 3–12)
Neutro Abs: 10.3 10*3/uL — ABNORMAL HIGH (ref 1.7–7.7)
Neutrophils Relative %: 79 % — ABNORMAL HIGH (ref 43–77)
PLATELETS: 286 10*3/uL (ref 150–400)
RBC: 4.89 MIL/uL (ref 4.22–5.81)
RDW: 13.9 % (ref 11.5–15.5)
WBC: 13 10*3/uL — ABNORMAL HIGH (ref 4.0–10.5)

## 2013-11-26 LAB — URINALYSIS, ROUTINE W REFLEX MICROSCOPIC
Bilirubin Urine: NEGATIVE
Glucose, UA: NEGATIVE mg/dL
Hgb urine dipstick: NEGATIVE
KETONES UR: NEGATIVE mg/dL
LEUKOCYTES UA: NEGATIVE
Nitrite: NEGATIVE
PH: 5.5 (ref 5.0–8.0)
Protein, ur: NEGATIVE mg/dL
SPECIFIC GRAVITY, URINE: 1.012 (ref 1.005–1.030)
Urobilinogen, UA: 0.2 mg/dL (ref 0.0–1.0)

## 2013-11-26 LAB — I-STAT CG4 LACTIC ACID, ED: Lactic Acid, Venous: 2.07 mmol/L (ref 0.5–2.2)

## 2013-11-26 LAB — D-DIMER, QUANTITATIVE (NOT AT ARMC): D DIMER QUANT: 0.33 ug{FEU}/mL (ref 0.00–0.48)

## 2013-11-26 LAB — LIPASE, BLOOD: LIPASE: 45 U/L (ref 11–59)

## 2013-11-26 MED ORDER — OXYCODONE-ACETAMINOPHEN 5-325 MG PO TABS: 2.0000 | ORAL_TABLET | ORAL | Status: DC | PRN

## 2013-11-26 MED ORDER — ONDANSETRON HCL 4 MG PO TABS: 4.0000 mg | ORAL_TABLET | Freq: Four times a day (QID) | ORAL | Status: DC

## 2013-11-26 MED ORDER — MORPHINE SULFATE 4 MG/ML IJ SOLN
4.0000 mg | Freq: Once | INTRAMUSCULAR | Status: AC
  Administered 2013-11-26: 4 mg via INTRAVENOUS
  Filled 2013-11-26: qty 1

## 2013-11-26 MED ORDER — IOHEXOL 300 MG/ML  SOLN
100.0000 mL | Freq: Once | INTRAMUSCULAR | Status: AC | PRN
  Administered 2013-11-26: 100 mL via INTRAVENOUS

## 2013-11-26 MED ORDER — ONDANSETRON HCL 4 MG/2ML IJ SOLN
4.0000 mg | Freq: Once | INTRAMUSCULAR | Status: AC
  Administered 2013-11-26: 4 mg via INTRAVENOUS
  Filled 2013-11-26: qty 2

## 2013-11-26 MED ORDER — IOHEXOL 300 MG/ML  SOLN: 25.0000 mL | Freq: Once | INTRAMUSCULAR | Status: DC | PRN

## 2013-11-26 MED ORDER — CHLORPROMAZINE HCL 25 MG PO TABS
25.0000 mg | ORAL_TABLET | Freq: Once | ORAL | Status: AC
  Administered 2013-11-26: 25 mg via ORAL
  Filled 2013-11-26: qty 1

## 2013-11-26 MED ORDER — SODIUM CHLORIDE 0.9 % IV BOLUS (SEPSIS)
1000.0000 mL | Freq: Once | INTRAVENOUS | Status: AC
  Administered 2013-11-26: 1000 mL via INTRAVENOUS

## 2013-11-26 NOTE — ED Notes (Signed)
Lactic Acid given to Dr. Wyvonnia Dusky 2.07 mmol/L

## 2013-11-26 NOTE — Discharge Instructions (Signed)
Abdominal Pain Your testing is negative for serious cause of your abdominal pain. Follow up with your doctor. Return to the ED if you develop new or worsening symptoms. Many things can cause abdominal pain. Usually, abdominal pain is not caused by a disease and will improve without treatment. It can often be observed and treated at home. Your health care provider will do a physical exam and possibly order blood tests and X-rays to help determine the seriousness of your pain. However, in many cases, more time must pass before a clear cause of the pain can be found. Before that point, your health care provider may not know if you need more testing or further treatment. HOME CARE INSTRUCTIONS  Monitor your abdominal pain for any changes. The following actions may help to alleviate any discomfort you are experiencing:  Only take over-the-counter or prescription medicines as directed by your health care provider.  Do not take laxatives unless directed to do so by your health care provider.  Try a clear liquid diet (broth, tea, or water) as directed by your health care provider. Slowly move to a bland diet as tolerated. SEEK MEDICAL CARE IF:  You have unexplained abdominal pain.  You have abdominal pain associated with nausea or diarrhea.  You have pain when you urinate or have a bowel movement.  You experience abdominal pain that wakes you in the night.  You have abdominal pain that is worsened or improved by eating food.  You have abdominal pain that is worsened with eating fatty foods.  You have a fever. SEEK IMMEDIATE MEDICAL CARE IF:   Your pain does not go away within 2 hours.  You keep throwing up (vomiting).  Your pain is felt only in portions of the abdomen, such as the right side or the left lower portion of the abdomen.  You pass bloody or black tarry stools. MAKE SURE YOU:  Understand these instructions.   Will watch your condition.   Will get help right away if you  are not doing well or get worse.  Document Released: 01/28/2005 Document Revised: 04/25/2013 Document Reviewed: 12/28/2012 [facility] Patient Information 2015 [place], [place]. This information is not intended to replace advice given to you by your health care provider. Make sure you discuss any questions you have with your health care provider.

## 2013-11-26 NOTE — ED Notes (Signed)
Pt sts he has had abd pain in LLQ that increases with movement. Pt last BM was yesterday. Pt sts he was dx with prostate cancer 2 weeks ago. Pt sts he has been very depressed since diagnosis. Pt denies wanting to harm himself. Pt sts he is not happy anymore and does not want to be self destructive.

## 2013-11-26 NOTE — ED Provider Notes (Signed)
CSN: [id number]     Arrival date & time 11/26/13  0815 History   First MD Initiated Contact with Patient 11/26/13 [phone]     Chief Complaint  Patient presents with  . Abdominal Pain     (Consider location/radiation/quality/duration/timing/severity/associated sxs/prior Treatment) HPI Comments: Patient presents with a three-day history of left-sided lower quadrant pain is worse with palpation and movement. Last bowel movement yesterday. One episode of clear vomiting this morning. He states this pain is constant, nothing makes it better. He's never had it before. He does have a history of diverticulitis. Denies any fever, chills, chest pain. Patient states he was recently diagnosed with prostate cancer and a biopsy 10 days ago. He's been very depressed but not suicidal or homicidal. He also complains of pain in his bilateral legs consistent with his diabetic neuropathy. No focal weakness. No bowel and bladder incontinence. No change in pattern chronic pattern of legs.  The history is provided by the patient.    Past Medical History  Diagnosis Date  . Hypertension   . COPD (chronic obstructive pulmonary disease)   . Emphysema   . Enlarged prostate   . Degenerative arthritis of spine   . Back pain, chronic   . Diverticulitis   . Diabetes mellitus without complication   . Cancer   . Prostate cancer 11/2013   Past Surgical History  Procedure Laterality Date  . Back surgery      L4-L5  . Hernia repair     History reviewed. No pertinent family history. History  Substance Use Topics  . Smoking status: Former Research scientist (life sciences)  . Smokeless tobacco: Not on file  . Alcohol Use: No    Review of Systems  Constitutional: Positive for activity change, appetite change and fatigue. Negative for fever.  HENT: Negative for congestion and rhinorrhea.   Respiratory: Negative for cough, chest tightness and shortness of breath.   Cardiovascular: Negative for chest pain.  Gastrointestinal: Positive for  nausea, vomiting and abdominal pain. Negative for constipation.  Genitourinary: Positive for dysuria.  Musculoskeletal: Positive for arthralgias and myalgias.  Skin: Negative for rash.  Neurological: Positive for weakness. Negative for dizziness and headaches.  A complete 10 system review of systems was obtained and all systems are negative except as noted in the HPI and PMH.      Allergies  Vicodin  Home Medications   Prior to Admission medications   Medication Sig Start Date End Date Taking? Authorizing Provider  albuterol (PROVENTIL HFA;VENTOLIN HFA) 108 (90 BASE) MCG/ACT inhaler Inhale 2 puffs into the lungs every 6 (six) hours as needed for wheezing or shortness of breath.    Yes Historical Provider, MD  aspirin EC 81 MG tablet Take 81 mg by mouth daily.   Yes Historical Provider, MD  atorvastatin (LIPITOR) 20 MG tablet Take 20 mg by mouth daily.   Yes Historical Provider, MD  escitalopram (LEXAPRO) 10 MG tablet Take 10 mg by mouth daily.   Yes Historical Provider, MD  Fluticasone-Salmeterol (ADVAIR) 250-50 MCG/DOSE AEPB Inhale 1 puff into the lungs every 12 (twelve) hours.   Yes Historical Provider, MD  lisinopril-hydrochlorothiazide (PRINZIDE,ZESTORETIC) 20-12.5 MG per tablet Take 1 tablet by mouth daily.   Yes Historical Provider, MD  metFORMIN (GLUCOPHAGE) 500 MG tablet Take 500 mg by mouth daily with breakfast.   Yes Historical Provider, MD  ondansetron (ZOFRAN) 4 MG tablet Take 1 tablet (4 mg total) by mouth every 6 (six) hours. 09/26/13  Yes Elwyn Lade, PA-C  oxyCODONE-acetaminophen (PERCOCET/ROXICET) [phone]  MG per tablet Take 1 tablet by mouth every 4 (four) hours as needed for severe pain.   Yes Historical Provider, MD  PRESCRIPTION MEDICATION Take 1 tablet by mouth 2 (two) times daily. Steroid medication from [facility]   Yes Historical Provider, MD  tamsulosin (FLOMAX) 0.4 MG CAPS capsule Take 0.4 mg by mouth daily.   Yes Historical Provider, MD  ondansetron  (ZOFRAN) 4 MG tablet Take 1 tablet (4 mg total) by mouth every 6 (six) hours. 11/26/13   [name], MD  oxyCODONE-acetaminophen (PERCOCET/ROXICET) 5-325 MG per tablet Take 2 tablets by mouth every 4 (four) hours as needed for severe pain. 11/26/13   [name], MD   BP 130/92  Pulse 78  Temp(Src) 97.6 F (36.4 C) (Oral)  Resp 15  Ht 5\' 8"  (1.727 m)  Wt 220 lb (99.791 kg)  BMI 33.46 kg/m2  SpO2 100% Physical Exam  Nursing note and vitals reviewed. Constitutional: He is oriented to person, place, and time. He appears well-developed and well-nourished. No distress.  tearful  HENT:  Head: Normocephalic and atraumatic.  Mouth/Throat: Oropharynx is clear and moist. No oropharyngeal exudate.  Eyes: Conjunctivae and EOM are normal. Pupils are equal, round, and reactive to light.  Neck: Normal range of motion. Neck supple.  No meningismus.  Cardiovascular: Normal rate, regular rhythm, normal heart sounds and intact distal pulses.   No murmur heard. Pulmonary/Chest: Effort normal and breath sounds normal. No respiratory distress.  Abdominal: Soft. There is tenderness. There is no rebound and no guarding.  TTP LLQ, no guarding or rebound  Genitourinary:  No testicular pain  Musculoskeletal: Normal range of motion. He exhibits no edema and no tenderness.  5/5 strength in bilateral lower extremities. Ankle plantar and dorsiflexion intact. Great toe extension intact bilaterally. +2 DP and PT pulses. +2 patellar reflexes bilaterally. Normal gait.   Neurological: He is alert and oriented to person, place, and time. No cranial nerve deficit. He exhibits normal muscle tone. Coordination normal.  No ataxia on finger to nose bilaterally. No pronator drift. 5/5 strength throughout. CN 2-12 intact. Negative Romberg. Equal grip strength. Sensation intact. Gait is normal.   Skin: Skin is warm.  Psychiatric: He has a normal mood and affect. His behavior is normal.    ED Course  Procedures  (including critical care time) Labs Review Labs Reviewed  CBC WITH DIFFERENTIAL - Abnormal; Notable for the following:    WBC 13.0 (*)    Neutrophils Relative % 79 (*)    Neutro Abs 10.3 (*)    All other components within normal limits  COMPREHENSIVE METABOLIC PANEL - Abnormal; Notable for the following:    Sodium 136 (*)    Potassium 3.6 (*)    Glucose, Bld 132 (*)    GFR calc non Af Amer 69 (*)    GFR calc Af Amer 80 (*)    Anion gap 18 (*)    All other components within normal limits  LIPASE, BLOOD  URINALYSIS, ROUTINE W REFLEX MICROSCOPIC  D-DIMER, QUANTITATIVE  TROPONIN I  I-STAT CG4 LACTIC ACID, ED    Imaging Review Ct Abdomen Pelvis W Contrast  11/26/2013   CLINICAL DATA:  LLQ pain  EXAM: CT ABDOMEN AND PELVIS WITH CONTRAST  TECHNIQUE: Multidetector CT imaging of the abdomen and pelvis was performed using the standard protocol following bolus administration of intravenous contrast.  CONTRAST:  111mL OMNIPAQUE IOHEXOL 300 MG/ML  SOLN  COMPARISON:  None.  FINDINGS: Dependent atelectasis in the visualized lung  bases. Probable focal fatty infiltration in hepatic segment 4 adjacent to the falciform ligament. Liver otherwise unremarkable. Unremarkable spleen, nondilated gallbladder, pancreas, kidneys, aorta, right adrenal gland. Left adrenal gland has a somewhat nodular contour. Stomach, small bowel, colon are nondilated. Normal appendix. Urinary bladder physiologically distended. Moderate prostatic enlargement with central coarse calcifications. No ascites. No free air. No adenopathy localized. Portal vein patent. No hydronephrosis.  IMPRESSION: 1. No acute abnormality.   Electronically Signed   By: Arne Cleveland M.D.   On: 11/26/2013 11:07     EKG Interpretation   Date/Time:  Sunday November 26 2013 08:51:03 EDT Ventricular Rate:  68 PR Interval:  169 QRS Duration: 95 QT Interval:  418 QTC Calculation: 444 R Axis:   64 Text Interpretation:  Sinus rhythm Abnormal R-wave  progression, early  transition No significant change was found Confirmed by Wyvonnia Dusky  MD,  Jabrea Kallstrom ([id number]) on 11/26/2013 9:02:13 AM      MDM   Final diagnoses:  Left lower quadrant pain   three-day history of constant left-sided lower abdominal pain with one episode of vomiting. Dysuria  since prostate biopsy 10 days ago. Equal strength and pulses in lower extremities. No evidence of cauda equina or cord compression.  WBC 13, lactate 2. D-dimer negative. No chest pain or SOB. Labs show evidence of anion gap 18. No evidence of DKA. Urinalysis negative.  Patient also complaining of intermittent hiccups other associated with his abdominal pain. This improved with thorazine.   CT is negative for acute pathology. Patient tolerating by mouth. He is able to ambulate. Stable for follow up with PCP. Return precautions discussed.  Denies SI or HI.  BP 130/92  Pulse 78  Temp(Src) 97.6 F (36.4 C) (Oral)  Resp 15  Ht 5\' 8"  (1.727 m)  Wt 220 lb (99.791 kg)  BMI 33.46 kg/m2  SpO2 100%   [name], MD 11/26/13 1516

## 2013-11-30 ENCOUNTER — Encounter (HOSPITAL_COMMUNITY)
Admission: RE | Admit: 2013-11-30 | Discharge: 2013-11-30 | Disposition: A | Discharge status: Home / Self Care | Payer: Medicare Other | Source: Ambulatory Visit | Attending: Urology | Admitting: Urology

## 2013-11-30 DIAGNOSIS — C61 Malignant neoplasm of prostate: Secondary | ICD-10-CM | POA: Diagnosis present

## 2013-11-30 DIAGNOSIS — M549 Dorsalgia, unspecified: Secondary | ICD-10-CM | POA: Insufficient documentation

## 2013-11-30 MED ORDER — TECHNETIUM TC 99M MEDRONATE IV KIT
26.0000 | PACK | Freq: Once | INTRAVENOUS | Status: AC | PRN
  Administered 2013-11-30: 26 via INTRAVENOUS

## 2013-12-02 ENCOUNTER — Encounter (HOSPITAL_COMMUNITY): Payer: Self-pay | Admitting: Emergency Medicine

## 2013-12-02 DIAGNOSIS — Z8546 Personal history of malignant neoplasm of prostate: Secondary | ICD-10-CM | POA: Insufficient documentation

## 2013-12-02 DIAGNOSIS — E119 Type 2 diabetes mellitus without complications: Secondary | ICD-10-CM | POA: Insufficient documentation

## 2013-12-02 DIAGNOSIS — M5430 Sciatica, unspecified side: Secondary | ICD-10-CM | POA: Insufficient documentation

## 2013-12-02 DIAGNOSIS — IMO0002 Reserved for concepts with insufficient information to code with codable children: Secondary | ICD-10-CM | POA: Diagnosis not present

## 2013-12-02 DIAGNOSIS — I10 Essential (primary) hypertension: Secondary | ICD-10-CM | POA: Diagnosis not present

## 2013-12-02 DIAGNOSIS — G8929 Other chronic pain: Secondary | ICD-10-CM | POA: Insufficient documentation

## 2013-12-02 DIAGNOSIS — M479 Spondylosis, unspecified: Secondary | ICD-10-CM | POA: Diagnosis not present

## 2013-12-02 DIAGNOSIS — Z79899 Other long term (current) drug therapy: Secondary | ICD-10-CM | POA: Insufficient documentation

## 2013-12-02 DIAGNOSIS — Z7982 Long term (current) use of aspirin: Secondary | ICD-10-CM | POA: Insufficient documentation

## 2013-12-02 DIAGNOSIS — J438 Other emphysema: Secondary | ICD-10-CM | POA: Insufficient documentation

## 2013-12-02 DIAGNOSIS — F17200 Nicotine dependence, unspecified, uncomplicated: Secondary | ICD-10-CM | POA: Diagnosis not present

## 2013-12-02 LAB — CBG MONITORING, ED: GLUCOSE-CAPILLARY: 122 mg/dL — AB (ref 70–99)

## 2013-12-02 NOTE — ED Notes (Signed)
Patient here with complaint of hip pain which began about 3 days ago. Has chronic lower back pain which has been ongoing for about 6 months, states the back pain has increased as of 3 days ago. Recent diagnosis of prostate cancer. Previous lumbar spine surgery. States that he is able to walk but it is painful and difficult. Patient states that he has not been taking his medications at home. States that his support group is lacking and he feels depressed in light of the recent diagnosis.

## 2013-12-03 ENCOUNTER — Emergency Department (HOSPITAL_COMMUNITY)
Admission: EM | Admit: 2013-12-03 | Discharge: 2013-12-03 | Disposition: A | Discharge status: Home / Self Care | Payer: Medicare Other | Attending: Emergency Medicine | Admitting: Emergency Medicine

## 2013-12-03 DIAGNOSIS — M5432 Sciatica, left side: Secondary | ICD-10-CM

## 2013-12-03 DIAGNOSIS — M5430 Sciatica, unspecified side: Secondary | ICD-10-CM | POA: Diagnosis not present

## 2013-12-03 MED ORDER — CYCLOBENZAPRINE HCL 10 MG PO TABS
10.0000 mg | ORAL_TABLET | Freq: Once | ORAL | Status: AC
  Administered 2013-12-03: 10 mg via ORAL
  Filled 2013-12-03: qty 1

## 2013-12-03 MED ORDER — CYCLOBENZAPRINE HCL 10 MG PO TABS: 10.0000 mg | ORAL_TABLET | Freq: Three times a day (TID) | ORAL | Status: DC | PRN

## 2013-12-03 MED ORDER — OXYCODONE-ACETAMINOPHEN 5-325 MG PO TABS
2.0000 | ORAL_TABLET | Freq: Once | ORAL | Status: AC
  Administered 2013-12-03: 2 via ORAL
  Filled 2013-12-03: qty 2

## 2013-12-03 MED ORDER — OXYCODONE-ACETAMINOPHEN 7.5-325 MG PO TABS: 1.0000 | ORAL_TABLET | ORAL | Status: DC | PRN

## 2013-12-03 NOTE — Discharge Instructions (Signed)
Sciatica °Sciatica is pain, weakness, numbness, or tingling along the path of the sciatic nerve. The nerve starts in the lower back and runs down the back of each leg. The nerve controls the muscles in the lower leg and in the back of the knee, while also providing sensation to the back of the thigh, lower leg, and the sole of your foot. Sciatica is a symptom of another medical condition. For instance, nerve damage or certain conditions, such as a herniated disk or bone spur on the spine, pinch or put pressure on the sciatic nerve. This causes the pain, weakness, or other sensations normally associated with sciatica. Generally, sciatica only affects one side of the body. °CAUSES  °· Herniated or slipped disc. °· Degenerative disk disease. °· A pain disorder involving the narrow muscle in the buttocks (piriformis syndrome). °· Pelvic injury or fracture. °· Pregnancy. °· Tumor (rare). °SYMPTOMS  °Symptoms can vary from mild to very severe. The symptoms usually travel from the low back to the buttocks and down the back of the leg. Symptoms can include: °· Mild tingling or dull aches in the lower back, leg, or hip. °· Numbness in the back of the calf or sole of the foot. °· Burning sensations in the lower back, leg, or hip. °· Sharp pains in the lower back, leg, or hip. °· Leg weakness. °· Severe back pain inhibiting movement. °These symptoms may get worse with coughing, sneezing, laughing, or prolonged sitting or standing. Also, being overweight may worsen symptoms. °DIAGNOSIS  °Your caregiver will perform a physical exam to look for common symptoms of sciatica. He or she may ask you to do certain movements or activities that would trigger sciatic nerve pain. Other tests may be performed to find the cause of the sciatica. These may include: °· Blood tests. °· X-rays. °· Imaging tests, such as an MRI or CT scan. °TREATMENT  °Treatment is directed at the cause of the sciatic pain. Sometimes, treatment is not necessary  and the pain and discomfort goes away on its own. If treatment is needed, your caregiver may suggest: °· Over-the-counter medicines to relieve pain. °· Prescription medicines, such as anti-inflammatory medicine, muscle relaxants, or narcotics. °· Applying heat or ice to the painful area. °· Steroid injections to lessen pain, irritation, and inflammation around the nerve. °· Reducing activity during periods of pain. °· Exercising and stretching to strengthen your abdomen and improve flexibility of your spine. Your caregiver may suggest losing weight if the extra weight makes the back pain worse. °· Physical therapy. °· Surgery to eliminate what is pressing or pinching the nerve, such as a bone spur or part of a herniated disk. °HOME CARE INSTRUCTIONS  °· Only take over-the-counter or prescription medicines for pain or discomfort as directed by your caregiver. °· Apply ice to the affected area for 20 minutes, 3-4 times a day for the first 48-72 hours. Then try heat in the same way. °· Exercise, stretch, or perform your usual activities if these do not aggravate your pain. °· Attend physical therapy sessions as directed by your caregiver. °· Keep all follow-up appointments as directed by your caregiver. °· Do not wear high heels or shoes that do not provide proper support. °· Check your mattress to see if it is too soft. A firm mattress may lessen your pain and discomfort. °SEEK IMMEDIATE MEDICAL CARE IF:  °· You lose control of your bowel or bladder (incontinence). °· You have increasing weakness in the lower back, pelvis, buttocks,   or legs.  You have redness or swelling of your back.  You have a burning sensation when you urinate.  You have pain that gets worse when you lie down or awakens you at night.  Your pain is worse than you have experienced in the past.  Your pain is lasting longer than 4 weeks.  You are suddenly losing weight without reason. MAKE SURE YOU:  Understand these  instructions.  Will watch your condition.  Will get help right away if you are not doing well or get worse. Document Released: 04/14/2001 Document Revised: 10/20/2011 Document Reviewed: 08/30/2011 [facility] Patient Information 2015 [place], [place]. This information is not intended to replace advice given to you by your health care provider. Make sure you discuss any questions you have with your health care provider.  Cyclobenzaprine tablets What is this medicine? CYCLOBENZAPRINE (sye kloe BEN za preen) is a muscle relaxer. It is used to treat muscle pain, spasms, and stiffness. This medicine may be used for other purposes; ask your health care provider or pharmacist if you have questions. COMMON BRAND NAME(S): Fexmid, Flexeril What should I tell my health care provider before I take this medicine? They need to know if you have any of these conditions: -heart disease, irregular heartbeat, or previous heart attack -liver disease -thyroid problem -an unusual or allergic reaction to cyclobenzaprine, tricyclic antidepressants, lactose, other medicines, foods, dyes, or preservatives -pregnant or trying to get pregnant -breast-feeding How should I use this medicine? Take this medicine by mouth with a glass of water. Follow the directions on the prescription label. If this medicine upsets your stomach, take it with food or milk. Take your medicine at regular intervals. Do not take it more often than directed. Talk to your pediatrician regarding the use of this medicine in children. Special care may be needed. Overdosage: If you think you have taken too much of this medicine contact a poison control center or emergency room at once. NOTE: This medicine is only for you. Do not share this medicine with others. What if I miss a dose? If you miss a dose, take it as soon as you can. If it is almost time for your next dose, take only that dose. Do not take double or extra doses. What may interact with  this medicine? Do not take this medicine with any of the following medications: -certain medicines for fungal infections like fluconazole, itraconazole, ketoconazole, posaconazole, voriconazole -cisapride -dofetilide -dronedarone -droperidol -flecainide -grepafloxacin -halofantrine -levomethadyl -MAOIs like Carbex, Eldepryl, Marplan, Nardil, and Parnate -nilotinib -pimozide -probucol -sertindole -thioridazine -ziprasidone This medicine may also interact with the following medications: -abarelix -alcohol -certain medicines for cancer -certain medicines for depression, anxiety, or psychotic disturbances -certain medicines for infection like alfuzosin, chloroquine, clarithromycin, levofloxacin, mefloquine, pentamidine, troleandomycin -certain medicines for an irregular heart beat -certain medicines used for sleep or numbness during surgery or procedure -contrast dyes -dolasetron -guanethidine -methadone -octreotide -ondansetron -other medicines that prolong the QT interval (cause an abnormal heart rhythm) -palonosetron -phenothiazines like chlorpromazine, mesoridazine, prochlorperazine, thioridazine -tramadol -vardenafil This list may not describe all possible interactions. Give your health care provider a list of all the medicines, herbs, non-prescription drugs, or dietary supplements you use. Also tell them if you smoke, drink alcohol, or use illegal drugs. Some items may interact with your medicine. What should I watch for while using this medicine? Check with your doctor or health care professional if your condition does not improve within 1 to 3 weeks. You may get drowsy or dizzy when  you first start taking the medicine or change doses. Do not drive, use machinery, or do anything that may be dangerous until you know how the medicine affects you. Stand or sit up slowly. Your mouth may get dry. Drinking water, chewing sugarless gum, or sucking on hard candy may help. What  side effects may I notice from receiving this medicine? Side effects that you should report to your doctor or health care professional as soon as possible: -allergic reactions like skin rash, itching or hives, swelling of the face, lips, or tongue -chest pain -fast heartbeat -hallucinations -seizures -vomiting Side effects that usually do not require medical attention (report to your doctor or health care professional if they continue or are bothersome): -headache This list may not describe all possible side effects. Call your doctor for medical advice about side effects. You may report side effects to FDA at 1-800-FDA-1088. Where should I keep my medicine? Keep out of the reach of children. Store at room temperature between 15 and 30 degrees C (59 and 86 degrees F). Keep container tightly closed. Throw away any unused medicine after the expiration date. NOTE: This sheet is a summary. It may not cover all possible information. If you have questions about this medicine, talk to your doctor, pharmacist, or health care provider.  2015, [employer] Standard. (2012-11-15 12:48:19)  Acetaminophen; Oxycodone tablets What is this medicine? ACETAMINOPHEN; OXYCODONE (a set a MEE noe fen; ox i KOE done) is a pain reliever. It is used to treat mild to moderate pain. This medicine may be used for other purposes; ask your health care provider or pharmacist if you have questions. COMMON BRAND NAME(S): Endocet, Magnacet, Narvox, Percocet, Perloxx, Primalev, Primlev, Roxicet, Xolox What should I tell my health care provider before I take this medicine? They need to know if you have any of these conditions: -brain tumor -Crohn's disease, inflammatory bowel disease, or ulcerative colitis -drug abuse or addiction -head injury -heart or circulation problems -if you often drink alcohol -kidney disease or problems going to the bathroom -liver disease -lung disease, asthma, or breathing problems -an  unusual or allergic reaction to acetaminophen, oxycodone, other opioid analgesics, other medicines, foods, dyes, or preservatives -pregnant or trying to get pregnant -breast-feeding How should I use this medicine? Take this medicine by mouth with a full glass of water. Follow the directions on the prescription label. Take your medicine at regular intervals. Do not take your medicine more often than directed. Talk to your pediatrician regarding the use of this medicine in children. Special care may be needed. Patients over 15 years old may have a stronger reaction and need a smaller dose. Overdosage: If you think you have taken too much of this medicine contact a poison control center or emergency room at once. NOTE: This medicine is only for you. Do not share this medicine with others. What if I miss a dose? If you miss a dose, take it as soon as you can. If it is almost time for your next dose, take only that dose. Do not take double or extra doses. What may interact with this medicine? -alcohol -antihistamines -barbiturates like amobarbital, butalbital, butabarbital, methohexital, pentobarbital, phenobarbital, thiopental, and secobarbital -benztropine -drugs for bladder problems like solifenacin, trospium, oxybutynin, tolterodine, hyoscyamine, and methscopolamine -drugs for breathing problems like ipratropium and tiotropium -drugs for certain stomach or intestine problems like propantheline, homatropine methylbromide, glycopyrrolate, atropine, belladonna, and dicyclomine -general anesthetics like etomidate, ketamine, nitrous oxide, propofol, desflurane, enflurane, halothane, isoflurane, and sevoflurane -medicines for depression,  anxiety, or psychotic disturbances -medicines for sleep -muscle relaxants -naltrexone -narcotic medicines (opiates) for pain -phenothiazines like perphenazine, thioridazine, chlorpromazine, mesoridazine, fluphenazine, prochlorperazine, promazine, and  trifluoperazine -scopolamine -tramadol -trihexyphenidyl This list may not describe all possible interactions. Give your health care provider a list of all the medicines, herbs, non-prescription drugs, or dietary supplements you use. Also tell them if you smoke, drink alcohol, or use illegal drugs. Some items may interact with your medicine. What should I watch for while using this medicine? Tell your doctor or health care professional if your pain does not go away, if it gets worse, or if you have new or a different type of pain. You may develop tolerance to the medicine. Tolerance means that you will need a higher dose of the medication for pain relief. Tolerance is normal and is expected if you take this medicine for a long time. Do not suddenly stop taking your medicine because you may develop a severe reaction. Your body becomes used to the medicine. This does NOT mean you are addicted. Addiction is a behavior related to getting and using a drug for a non-medical reason. If you have pain, you have a medical reason to take pain medicine. Your doctor will tell you how much medicine to take. If your doctor wants you to stop the medicine, the dose will be slowly lowered over time to avoid any side effects. You may get drowsy or dizzy. Do not drive, use machinery, or do anything that needs mental alertness until you know how this medicine affects you. Do not stand or sit up quickly, especially if you are an older patient. This reduces the risk of dizzy or fainting spells. Alcohol may interfere with the effect of this medicine. Avoid alcoholic drinks. There are different types of narcotic medicines (opiates) for pain. If you take more than one type at the same time, you may have more side effects. Give your health care provider a list of all medicines you use. Your doctor will tell you how much medicine to take. Do not take more medicine than directed. Call emergency for help if you have problems  breathing. The medicine will cause constipation. Try to have a bowel movement at least every 2 to 3 days. If you do not have a bowel movement for 3 days, call your doctor or health care professional. Do not take Tylenol (acetaminophen) or medicines that have acetaminophen with this medicine. Too much acetaminophen can be very dangerous. Many nonprescription medicines contain acetaminophen. Always read the labels carefully to avoid taking more acetaminophen. What side effects may I notice from receiving this medicine? Side effects that you should report to your doctor or health care professional as soon as possible: -allergic reactions like skin rash, itching or hives, swelling of the face, lips, or tongue -breathing difficulties, wheezing -confusion -light headedness or fainting spells -severe stomach pain -unusually weak or tired -yellowing of the skin or the whites of the eyes Side effects that usually do not require medical attention (report to your doctor or health care professional if they continue or are bothersome): -dizziness -drowsiness -nausea -vomiting This list may not describe all possible side effects. Call your doctor for medical advice about side effects. You may report side effects to FDA at 1-800-FDA-1088. Where should I keep my medicine? Keep out of the reach of children. This medicine can be abused. Keep your medicine in a safe place to protect it from theft. Do not share this medicine with anyone. Selling or giving away  this medicine is dangerous and against the law. Store at room temperature between 20 and 25 degrees C (68 and 77 degrees F). Keep container tightly closed. Protect from light. This medicine may cause accidental overdose and death if it is taken by other adults, children, or pets. Flush any unused medicine down the toilet to reduce the chance of harm. Do not use the medicine after the expiration date. NOTE: This sheet is a summary. It may not cover all  possible information. If you have questions about this medicine, talk to your doctor, pharmacist, or health care provider.  2015, [employer]vier/Gold Standard. (2012-12-12 13:17:35)

## 2013-12-03 NOTE — ED Provider Notes (Signed)
CSN: [id number]     Arrival date & time 12/02/13  2107 History   First MD Initiated Contact with Patient 12/03/13 0104     Chief Complaint  Patient presents with  . Back Pain  . Hip Pain     (Consider location/radiation/quality/duration/timing/severity/associated sxs/prior Treatment) Patient is a 63 y.o. male presenting with back pain and hip pain. The history is provided by the patient.  Back Pain Hip Pain  He complains of pain in his lower back and left hip for the last 3 days. He denies any,. Pain is severe and he rates it at 10/10. It is worse with movement but nothing makes it better. He had a prescription for oxycodone-acetaminophen and he did take that we should give temporary relief. He denies any weakness, numbness, tingling. He denies any bowel or bladder dysfunction. He denies any recent trauma or overuse. He has had back surgery in the past. Also, of note, he was recently diagnosed with prostate cancer which he states is low grade. He had a bone scan this past week.  Past Medical History  Diagnosis Date  . Hypertension   . COPD (chronic obstructive pulmonary disease)   . Emphysema   . Enlarged prostate   . Degenerative arthritis of spine   . Back pain, chronic   . Diverticulitis   . Diabetes mellitus without complication   . Cancer   . Prostate cancer 11/2013   Past Surgical History  Procedure Laterality Date  . Back surgery      L4-L5  . Hernia repair     No family history on file. History  Substance Use Topics  . Smoking status: Current Some Day Smoker  . Smokeless tobacco: Not on file  . Alcohol Use: No    Review of Systems  Musculoskeletal: Positive for back pain.  All other systems reviewed and are negative.     Allergies  Vicodin  Home Medications   Prior to Admission medications   Medication Sig Start Date End Date Taking? Authorizing Provider  albuterol (PROVENTIL HFA;VENTOLIN HFA) 108 (90 BASE) MCG/ACT inhaler Inhale 2 puffs into the lungs  every 6 (six) hours as needed for wheezing or shortness of breath.    Yes Historical Provider, MD  aspirin EC 81 MG tablet Take 81 mg by mouth daily.   Yes Historical Provider, MD  atorvastatin (LIPITOR) 20 MG tablet Take 20 mg by mouth daily.   Yes Historical Provider, MD  escitalopram (LEXAPRO) 10 MG tablet Take 10 mg by mouth daily.   Yes Historical Provider, MD  Fluticasone-Salmeterol (ADVAIR) 250-50 MCG/DOSE AEPB Inhale 1 puff into the lungs every 12 (twelve) hours.   Yes Historical Provider, MD  lisinopril-hydrochlorothiazide (PRINZIDE,ZESTORETIC) 20-12.5 MG per tablet Take 1 tablet by mouth daily.   Yes Historical Provider, MD  metFORMIN (GLUCOPHAGE) 500 MG tablet Take 500 mg by mouth daily with breakfast.   Yes Historical Provider, MD  ondansetron (ZOFRAN) 4 MG tablet Take 1 tablet (4 mg total) by mouth every 6 (six) hours. 11/26/13  Yes [name], MD  oxyCODONE-acetaminophen (PERCOCET/ROXICET) 5-325 MG per tablet Take 2 tablets by mouth every 4 (four) hours as needed for severe pain. 11/26/13  Yes [name], MD  PRESCRIPTION MEDICATION Take 1 tablet by mouth 2 (two) times daily. Steroid medication from [facility]   Yes Historical Provider, MD  tamsulosin (FLOMAX) 0.4 MG CAPS capsule Take 0.4 mg by mouth daily.   Yes Historical Provider, MD   BP 127/89  Pulse 76  Temp(Src) 98 F (36.7 C) (Oral)  Resp 20  Ht 5\' 8"  (1.727 m)  Wt 220 lb (99.791 kg)  BMI 33.46 kg/m2  SpO2 100% Physical Exam  Nursing note and vitals reviewed.  63 year old male, resting comfortably and in no acute distress. Vital signs are normal. Oxygen saturation is 100%, which is normal. Head is normocephalic and atraumatic. PERRLA, EOMI. Oropharynx is clear. Neck is nontender and supple without adenopathy or JVD. Back is mildly tender in the lower lumbar area. Straight leg raise is positive bilaterally at 30. Lungs are clear without rales, wheezes, or rhonchi. Chest is nontender. Heart has  regular rate and rhythm without murmur. Abdomen is soft, flat, nontender without masses or hepatosplenomegaly and peristalsis is normoactive. Extremities have no cyanosis or edema, full range of motion is present. Skin is warm and dry without rash. Neurologic: Mental status is normal, cranial nerves are intact, there are no motor or sensory deficits.  ED Course  Procedures (including critical care time) Labs Review Results for orders placed during the hospital encounter of 12/03/13  CBG MONITORING, ED      Result Value Ref Range   Glucose-Capillary 122 (*) 70 - 99 mg/dL   Nm Bone Scan Whole Body  11/30/2013   CLINICAL DATA:  Newly diagnosed high-grade prostate cancer. Back pain.  EXAM: NUCLEAR MEDICINE WHOLE BODY BONE SCAN  TECHNIQUE: Whole body anterior and posterior images were obtained approximately 3 hours after intravenous injection of radiopharmaceutical.  RADIOPHARMACEUTICALS:  26.0 mCi Technetium-99 MDP  COMPARISON:  No priors.  FINDINGS: No focal areas of abnormal hypermetabolism identified within the axial or appendicular skeleton to suggest metastatic disease.  IMPRESSION: 1. No findings to suggest metastatic disease to the skeleton.   Electronically Signed   By: Vinnie Langton M.D.   On: 11/30/2013 14:49   Ct Abdomen Pelvis W Contrast  11/26/2013   CLINICAL DATA:  LLQ pain  EXAM: CT ABDOMEN AND PELVIS WITH CONTRAST  TECHNIQUE: Multidetector CT imaging of the abdomen and pelvis was performed using the standard protocol following bolus administration of intravenous contrast.  CONTRAST:  117mL OMNIPAQUE IOHEXOL 300 MG/ML  SOLN  COMPARISON:  None.  FINDINGS: Dependent atelectasis in the visualized lung bases. Probable focal fatty infiltration in hepatic segment 4 adjacent to the falciform ligament. Liver otherwise unremarkable. Unremarkable spleen, nondilated gallbladder, pancreas, kidneys, aorta, right adrenal gland. Left adrenal gland has a somewhat nodular contour. Stomach, small  bowel, colon are nondilated. Normal appendix. Urinary bladder physiologically distended. Moderate prostatic enlargement with central coarse calcifications. No ascites. No free air. No adenopathy localized. Portal vein patent. No hydronephrosis.  IMPRESSION: 1. No acute abnormality.   Electronically Signed   By: Arne Cleveland M.D.   On: 11/26/2013 11:07      MDM   Final diagnoses:  Sciatic neuralgia, left    Left-sided sciatica. No evidence of neurologic compromise. Old records are reviewed and bone scan showed no evidence of metastatic disease. He did have an MRI scan several years ago which showed evidence of spinal stenosis and postsurgical changes. He states that he cannot take NSAIDs because of his prostate cancer. He has run out of his oxycodone and acetaminophen. He will be discharged with prescription of Roxicodone-acetaminophen and also for cyclobenzaprine and he is to followup with his PCP. He may need referral for physical therapy may need to be referred back to his neurosurgeon.    Delora Fuel, MD 44/03/47 [id number]

## 2013-12-13 ENCOUNTER — Emergency Department (HOSPITAL_COMMUNITY)
Admission: EM | Admit: 2013-12-13 | Discharge: 2013-12-13 | Disposition: A | Discharge status: Home / Self Care | Payer: Medicare Other | Attending: Emergency Medicine | Admitting: Emergency Medicine

## 2013-12-13 ENCOUNTER — Encounter (HOSPITAL_COMMUNITY): Payer: Self-pay | Admitting: Emergency Medicine

## 2013-12-13 DIAGNOSIS — G8929 Other chronic pain: Secondary | ICD-10-CM | POA: Diagnosis not present

## 2013-12-13 DIAGNOSIS — M79609 Pain in unspecified limb: Secondary | ICD-10-CM | POA: Diagnosis not present

## 2013-12-13 DIAGNOSIS — F17200 Nicotine dependence, unspecified, uncomplicated: Secondary | ICD-10-CM | POA: Insufficient documentation

## 2013-12-13 DIAGNOSIS — M479 Spondylosis, unspecified: Secondary | ICD-10-CM | POA: Diagnosis not present

## 2013-12-13 DIAGNOSIS — Z7982 Long term (current) use of aspirin: Secondary | ICD-10-CM | POA: Insufficient documentation

## 2013-12-13 DIAGNOSIS — G589 Mononeuropathy, unspecified: Secondary | ICD-10-CM | POA: Diagnosis not present

## 2013-12-13 DIAGNOSIS — M542 Cervicalgia: Secondary | ICD-10-CM | POA: Insufficient documentation

## 2013-12-13 DIAGNOSIS — E119 Type 2 diabetes mellitus without complications: Secondary | ICD-10-CM | POA: Diagnosis not present

## 2013-12-13 DIAGNOSIS — Z8546 Personal history of malignant neoplasm of prostate: Secondary | ICD-10-CM | POA: Diagnosis not present

## 2013-12-13 DIAGNOSIS — J4489 Other specified chronic obstructive pulmonary disease: Secondary | ICD-10-CM | POA: Insufficient documentation

## 2013-12-13 DIAGNOSIS — IMO0002 Reserved for concepts with insufficient information to code with codable children: Secondary | ICD-10-CM | POA: Insufficient documentation

## 2013-12-13 DIAGNOSIS — J449 Chronic obstructive pulmonary disease, unspecified: Secondary | ICD-10-CM | POA: Diagnosis not present

## 2013-12-13 DIAGNOSIS — N400 Enlarged prostate without lower urinary tract symptoms: Secondary | ICD-10-CM | POA: Insufficient documentation

## 2013-12-13 DIAGNOSIS — I10 Essential (primary) hypertension: Secondary | ICD-10-CM | POA: Insufficient documentation

## 2013-12-13 DIAGNOSIS — Z79899 Other long term (current) drug therapy: Secondary | ICD-10-CM | POA: Insufficient documentation

## 2013-12-13 DIAGNOSIS — G629 Polyneuropathy, unspecified: Secondary | ICD-10-CM

## 2013-12-13 MED ORDER — OXYCODONE-ACETAMINOPHEN 5-325 MG PO TABS
2.0000 | ORAL_TABLET | Freq: Once | ORAL | Status: AC
  Administered 2013-12-13: 2 via ORAL
  Filled 2013-12-13: qty 2

## 2013-12-13 MED ORDER — OXYCODONE-ACETAMINOPHEN 5-325 MG PO TABS: 2.0000 | ORAL_TABLET | ORAL | Status: DC | PRN

## 2013-12-13 MED ORDER — ALBUTEROL SULFATE HFA 108 (90 BASE) MCG/ACT IN AERS: 1.0000 | INHALATION_SPRAY | Freq: Four times a day (QID) | RESPIRATORY_TRACT | Status: DC | PRN

## 2013-12-13 NOTE — Discharge Instructions (Signed)
Take Percocet as needed for pain. Use inhaler as needed. Follow up with your doctor for further evaluation.

## 2013-12-13 NOTE — ED Provider Notes (Signed)
CSN: [id number]     Arrival date & time 12/13/13  1807 History   First MD Initiated Contact with Patient 12/13/13 1837   This chart was scribed for non-physician practitioner Phineas Inches, PA-C,  working with Dot Lanes, MD by Rosary Lively, ED scribe. This patient was seen in room TR08C/TR08C and the patient's care was started at 6:46 PM.    Chief Complaint  Patient presents with  . Pain    The history is provided by the patient. No language interpreter was used.   HPI Comments:  Ryan Hopkins is a 63 y.o. male who presents to the Emergency Department complaining of neck pain, leg pain, along with pain in the feet. Pt states that he has a h/o neuropathy. Pt states that he is scheduled to visit PCP in 6 days. Pt also states that he is experiencing a hiccup-like cough, that causes him to lose his breath. Pt states that he going is through a lot this Summer and has been recently diagnosed with diabetes and prostate cancer. Pt denies fever.  Past Medical History  Diagnosis Date  . Hypertension   . COPD (chronic obstructive pulmonary disease)   . Emphysema   . Enlarged prostate   . Degenerative arthritis of spine   . Back pain, chronic   . Diverticulitis   . Diabetes mellitus without complication   . Cancer   . Prostate cancer 11/2013   Past Surgical History  Procedure Laterality Date  . Back surgery      L4-L5  . Hernia repair     History reviewed. No pertinent family history. History  Substance Use Topics  . Smoking status: Current Some Day Smoker  . Smokeless tobacco: Not on file  . Alcohol Use: No    Review of Systems  Constitutional: Negative for fever, chills and fatigue.  HENT: Negative for trouble swallowing.   Eyes: Negative for visual disturbance.  Respiratory: Negative for shortness of breath.   Cardiovascular: Negative for chest pain and palpitations.  Gastrointestinal: Negative for nausea, vomiting, abdominal pain and diarrhea.  Genitourinary: Negative  for dysuria and difficulty urinating.  Musculoskeletal: Positive for arthralgias and myalgias. Negative for neck pain.  Skin: Negative for color change.  Neurological: Negative for dizziness and weakness.  Psychiatric/Behavioral: Negative for dysphoric mood.  All other systems reviewed and are negative.     Allergies  Vicodin  Home Medications   Prior to Admission medications   Medication Sig Start Date End Date Taking? Authorizing Provider  albuterol (PROVENTIL HFA;VENTOLIN HFA) 108 (90 BASE) MCG/ACT inhaler Inhale 2 puffs into the lungs every 6 (six) hours as needed for wheezing or shortness of breath.     Historical Provider, MD  albuterol (PROVENTIL HFA;VENTOLIN HFA) 108 (90 BASE) MCG/ACT inhaler Inhale 1-2 puffs into the lungs every 6 (six) hours as needed for wheezing or shortness of breath. 12/13/13   Kinta Martis, PA-C  aspirin EC 81 MG tablet Take 81 mg by mouth daily.    Historical Provider, MD  atorvastatin (LIPITOR) 20 MG tablet Take 20 mg by mouth daily.    Historical Provider, MD  cyclobenzaprine (FLEXERIL) 10 MG tablet Take 1 tablet (10 mg total) by mouth 3 (three) times daily as needed for muscle spasms. 0/6/23   Delora Fuel, MD  escitalopram (LEXAPRO) 10 MG tablet Take 10 mg by mouth daily.    Historical Provider, MD  Fluticasone-Salmeterol (ADVAIR) 250-50 MCG/DOSE AEPB Inhale 1 puff into the lungs every 12 (twelve) hours.    Historical  Provider, MD  lisinopril-hydrochlorothiazide (PRINZIDE,ZESTORETIC) 20-12.5 MG per tablet Take 1 tablet by mouth daily.    Historical Provider, MD  metFORMIN (GLUCOPHAGE) 500 MG tablet Take 500 mg by mouth daily with breakfast.    Historical Provider, MD  ondansetron (ZOFRAN) 4 MG tablet Take 1 tablet (4 mg total) by mouth every 6 (six) hours. 11/26/13   [name], MD  oxyCODONE-acetaminophen (PERCOCET) 7.5-325 MG per tablet Take 1 tablet by mouth every 4 (four) hours as needed for pain. 12/03/48   Delora Fuel, MD   oxyCODONE-acetaminophen (PERCOCET/ROXICET) 5-325 MG per tablet Take 2 tablets by mouth every 4 (four) hours as needed for severe pain. 11/26/13   [name], MD  oxyCODONE-acetaminophen (PERCOCET/ROXICET) 5-325 MG per tablet Take 2 tablets by mouth every 4 (four) hours as needed for moderate pain or severe pain. 12/13/13   [name], PA-C  PRESCRIPTION MEDICATION Take 1 tablet by mouth 2 (two) times daily. Steroid medication from [facility]    Historical Provider, MD  tamsulosin (FLOMAX) 0.4 MG CAPS capsule Take 0.4 mg by mouth daily.    Historical Provider, MD   BP 119/87  Pulse 92  Temp(Src) 98.8 F (37.1 C) (Oral)  Resp 18  SpO2 97% Physical Exam  Nursing note and vitals reviewed. Constitutional: He is oriented to person, place, and time. He appears well-developed and well-nourished. No distress.  HENT:  Head: Normocephalic and atraumatic.  Eyes: Conjunctivae and EOM are normal.  Neck: Normal range of motion. Neck supple.  Cardiovascular: Normal rate and regular rhythm.  Exam reveals no gallop and no friction rub.   No murmur heard. Pulmonary/Chest: Effort normal and breath sounds normal. He has no wheezes. He has no rales. He exhibits no tenderness.  Abdominal: Soft. There is no tenderness.  Musculoskeletal: Normal range of motion.  Neurological: He is alert and oriented to person, place, and time.  Speech is goal-oriented. Moves limbs without ataxia.   Skin: Skin is warm and dry.  Psychiatric: He has a normal mood and affect. His behavior is normal.    ED Course  Procedures  DIAGNOSTIC STUDIES: Oxygen Saturation is 97% on RA, adequate by my interpretation.  COORDINATION OF CARE: 6:50 PM-Discussed treatment plan which includes pain medication and inhaler with pt at bedside and pt agreed to plan.  Labs Review Labs Reviewed - No data to display  Imaging Review No results found.   EKG Interpretation None      MDM   Final diagnoses:  Chronic  neck pain  Neuropathy    Patient will have albuterol inhaler for COPD. Patient has exacerbation of chronic pain and has a follow up with PCP in a few days. Patient will have Percocet for pain. Vitals stable and patient afebrile.   I personally performed the services described in this documentation, which was scribed in my presence. The recorded information has been reviewed and is accurate.      [name], PA-C 12/13/13 1948

## 2013-12-13 NOTE — ED Notes (Signed)
Pt reports pain to his neck, bilateral legs and feet, hx of neuropathy but it is now more severe and unable to sleep. Ambulatory at triage.

## 2013-12-18 NOTE — ED Provider Notes (Signed)
Medical screening examination/treatment/procedure(s) were performed by non-physician practitioner and as supervising physician I was immediately available for consultation/collaboration.    Dot Lanes, MD 12/18/13 [phone]

## 2013-12-27 ENCOUNTER — Other Ambulatory Visit: Payer: Self-pay | Admitting: Urology

## 2013-12-30 ENCOUNTER — Encounter (HOSPITAL_COMMUNITY): Payer: Self-pay | Admitting: Emergency Medicine

## 2013-12-30 ENCOUNTER — Emergency Department (HOSPITAL_COMMUNITY)
Admission: EM | Admit: 2013-12-30 | Discharge: 2013-12-30 | Disposition: A | Discharge status: Home / Self Care | Payer: Medicare Other | Attending: Emergency Medicine | Admitting: Emergency Medicine

## 2013-12-30 DIAGNOSIS — Z87828 Personal history of other (healed) physical injury and trauma: Secondary | ICD-10-CM | POA: Insufficient documentation

## 2013-12-30 DIAGNOSIS — Z21 Asymptomatic human immunodeficiency virus [HIV] infection status: Secondary | ICD-10-CM | POA: Insufficient documentation

## 2013-12-30 DIAGNOSIS — M545 Low back pain, unspecified: Secondary | ICD-10-CM | POA: Diagnosis not present

## 2013-12-30 DIAGNOSIS — G589 Mononeuropathy, unspecified: Secondary | ICD-10-CM | POA: Insufficient documentation

## 2013-12-30 DIAGNOSIS — M542 Cervicalgia: Secondary | ICD-10-CM | POA: Insufficient documentation

## 2013-12-30 DIAGNOSIS — M5442 Lumbago with sciatica, left side: Secondary | ICD-10-CM

## 2013-12-30 DIAGNOSIS — G629 Polyneuropathy, unspecified: Secondary | ICD-10-CM

## 2013-12-30 DIAGNOSIS — M5441 Lumbago with sciatica, right side: Secondary | ICD-10-CM

## 2013-12-30 MED ORDER — OXYCODONE-ACETAMINOPHEN 7.5-325 MG PO TABS: 1.0000 | ORAL_TABLET | ORAL | Status: DC | PRN

## 2013-12-30 NOTE — ED Notes (Signed)
Pt. Stated, Im out of Xanax and Percocet , she only gave me 10 and I missed my appt. And I go see her Spt. 28.  My legs and back are in pain.  Im also having prostate cancer surgery on  Oct. 16.

## 2013-12-30 NOTE — ED Notes (Signed)
Declined W/C at D/C and was escorted to lobby by RN. 

## 2013-12-30 NOTE — Discharge Instructions (Signed)
Do exercises twice daily followed by moist heat for 15 minutes. ° ° ° ° ° °Try to be as active as possible. ° °If no better in 2 weeks, follow up with orthopedist. ° ° °TREATMENT  °Treatment initially involves the use of ice and medication to help reduce pain and inflammation. It is also important to perform strengthening and stretching exercises and modify activities that worsen symptoms so the injury does not get worse. These exercises may be performed at home or with a therapist. For patients who experience severe symptoms, a soft padded collar may be recommended to be worn around the neck.  °Improving your posture may help reduce symptoms. Posture improvement includes pulling your chin and abdomen in while sitting or standing. If you are sitting, sit in a firm chair with your buttocks against the back of the chair. While sleeping, try replacing your pillow with a small towel rolled to 2 inches in diameter, or use a cervical pillow. Poor sleeping positions delay healing.  ° °MEDICATION  °· If pain medication is necessary, nonsteroidal anti-inflammatory medications, such as aspirin and ibuprofen, or other minor pain relievers, such as acetaminophen, are often recommended. °· Do not take pain medication for 7 days before surgery. °· Prescription pain relievers may be given if deemed necessary by your caregiver. Use only as directed and only as much as you need. ° °HEAT AND COLD:  °· Cold treatment (icing) relieves pain and reduces inflammation. Cold treatment should be applied for 10 to 15 minutes every 2 to 3 hours for inflammation and pain and immediately after any activity that aggravates your symptoms. Use ice packs or an ice massage. °· Heat treatment may be used prior to performing the stretching and strengthening activities prescribed by your caregiver, physical therapist, or athletic trainer. Use a heat pack or a warm soak. ° °SEEK MEDICAL CARE IF:  °· Symptoms get worse or do not improve in 2 weeks despite  treatment. °· New, unexplained symptoms develop (drugs used in treatment may produce side effects). ° °EXERCISES °RANGE OF MOTION (ROM) AND STRETCHING EXERCISES - Cervical Strain and Sprain °These exercises may help you when beginning to rehabilitate your injury. In order to successfully resolve your symptoms, you must improve your posture. These exercises are designed to help reduce the forward-head and rounded-shoulder posture which contributes to this condition. Your symptoms may resolve with or without further involvement from your physician, physical therapist or athletic trainer. While completing these exercises, remember:  °· Restoring tissue flexibility helps normal motion to return to the joints. This allows healthier, less painful movement and activity. °· An effective stretch should be held for at least 20 seconds, although you may need to begin with shorter hold times for comfort. °· A stretch should never be painful. You should only feel a gentle lengthening or release in the stretched tissue. ° °STRETCH- Axial Extensors °· Lie on your back on the floor. You may bend your knees for comfort. Place a rolled up hand towel or dish towel, about 2 inches in diameter, under the part of your head that makes contact with the floor. °· Gently tuck your chin, as if trying to make a "double chin," until you feel a gentle stretch at the base of your head. °· Hold _____10_____ seconds. °Repeat _____10_____ times. Complete this exercise _____2_____ times per day.  ° °STRETECH - Axial Extension  °· Stand or sit on a firm surface. Assume a good posture: chest up, shoulders drawn back, abdominal muscles slightly   tense, knees unlocked (if standing) and feet hip width apart. °· Slowly retract your chin so your head slides back and your chin slightly lowers.Continue to look straight ahead. °· You should feel a gentle stretch in the back of your head. Be certain not to feel an aggressive stretch since this can cause  headaches later. °· Hold for ____10______ seconds. °Repeat _____10_____ times. Complete this exercise ____2______ times per day. ° °STRETCH  Cervical Side Bend  °· Stand or sit on a firm surface. Assume a good posture: chest up, shoulders drawn back, abdominal muscles slightly tense, knees unlocked (if standing) and feet hip width apart. °· Without letting your nose or shoulders move, slowly tip your right / left ear to your shoulder until your feel a gentle stretch in the muscles on the opposite side of your neck. °· Hold _____10_____ seconds. °Repeat _____10_____ times. Complete this exercise _____2_____ times per day. ° °STRETCH  Cervical Rotators  °· Stand or sit on a firm surface. Assume a good posture: chest up, shoulders drawn back, abdominal muscles slightly tense, knees unlocked (if standing) and feet hip width apart. °· Keeping your eyes level with the ground, slowly turn your head until you feel a gentle stretch along the back and opposite side of your neck. °· Hold _____10_____ seconds. °Repeat ____10______ times. Complete this exercise ____2______ times per day. ° °RANGE OF MOTION - Neck Circles  °· Stand or sit on a firm surface. Assume a good posture: chest up, shoulders drawn back, abdominal muscles slightly tense, knees unlocked (if standing) and feet hip width apart. °· Gently roll your head down and around from the back of one shoulder to the back of the other. The motion should never be forced or painful. °· Repeat the motion 10-20 times, or until you feel the neck muscles relax and loosen. °Repeat ____10______ times. Complete the exercise _____2_____ times per day. ° °STRENGTHENING EXERCISES - Cervical Strain and Sprain °These exercises may help you when beginning to rehabilitate your injury. They may resolve your symptoms with or without further involvement from your physician, physical therapist or athletic trainer. While completing these exercises, remember:  °· Muscles can gain both the  endurance and the strength needed for everyday activities through controlled exercises. °· Complete these exercises as instructed by your physician, physical therapist or athletic trainer. Progress the resistance and repetitions only as guided. °· You may experience muscle soreness or fatigue, but the pain or discomfort you are trying to eliminate should never worsen during these exercises. If this pain does worsen, stop and make certain you are following the directions exactly. If the pain is still present after adjustments, discontinue the exercise until you can discuss the trouble with your clinician. ° °STRENGTH Cervical Flexors, Isometric °· Face a wall, standing about 6 inches away. Place a small pillow, a ball about 6-8 inches in diameter, or a folded towel between your forehead and the wall. °· Slightly tuck your chin and gently push your forehead into the soft object. Push only with mild to moderate intensity, building up tension gradually. Keep your jaw and forehead relaxed. °· Hold 10 to 20 seconds. Keep your breathing relaxed. °· Release the tension slowly. Relax your neck muscles completely before you start the next repetition. °Repeat _____10_____ times. Complete this exercise _____2_____ times per day. ° °STRENGTH- Cervical Lateral Flexors, Isometric  °· Stand about 6 inches away from a wall. Place a small pillow, a ball about 6-8 inches in diameter, or a folded   towel between the side of your head and the wall. °· Slightly tuck your chin and gently tilt your head into the soft object. Push only with mild to moderate intensity, building up tension gradually. Keep your jaw and forehead relaxed. °· Hold 10 to 20 seconds. Keep your breathing relaxed. °· Release the tension slowly. Relax your neck muscles completely before you start the next repetition. °Repeat _____10_____ times. Complete this exercise ____2______ times per day. ° °STRENGTH  Cervical Extensors, Isometric  °· Stand about 6 inches away from  a wall. Place a small pillow, a ball about 6-8 inches in diameter, or a folded towel between the back of your head and the wall. °· Slightly tuck your chin and gently tilt your head back into the soft object. Push only with mild to moderate intensity, building up tension gradually. Keep your jaw and forehead relaxed. °· Hold 10 to 20 seconds. Keep your breathing relaxed. °· Release the tension slowly. Relax your neck muscles completely before you start the next repetition. °Repeat _____10_____ times. Complete this exercise _____2_____ times per day. ° °POSTURE AND BODY MECHANICS CONSIDERATIONS - Cervical Strain and Sprain °Keeping correct posture when sitting, standing or completing your activities will reduce the stress put on different body tissues, allowing injured tissues a chance to heal and limiting painful experiences. The following are general guidelines for improved posture. Your physician or physical therapist will provide you with any instructions specific to your needs. While reading these guidelines, remember: °· The exercises prescribed by your provider will help you have the flexibility and strength to maintain correct postures. °· The correct posture provides the optimal environment for your joints to work. All of your joints have less wear and tear when properly supported by a spine with good posture. This means you will experience a healthier, less painful body. °· Correct posture must be practiced with all of your activities, especially prolonged sitting and standing. Correct posture is as important when doing repetitive low-stress activities (typing) as it is when doing a single heavy-load activity (lifting). °PROLONGED STANDING WHILE SLIGHTLY LEANING FORWARD °When completing a task that requires you to lean forward while standing in one place for a long time, place either foot up on a stationary 2-4 inch high object to help maintain the best posture. When both feet are on the ground, the low  back tends to lose its slight inward curve. If this curve flattens (or becomes too large), then the back and your other joints will experience too much stress, fatigue more quickly and can cause pain.  °RESTING POSITIONS °Consider which positions are most painful for you when choosing a resting position. If you have pain with flexion-based activities (sitting, bending, stooping, squatting), choose a position that allows you to rest in a less flexed posture. You would want to avoid curling into a fetal position on your side. If your pain worsens with extension-based activities (prolonged standing, working overhead), avoid resting in an extended position such as sleeping on your stomach. Most people will find more comfort when they rest with their spine in a more neutral position, neither too rounded nor too arched. Lying on a non-sagging bed on your side with a pillow between your knees, or on your back with a pillow under your knees will often provide some relief. Keep in mind, being in any one position for a prolonged period of time, no matter how correct your posture, can still lead to stiffness. °WALKING °Walk with an upright posture. Your ears,   shoulders and hips should all line-up. °OFFICE WORK °When working at a desk, create an environment that supports good, upright posture. Without extra support, muscles fatigue and lead to excessive strain on joints and other tissues. °CHAIR: °· A chair should be able to slide under your desk when your back makes contact with the back of the chair. This allows you to work closely. °· The chair's height should allow your eyes to be level with the upper part of your monitor and your hands to be slightly lower than your elbows. °· Body position: °· Your feet should make contact with the floor. If this is not possible, use a foot rest. °· Keep your ears over your shoulders. This will reduce stress on your neck and low back. °Document Released: 04/20/2005 Document Revised:  07/13/2011 Document Reviewed: 08/02/2008 °[employer]® Patient Information ©2013 [employer]. ° ° °

## 2013-12-30 NOTE — ED Provider Notes (Signed)
Chief Complaint   Chief Complaint  Patient presents with  . Back Pain  . Leg Pain    History of Present Illness   Ryan Hopkins is a 63 year old male with diabetes, hypertension, and prostate cancer who presents with a chronic history of lower back pain. This is worse when walking and is rated as a 10 over 10 in intensity. His leg feels weak and he has night sweats. He denies any fevers, chills, or weight loss. He's had bilateral leg pain for years which is due to neuropathy. He's been given Percocet and Xanax for this, but has ran out and is requesting refills on both of these. He also has chronic neck pain as well with stiffness and pain with movement. He denies any bladder or bowel dysfunction or saddle anesthesia. He states sometimes he does have some difficulty urinating and occasionally some incontinence, but attributes this to his prostate problems.  Review of Systems   Other than as noted above, the patient denies any of the following symptoms: Systemic:  No fever, chills, or unexplained weight loss. GI:  No abdominal painor incontinence of bowel. GU:  No dysuria, frequency, urgency, or hematuria. No incontinence of urine or urinary retention.  M-S:  No neck pain or arthritis. Neuro:  No paresthesias, headache, saddle anesthesia, muscular weakness, or progressive neurological deficit.  [place]   Past medical history, family history, social history, meds, and allergies were reviewed. Specifically, there is no history of major trauma, osteoporosis, immunosuppression, or HIV infection.   Physical Examination    Vital signs:  BP 109/70  Pulse 115  Temp(Src) 98.9 F (37.2 C) (Oral)  Resp 16  Ht 5\' 8"  (1.727 m)  Wt 210 lb (95.255 kg)  BMI 31.94 kg/m2  SpO2 97% General:  Alert, oriented, in no distress. Neck: Trapezius ridges are tender to palpation. The neck has a limited range of motion with pain. Abdomen:  Soft, non-tender.  No organomegaly or mass.  No pulsatile midline  abdominal mass or bruit. Back:  The back is tender to palpation and has a limited range of motion with only about 20 of flexion, 10 of extension, 10 of lateral bending, 20 of rotation with pain. Straight leg raising produces lower back, buttock, and leg pain bilaterally. Neuro:  Normal muscle strength, sensations and DTRs. Extremities: Pedal pulses were full, there was no edema. Skin:  Clear, warm and dry.  No rash.  Assessment   The primary encounter diagnosis was Bilateral low back pain with sciatica, sciatica laterality unspecified. Diagnoses of Neck pain and Neuropathy were also pertinent to this visit.  No evidence of cauda equina syndrome, discitis, epidural abscess, or aneurism.  His prostate cancer could be contributing to his lower back pain. I offered an x-ray, but he declines. He was angry that did not give him a prescription for Xanax. I explained to him that we did not prescribe Xanax through the emergency room, and he would need to look to his primary care physician for this. He was not satisfied with this answer, and felt that I should have given him what he asked for. I did give him a small prescription for his Percocet, enough to last until he can get in to see his primary care physician. He wanted a larger prescription, but I explained to him that we only gave a limited amount of this medication.  Plan     1.  Meds:  The following meds were prescribed:   Discharge Medication List as of 12/30/2013  2:48 PM    START taking these medications   Details  !! oxyCODONE-acetaminophen (PERCOCET) 7.5-325 MG per tablet Take 1 tablet by mouth every 4 (four) hours as needed for pain., Starting 12/30/2013, Until Discontinued, Print     !! - Potential duplicate medications found. Please discuss with provider.      2.  Patient Education/Counseling:  The patient was given appropriate handouts, self care instructions, and instructed in symptomatic relief. The patient was encouraged to try  to be as active as possible and given some exercises to do followed by moist heat.   3.  Follow up:  The patient was told to follow up here if no better in 3 to 4 days, or sooner if becoming worse in any way, and given some red flag symptoms such as worsening pain or new neurological symptoms which would prompt immediate return.  Follow up with his primary care physician as soon as possible.     Harden Mo, MD 12/30/13 [id number]

## 2013-12-31 MED ORDER — OXYCODONE-ACETAMINOPHEN 7.5-325 MG PO TABS: 1.0000 | ORAL_TABLET | ORAL | Status: DC | PRN

## 2014-01-08 ENCOUNTER — Emergency Department (HOSPITAL_COMMUNITY)
Admission: EM | Admit: 2014-01-08 | Discharge: 2014-01-08 | Disposition: A | Discharge status: Home / Self Care | Payer: Medicare Other | Attending: Emergency Medicine | Admitting: Emergency Medicine

## 2014-01-08 ENCOUNTER — Encounter (HOSPITAL_COMMUNITY): Payer: Self-pay | Admitting: Emergency Medicine

## 2014-01-08 DIAGNOSIS — I10 Essential (primary) hypertension: Secondary | ICD-10-CM | POA: Insufficient documentation

## 2014-01-08 DIAGNOSIS — F17200 Nicotine dependence, unspecified, uncomplicated: Secondary | ICD-10-CM | POA: Insufficient documentation

## 2014-01-08 DIAGNOSIS — Z79899 Other long term (current) drug therapy: Secondary | ICD-10-CM | POA: Insufficient documentation

## 2014-01-08 DIAGNOSIS — M5430 Sciatica, unspecified side: Secondary | ICD-10-CM | POA: Insufficient documentation

## 2014-01-08 DIAGNOSIS — M549 Dorsalgia, unspecified: Secondary | ICD-10-CM | POA: Insufficient documentation

## 2014-01-08 DIAGNOSIS — M5441 Lumbago with sciatica, right side: Secondary | ICD-10-CM

## 2014-01-08 DIAGNOSIS — Z8719 Personal history of other diseases of the digestive system: Secondary | ICD-10-CM | POA: Insufficient documentation

## 2014-01-08 DIAGNOSIS — G8929 Other chronic pain: Secondary | ICD-10-CM | POA: Diagnosis not present

## 2014-01-08 DIAGNOSIS — M79609 Pain in unspecified limb: Secondary | ICD-10-CM | POA: Insufficient documentation

## 2014-01-08 DIAGNOSIS — Z7982 Long term (current) use of aspirin: Secondary | ICD-10-CM | POA: Insufficient documentation

## 2014-01-08 DIAGNOSIS — E119 Type 2 diabetes mellitus without complications: Secondary | ICD-10-CM | POA: Diagnosis not present

## 2014-01-08 DIAGNOSIS — M479 Spondylosis, unspecified: Secondary | ICD-10-CM | POA: Diagnosis not present

## 2014-01-08 DIAGNOSIS — Z9889 Other specified postprocedural states: Secondary | ICD-10-CM | POA: Insufficient documentation

## 2014-01-08 DIAGNOSIS — N400 Enlarged prostate without lower urinary tract symptoms: Secondary | ICD-10-CM | POA: Insufficient documentation

## 2014-01-08 DIAGNOSIS — Z8546 Personal history of malignant neoplasm of prostate: Secondary | ICD-10-CM | POA: Diagnosis not present

## 2014-01-08 DIAGNOSIS — IMO0002 Reserved for concepts with insufficient information to code with codable children: Secondary | ICD-10-CM | POA: Diagnosis not present

## 2014-01-08 DIAGNOSIS — J449 Chronic obstructive pulmonary disease, unspecified: Secondary | ICD-10-CM | POA: Diagnosis not present

## 2014-01-08 DIAGNOSIS — J4489 Other specified chronic obstructive pulmonary disease: Secondary | ICD-10-CM | POA: Insufficient documentation

## 2014-01-08 MED ORDER — OXYCODONE-ACETAMINOPHEN 5-325 MG PO TABS
1.0000 | ORAL_TABLET | Freq: Once | ORAL | Status: AC
  Administered 2014-01-08: 1 via ORAL
  Filled 2014-01-08: qty 1

## 2014-01-08 MED ORDER — OXYCODONE-ACETAMINOPHEN 5-325 MG PO TABS: 2.0000 | ORAL_TABLET | Freq: Four times a day (QID) | ORAL | Status: DC | PRN

## 2014-01-08 NOTE — ED Notes (Signed)
Per pt sts bilateral leg pain and lower back pain.

## 2014-01-08 NOTE — Discharge Instructions (Signed)
Back Pain, Adult Low back pain is very common. About 1 in 5 people have back pain.The cause of low back pain is rarely dangerous. The pain often gets better over time.About half of people with a sudden onset of back pain feel better in just 2 weeks. About 8 in 10 people feel better by 6 weeks.  CAUSES Some common causes of back pain include:  Strain of the muscles or ligaments supporting the spine.  Wear and tear (degeneration) of the spinal discs.  Arthritis.  Direct injury to the back. DIAGNOSIS Most of the time, the direct cause of low back pain is not known.However, back pain can be treated effectively even when the exact cause of the pain is unknown.Answering your caregiver's questions about your overall health and symptoms is one of the most accurate ways to make sure the cause of your pain is not dangerous. If your caregiver needs more information, he or she may order lab work or imaging tests (X-rays or MRIs).However, even if imaging tests show changes in your back, this usually does not require surgery. HOME CARE INSTRUCTIONS For many people, back pain returns.Since low back pain is rarely dangerous, it is often a condition that people can learn to manageon their own.   Remain active. It is stressful on the back to sit or stand in one place. Do not sit, drive, or stand in one place for more than 30 minutes at a time. Take short walks on level surfaces as soon as pain allows.Try to increase the length of time you walk each day.  Do not stay in bed.Resting more than 1 or 2 days can delay your recovery.  Do not avoid exercise or work.Your body is made to move.It is not dangerous to be active, even though your back may hurt.Your back will likely heal faster if you return to being active before your pain is gone.  Pay attention to your body when you bend and lift. Many people have less discomfortwhen lifting if they bend their knees, keep the load close to their bodies,and  avoid twisting. Often, the most comfortable positions are those that put less stress on your recovering back.  Find a comfortable position to sleep. Use a firm mattress and lie on your side with your knees slightly bent. If you lie on your back, put a pillow under your knees.  Only take over-the-counter or prescription medicines as directed by your caregiver. Over-the-counter medicines to reduce pain and inflammation are often the most helpful.Your caregiver may prescribe muscle relaxant drugs.These medicines help dull your pain so you can more quickly return to your normal activities and healthy exercise.  Put ice on the injured area.  Put ice in a plastic bag.  Place a towel between your skin and the bag.  Leave the ice on for 15-20 minutes, 03-04 times a day for the first 2 to 3 days. After that, ice and heat may be alternated to reduce pain and spasms.  Ask your caregiver about trying back exercises and gentle massage. This may be of some benefit.  Avoid feeling anxious or stressed.Stress increases muscle tension and can worsen back pain.It is important to recognize when you are anxious or stressed and learn ways to manage it.Exercise is a great option. SEEK MEDICAL CARE IF:  You have pain that is not relieved with rest or medicine.  You have pain that does not improve in 1 week.  You have new symptoms.  You are generally not feeling well. SEEK   IMMEDIATE MEDICAL CARE IF:   You have pain that radiates from your back into your legs.  You develop new bowel or bladder control problems.  You have unusual weakness or numbness in your arms or legs.  You develop nausea or vomiting.  You develop abdominal pain.  You feel faint. Document Released: 04/20/2005 Document Revised: 10/20/2011 Document Reviewed: 08/22/2013 Exit[employer] Patient Information 2015 [employer]. This information is not intended to replace advice given to you by your health care provider. Make sure you  discuss any questions you have with your health care provider.  

## 2014-01-08 NOTE — ED Provider Notes (Signed)
CSN: [id number]     Arrival date & time 01/08/14  1304 History  This chart was scribed for non-physician practitioner [name], PA-C working with Ryan Acosta, MD by Ludger Nutting, ED Scribe. This patient was seen in room TR07C/TR07C and the patient's care was started at 3:54 PM.    Chief Complaint  Patient presents with  . Back Pain  . Leg Pain    The history is provided by the patient. No language interpreter was used.    HPI Comments: Ryan Hopkins is a 63 y.o. male with past medical history of DM, neuropathy who presents to the Emergency Department complaining of chronic, constant bilateral leg pain and lower back pain that worsened over the past few days. He states the pain is worse with movement. He has a history of prostate cancer and is scheduled to have surgery in a few weeks. No fevers, chills, bowel or bladder incontinence.  Past Medical History  Diagnosis Date  . Hypertension   . COPD (chronic obstructive pulmonary disease)   . Emphysema   . Enlarged prostate   . Degenerative arthritis of spine   . Back pain, chronic   . Diverticulitis   . Diabetes mellitus without complication   . Cancer   . Prostate cancer 11/2013   Past Surgical History  Procedure Laterality Date  . Back surgery      L4-L5  . Hernia repair     History reviewed. No pertinent family history. History  Substance Use Topics  . Smoking status: Current Some Day Smoker  . Smokeless tobacco: Not on file  . Alcohol Use: No    Review of Systems  Musculoskeletal: Positive for arthralgias and back pain.  Neurological: Negative for weakness.      Allergies  Vicodin  Home Medications   Prior to Admission medications   Medication Sig Start Date End Date Taking? Authorizing Provider  albuterol (PROVENTIL HFA;VENTOLIN HFA) 108 (90 BASE) MCG/ACT inhaler Inhale 2 puffs into the lungs every 6 (six) hours as needed for wheezing or shortness of breath.     Historical Provider, MD  albuterol  (PROVENTIL HFA;VENTOLIN HFA) 108 (90 BASE) MCG/ACT inhaler Inhale 1-2 puffs into the lungs every 6 (six) hours as needed for wheezing or shortness of breath. 12/13/13   [name], PA-C  aspirin EC 81 MG tablet Take 81 mg by mouth daily.    Historical Provider, MD  atorvastatin (LIPITOR) 20 MG tablet Take 20 mg by mouth daily.    Historical Provider, MD  cyclobenzaprine (FLEXERIL) 10 MG tablet Take 1 tablet (10 mg total) by mouth 3 (three) times daily as needed for muscle spasms. 07/09/14   Delora Fuel, MD  escitalopram (LEXAPRO) 10 MG tablet Take 10 mg by mouth daily.    Historical Provider, MD  Fluticasone-Salmeterol (ADVAIR) 250-50 MCG/DOSE AEPB Inhale 1 puff into the lungs every 12 (twelve) hours.    Historical Provider, MD  lisinopril-hydrochlorothiazide (PRINZIDE,ZESTORETIC) 20-12.5 MG per tablet Take 1 tablet by mouth daily.    Historical Provider, MD  metFORMIN (GLUCOPHAGE) 500 MG tablet Take 500 mg by mouth daily with breakfast.    Historical Provider, MD  ondansetron (ZOFRAN) 4 MG tablet Take 1 tablet (4 mg total) by mouth every 6 (six) hours. 11/26/13   Ryan Essex, MD  oxyCODONE-acetaminophen (PERCOCET) 7.5-325 MG per tablet Take 1 tablet by mouth every 4 (four) hours as needed for pain. 01/07/77   Delora Fuel, MD  oxyCODONE-acetaminophen (PERCOCET) 7.5-325 MG per tablet Take 1 tablet by  mouth every 4 (four) hours as needed for pain. 12/31/13   Ryan Sorrow, MD  oxyCODONE-acetaminophen (PERCOCET/ROXICET) 5-325 MG per tablet Take 2 tablets by mouth every 4 (four) hours as needed for severe pain. 11/26/13   Ryan Essex, MD  oxyCODONE-acetaminophen (PERCOCET/ROXICET) 5-325 MG per tablet Take 2 tablets by mouth every 4 (four) hours as needed for moderate pain or severe pain. 12/13/13   [name], PA-C  PRESCRIPTION MEDICATION Take 1 tablet by mouth 2 (two) times daily. Steroid medication from [facility]    Historical Provider, MD  tamsulosin (FLOMAX) 0.4 MG CAPS  capsule Take 0.4 mg by mouth daily.    Historical Provider, MD   BP 135/101  Pulse 99  Temp(Src) 98.3 F (36.8 C) (Oral)  Resp 20  Ht 5\' 7"  (1.702 m)  Wt 220 lb (99.791 kg)  BMI 34.45 kg/m2  SpO2 98% Physical Exam  Nursing note and vitals reviewed. Constitutional: He is oriented to person, place, and time. He appears well-developed and well-nourished. No distress.  HENT:  Head: Normocephalic and atraumatic.  Eyes: Conjunctivae and EOM are normal. Right eye exhibits no discharge. Left eye exhibits no discharge. No scleral icterus.  Neck: Normal range of motion. Neck supple. No tracheal deviation present.  Cardiovascular: Normal rate, regular rhythm and normal heart sounds.  Exam reveals no gallop and no friction rub.   No murmur heard. Pulmonary/Chest: Effort normal and breath sounds normal. No respiratory distress. He has no wheezes.  Abdominal: Soft. He exhibits no distension. There is no tenderness.  Musculoskeletal: Normal range of motion.  Right lumbar paraspinal muscles tender to palpation, no bony tenderness, step-offs, or gross abnormality or deformity of spine, patient is able to ambulate, moves all extremities  Bilateral great toe extension intact Bilateral plantar/dorsiflexion intact  Neurological: He is alert and oriented to person, place, and time. He has normal reflexes.  Sensation and strength intact bilaterally Symmetrical reflexes  Skin: Skin is warm and dry. He is not diaphoretic.  No abscess, cellulitis, or other abnormality about the right upper thigh  Psychiatric: He has a normal mood and affect. His behavior is normal. Judgment and thought content normal.    ED Course  Procedures (including critical care time)  DIAGNOSTIC STUDIES: Oxygen Saturation is 98% on RA, normal by my interpretation.    COORDINATION OF CARE: 4:00 PM Discussed treatment plan with pt at bedside and pt agreed to plan.   Labs Review Labs Reviewed - No data to display  Imaging  Review No results found.   EKG Interpretation None      MDM   Final diagnoses:  Right-sided low back pain with right-sided sciatica    Patient with back pain.  No neurological deficits and normal neuro exam.  Patient is ambulatory.  No loss of bowel or bladder control.  Doubt cauda equina.  Denies fever,  doubt epidural abscess or other lesion. Recommend back exercises, stretching, RICE, and will treat with a short course of percocet.  Encouraged the patient that there could be a need for additional workup and/or imaging such as MRI, if the symptoms do not resolve. Patient advised that if the back pain does not resolve, or radiates, this could progress to more serious conditions and is encouraged to follow-up with PCP or orthopedics within 2 weeks.    I personally performed the services described in this documentation, which was scribed in my presence. The recorded information has been reviewed and is accurate.     [name], PA-C  01/08/14 1626 

## 2014-01-08 NOTE — ED Notes (Signed)
Declined W/C at D/C and was escorted to lobby by RN. 

## 2014-01-10 NOTE — ED Provider Notes (Signed)
Medical screening examination/treatment/procedure(s) were performed by non-physician practitioner and as supervising physician I was immediately available for consultation/collaboration.    [name], MD 01/10/14 [phone]

## 2014-01-17 ENCOUNTER — Encounter: Payer: Self-pay | Admitting: Gastroenterology

## 2014-01-17 ENCOUNTER — Ambulatory Visit (INDEPENDENT_AMBULATORY_CARE_PROVIDER_SITE_OTHER): Payer: Medicare Other | Admitting: Gastroenterology

## 2014-01-17 VITALS — BP 106/90 | HR 84 | Ht 66.5 in | Wt 199.1 lb

## 2014-01-17 DIAGNOSIS — K625 Hemorrhage of anus and rectum: Secondary | ICD-10-CM

## 2014-01-17 DIAGNOSIS — Z800 Family history of malignant neoplasm of digestive organs: Secondary | ICD-10-CM

## 2014-01-17 DIAGNOSIS — R151 Fecal smearing: Secondary | ICD-10-CM

## 2014-01-17 MED ORDER — MOVIPREP 100 G PO SOLR: 1.0000 | Freq: Once | ORAL | Status: DC

## 2014-01-17 NOTE — Progress Notes (Signed)
HPI: This is a  very pleasant 63 year old man whom I am meeting for the first time today.   For many years he has had trouble with (12 years) leakage of stool, burning with BMs, sometimes pain with BM.  Intermittent blood on TP.  He had a colonoscopy in 2007 in [place], done for screening. Believes he had polyps removed.  He doesn't recall if he was told to have follow up.    Has tried prep H type OTC ointments without help, years ago.  Usually will have solid brown stools 1-2 times per day.  No rectal trauma.  Sister died of colon cancer.    Review of systems: Pertinent positive and negative review of systems were noted in the above HPI section. Complete review of systems was performed and was otherwise normal.    Past Medical History  Diagnosis Date  . Hypertension   . COPD (chronic obstructive pulmonary disease)   . Emphysema   . Enlarged prostate   . Degenerative arthritis of spine   . Back pain, chronic   . Diverticulitis   . Diabetes mellitus without complication   . Prostate cancer 11/2013    Past Surgical History  Procedure Laterality Date  . Lumbar disc surgery      L4-L5  . Inguinal hernia repair Right     Current Outpatient Prescriptions  Medication Sig Dispense Refill  . aspirin EC 81 MG tablet Take 81 mg by mouth daily.      Marland Kitchen lisinopril-hydrochlorothiazide (PRINZIDE,ZESTORETIC) 20-12.5 MG per tablet Take 1 tablet by mouth daily.      . metFORMIN (GLUCOPHAGE) 500 MG tablet Take 500 mg by mouth daily with breakfast.      . oxyCODONE-acetaminophen (PERCOCET) 7.5-325 MG per tablet Take 1 tablet by mouth every 4 (four) hours as needed for pain.  20 tablet  0   No current facility-administered medications for this visit.    Allergies as of 01/17/2014 - Review Complete 01/17/2014  Allergen Reaction Noted  . Vicodin [hydrocodone-acetaminophen]  10/16/2012    Family History  Problem Relation Age of Onset  . Colon cancer Sister   . Prostate cancer  Father   . Diabetes Mother   . Heart failure Mother   . Cancer Mother     cervical    History   Social History  . Marital Status: Legally Separated    Spouse Name: N/A    Number of Children: 3  . Years of Education: N/A   Occupational History  . Not on file.   Social History Main Topics  . Smoking status: Former Smoker    Quit date: 12/17/2013  . Smokeless tobacco: Never Used  . Alcohol Use: No  . Drug Use: No  . Sexual Activity: Not on file   Other Topics Concern  . Not on file   Social History Narrative  . No narrative on file       Physical Exam: BP 106/90  Pulse 84  Ht 5' 6.5" (1.689 m)  Wt 199 lb 2 oz (90.323 kg)  BMI 31.66 kg/m2 Constitutional: generally well-appearing Psychiatric: alert and oriented x3 Eyes: extraocular movements intact Mouth: oral pharynx moist, no lesions Neck: supple no lymphadenopathy Cardiovascular: heart regular rate and rhythm Lungs: clear to auscultation bilaterally Abdomen: soft, nontender, nondistended, no obvious ascites, no peritoneal signs, normal bowel sounds Extremities: no lower extremity edema bilaterally Skin: no lesions on visible extremities Rectal examination: Nonthrombosed external anal hemorrhoid tissue present, no obvious anal fissures, stool is brown and  not checked for Hemoccult, distal rectum was normal without any masses palpated   Assessment and plan: 63 y.o. male with many years of anal rectal discomfort, fecal soilage, intermittent rectal bleeding, family history of colon cancer  I did not detect anything on rectal exam the size some nonthrombosed external anal hemorrhoid tissue. Given his family history of colon cancer, chronic anal rectal symptoms I would like to see with colonoscopy at his soonest convenience. We will get this done well  before his October 14 scheduled prostatectomy.

## 2014-01-17 NOTE — Patient Instructions (Addendum)
You will be set up for a colonoscopy at [facility]. Please start taking citrucel (orange flavored) powder fiber supplement.  This may cause some bloating at first but that usually goes away. Begin with a small spoonful and work your way up to a large, heaping spoonful daily over a week.

## 2014-01-19 ENCOUNTER — Encounter (HOSPITAL_COMMUNITY): Payer: Self-pay | Admitting: Pharmacy Technician

## 2014-01-23 ENCOUNTER — Encounter (HOSPITAL_COMMUNITY): Payer: Self-pay | Admitting: *Deleted

## 2014-01-26 ENCOUNTER — Encounter (HOSPITAL_COMMUNITY): Payer: Self-pay | Admitting: Emergency Medicine

## 2014-01-26 ENCOUNTER — Emergency Department (HOSPITAL_COMMUNITY)
Admission: EM | Admit: 2014-01-26 | Discharge: 2014-01-27 | Disposition: A | Discharge status: Home / Self Care | Payer: Medicare Other | Attending: Emergency Medicine | Admitting: Emergency Medicine

## 2014-01-26 DIAGNOSIS — Z8719 Personal history of other diseases of the digestive system: Secondary | ICD-10-CM | POA: Insufficient documentation

## 2014-01-26 DIAGNOSIS — J438 Other emphysema: Secondary | ICD-10-CM | POA: Insufficient documentation

## 2014-01-26 DIAGNOSIS — Z8546 Personal history of malignant neoplasm of prostate: Secondary | ICD-10-CM | POA: Diagnosis not present

## 2014-01-26 DIAGNOSIS — Z79899 Other long term (current) drug therapy: Secondary | ICD-10-CM | POA: Diagnosis not present

## 2014-01-26 DIAGNOSIS — Z7982 Long term (current) use of aspirin: Secondary | ICD-10-CM | POA: Diagnosis not present

## 2014-01-26 DIAGNOSIS — I10 Essential (primary) hypertension: Secondary | ICD-10-CM | POA: Insufficient documentation

## 2014-01-26 DIAGNOSIS — G8929 Other chronic pain: Secondary | ICD-10-CM | POA: Diagnosis not present

## 2014-01-26 DIAGNOSIS — M479 Spondylosis, unspecified: Secondary | ICD-10-CM | POA: Diagnosis not present

## 2014-01-26 DIAGNOSIS — IMO0002 Reserved for concepts with insufficient information to code with codable children: Secondary | ICD-10-CM | POA: Insufficient documentation

## 2014-01-26 DIAGNOSIS — F17200 Nicotine dependence, unspecified, uncomplicated: Secondary | ICD-10-CM | POA: Diagnosis not present

## 2014-01-26 DIAGNOSIS — F1010 Alcohol abuse, uncomplicated: Secondary | ICD-10-CM | POA: Diagnosis present

## 2014-01-26 DIAGNOSIS — E119 Type 2 diabetes mellitus without complications: Secondary | ICD-10-CM | POA: Diagnosis not present

## 2014-01-26 LAB — COMPREHENSIVE METABOLIC PANEL
ALBUMIN: 4.1 g/dL (ref 3.5–5.2)
ALK PHOS: 70 U/L (ref 39–117)
ALT: 13 U/L (ref 0–53)
AST: 14 U/L (ref 0–37)
Anion gap: 13 (ref 5–15)
BUN: 11 mg/dL (ref 6–23)
CHLORIDE: 102 meq/L (ref 96–112)
CO2: 23 mEq/L (ref 19–32)
Calcium: 9.4 mg/dL (ref 8.4–10.5)
Creatinine, Ser: 1.45 mg/dL — ABNORMAL HIGH (ref 0.50–1.35)
GFR calc Af Amer: 57 mL/min — ABNORMAL LOW (ref >90)
GFR calc non Af Amer: 49 mL/min — ABNORMAL LOW (ref >90)
Glucose, Bld: 94 mg/dL (ref 70–99)
Potassium: 4 mEq/L (ref 3.7–5.3)
SODIUM: 138 meq/L (ref 137–147)
TOTAL PROTEIN: 7.3 g/dL (ref 6.0–8.3)
Total Bilirubin: 0.4 mg/dL (ref 0.3–1.2)

## 2014-01-26 LAB — CBC
HEMATOCRIT: 44.6 % (ref 39.0–52.0)
Hemoglobin: 15.4 g/dL (ref 13.0–17.0)
MCH: 30.7 pg (ref 26.0–34.0)
MCHC: 34.5 g/dL (ref 30.0–36.0)
MCV: 88.8 fL (ref 78.0–100.0)
PLATELETS: 270 10*3/uL (ref 150–400)
RBC: 5.02 MIL/uL (ref 4.22–5.81)
RDW: 14.8 % (ref 11.5–15.5)
WBC: 9.7 10*3/uL (ref 4.0–10.5)

## 2014-01-26 LAB — SALICYLATE LEVEL: Salicylate Lvl: <2 mg/dL — ABNORMAL LOW (ref 2.8–20.0)

## 2014-01-26 LAB — ACETAMINOPHEN LEVEL: Acetaminophen (Tylenol), Serum: 15 ug/mL (ref 10–30)

## 2014-01-26 NOTE — ED Notes (Signed)
Pt reports he has been drinking alcohol for about a month. Today he drank about 40 oz of beer. States he wants to start taking care of himself and wants alcohol detox. Denies SI/HI. Pt is calm and cooperative, NAD noted.

## 2014-01-26 NOTE — ED Notes (Signed)
Pt presents with request for help with detox from alcohol. Pt states he has been drinking heavily since being dx with prostate cancer, pt states he is unable to deal with thought of being castrated (education provided). Pt is due to have colonoscopy next week and would like to get is mind right first. Pt states it is a possibility he may have Colon CA as well. Pt state he lives alone and has limited support. Pt denies SI/Hi

## 2014-01-27 LAB — ETHANOL: Alcohol, Ethyl (B): <11 mg/dL (ref 0–11)

## 2014-01-27 LAB — CBG MONITORING, ED: Glucose-Capillary: 102 mg/dL — ABNORMAL HIGH (ref 70–99)

## 2014-01-27 MED ORDER — OXYCODONE-ACETAMINOPHEN 7.5-325 MG PO TABS: 1.0000 | ORAL_TABLET | ORAL | Status: DC | PRN

## 2014-01-27 MED ORDER — ONDANSETRON HCL 4 MG PO TABS: 4.0000 mg | ORAL_TABLET | Freq: Three times a day (TID) | ORAL | Status: DC | PRN

## 2014-01-27 MED ORDER — LISINOPRIL-HYDROCHLOROTHIAZIDE 20-12.5 MG PO TABS: 1.0000 | ORAL_TABLET | Freq: Every morning | ORAL | Status: DC

## 2014-01-27 MED ORDER — OXYCODONE-ACETAMINOPHEN 5-325 MG PO TABS: 1.0000 | ORAL_TABLET | ORAL | Status: DC | PRN

## 2014-01-27 MED ORDER — LISINOPRIL 20 MG PO TABS
20.0000 mg | ORAL_TABLET | Freq: Every day | ORAL | Status: DC
  Administered 2014-01-27: 20 mg via ORAL
  Filled 2014-01-27: qty 1

## 2014-01-27 MED ORDER — ZOLPIDEM TARTRATE 5 MG PO TABS: 5.0000 mg | ORAL_TABLET | Freq: Every evening | ORAL | Status: DC | PRN

## 2014-01-27 MED ORDER — ALBUTEROL SULFATE HFA 108 (90 BASE) MCG/ACT IN AERS
2.0000 | INHALATION_SPRAY | Freq: Four times a day (QID) | RESPIRATORY_TRACT | Status: DC | PRN
  Filled 2014-01-27: qty 6.7

## 2014-01-27 MED ORDER — IBUPROFEN 200 MG PO TABS: 600.0000 mg | ORAL_TABLET | Freq: Three times a day (TID) | ORAL | Status: DC | PRN

## 2014-01-27 MED ORDER — ASPIRIN EC 81 MG PO TBEC
81.0000 mg | DELAYED_RELEASE_TABLET | Freq: Every morning | ORAL | Status: DC
  Administered 2014-01-27: 81 mg via ORAL
  Filled 2014-01-27: qty 1

## 2014-01-27 MED ORDER — LORAZEPAM 1 MG PO TABS: 1.0000 mg | ORAL_TABLET | Freq: Three times a day (TID) | ORAL | Status: DC | PRN

## 2014-01-27 MED ORDER — ALUM & MAG HYDROXIDE-SIMETH 200-200-20 MG/5ML PO SUSP: 30.0000 mL | ORAL | Status: DC | PRN

## 2014-01-27 MED ORDER — METFORMIN HCL 500 MG PO TABS
500.0000 mg | ORAL_TABLET | Freq: Every day | ORAL | Status: DC
  Filled 2014-01-27: qty 1

## 2014-01-27 MED ORDER — OXYCODONE HCL 5 MG PO TABS: 2.5000 mg | ORAL_TABLET | ORAL | Status: DC | PRN

## 2014-01-27 MED ORDER — METFORMIN HCL 500 MG PO TABS
500.0000 mg | ORAL_TABLET | Freq: Every day | ORAL | Status: DC
  Administered 2014-01-27: 500 mg via ORAL
  Filled 2014-01-27 (×2): qty 1

## 2014-01-27 MED ORDER — NICOTINE 21 MG/24HR TD PT24
21.0000 mg | MEDICATED_PATCH | Freq: Every day | TRANSDERMAL | Status: DC
  Administered 2014-01-27: 21 mg via TRANSDERMAL
  Filled 2014-01-27: qty 1

## 2014-01-27 MED ORDER — ACETAMINOPHEN 325 MG PO TABS: 650.0000 mg | ORAL_TABLET | ORAL | Status: DC | PRN

## 2014-01-27 MED ORDER — MOMETASONE FURO-FORMOTEROL FUM 100-5 MCG/ACT IN AERO
2.0000 | INHALATION_SPRAY | Freq: Two times a day (BID) | RESPIRATORY_TRACT | Status: DC
  Administered 2014-01-27: 2 via RESPIRATORY_TRACT
  Filled 2014-01-27: qty 8.8

## 2014-01-27 MED ORDER — HYDROCHLOROTHIAZIDE 12.5 MG PO CAPS
12.5000 mg | ORAL_CAPSULE | Freq: Every day | ORAL | Status: DC
  Administered 2014-01-27: 12.5 mg via ORAL
  Filled 2014-01-27: qty 1

## 2014-01-27 NOTE — BH Assessment (Signed)
Assessment completed. Consulted Darlyne Russian, PA-C who recommended inpatient treatment. Pt should also follow up with AA meetings once he completes detox. Margarita Mail, PA-C has been notified of inpatient recommendation.

## 2014-01-27 NOTE — [facility] Suicide Risk Assessment (Signed)
Suicide Risk Assessment  Discharge Assessment     Demographic Factors:  Age 65 or older, Divorced or widowed and Living alone  Total Time spent with patient: 30 minutes  Psychiatric Specialty Exam:     Blood pressure 127/87, pulse 92, temperature 97.9 F (36.6 C), temperature source Oral, resp. rate 18, height 5\' 7"  (1.702 m), weight 99.791 kg (220 lb), SpO2 100.00%.Body mass index is 34.45 kg/(m^2).  General Appearance: Casual and Fairly Groomed  Engineer, water::  Good  Speech:  Clear and Coherent and Normal Rate  Volume:  Normal  Mood:  Anxious and Depressed  Affect:  Congruent and Depressed  Thought Process:  Coherent, Goal Directed and Intact  Orientation:  Full (Time, Place, and Person)  Thought Content:  WDL  Suicidal Thoughts:  No  Homicidal Thoughts:  no  Memory:  Immediate;   Good Recent;   Good Remote;   Good  Judgement:  Fair  Insight:  Fair  Psychomotor Activity:  Normal  Concentration:  Good  Recall:  NA  Fund of Knowledge:Good  Language: Good  Akathisia:  NA  Handed:  Right  AIMS (if indicated):     Assets:  Desire for Improvement  Sleep:       Musculoskeletal: Strength & Muscle Tone: within normal limits Gait & Station: normal Patient leans: N/A   Mental Status Per Nursing Assessment::   On Admission:     Current Mental Status by Physician: NA  Loss Factors: NA  Historical Factors: NA  Risk Reduction Factors:   NA  Continued Clinical Symptoms:  Depression:   Insomnia  Cognitive Features That Contribute To Risk:  Polarized thinking    Suicide Risk:  Minimal: No identifiable suicidal ideation.  Patients presenting with no risk factors but with morbid ruminations; may be classified as minimal risk based on the severity of the depressive symptoms  Discharge Diagnoses:   AXIS I:  Depressive Disorder NOS and alcohol abuse AXIS II:  Deferred AXIS III:   Past Medical History  Diagnosis Date  . Hypertension   . COPD (chronic  obstructive pulmonary disease)   . Emphysema   . Enlarged prostate   . Degenerative arthritis of spine   . Back pain, chronic   . Diverticulitis   . Diabetes mellitus without complication   . Anal fissure   . Prostate cancer 11/2013   AXIS IV:  other psychosocial or environmental problems and problems related to social environment AXIS V:  61-70 mild symptoms  Plan Of Care/Follow-up recommendations:  Activity:  As tolerated Diet:  regular  Is patient on multiple antipsychotic therapies at discharge:  No   Has Patient had three or more failed trials of antipsychotic monotherapy by history:  No  Recommended Plan for Multiple Antipsychotic Therapies: NA    Charmaine Downs, C   PMHNP-BC 01/27/2014, 3:55 PM

## 2014-01-27 NOTE — ED Notes (Signed)
Bed: WA29 Expected date:  Expected time:  Means of arrival:  Comments: Hold for [employer]

## 2014-01-27 NOTE — ED Notes (Signed)
MD at bedside. 

## 2014-01-27 NOTE — Consult Note (Signed)
Ryan Hopkins Consult   Reason for Consult: Depressive disorder, Alcohol use disorder Referring Physician:  EDP Ryan Hopkins is an 63 y.o. male. Total Time spent with patient: 1 hour  Assessment: AXIS I:  Depressive Disorder NOS and Alcohol abuse AXIS II:  Deferred AXIS III:   Past Medical History  Diagnosis Date  . Hypertension   . COPD (chronic obstructive pulmonary disease)   . Emphysema   . Enlarged prostate   . Degenerative arthritis of spine   . Back pain, chronic   . Diverticulitis   . Diabetes mellitus without complication   . Anal fissure   . Prostate cancer 11/2013   AXIS IV:  other psychosocial or environmental problems and problems related to social environment AXIS V:  61-70 mild symptoms  Plan:  No evidence of imminent risk to self or others at present.   Patient does not meet criteria for psychiatric inpatient admission. Discussed crisis plan, support from social network, calling 911, coming to the Emergency Department, and calling Suicide Hotline.  Subjective:   Ryan Hopkins is a 63 y.o. male patient evaluated for Alcohol abuse and depressive disorder.  HPI:  AA male, 63 years old was seen seeking Alcohol detox treatment.  Patient denies any depressive feelings or drinking until he was told he has cancer of the prostate and he will be needing surgery.  Patient states he started drinking heavily in the last one month because he thought his life was finished.  Patient states that he thought the surgery meant he was going to lose all of his "man hood"  He states he last drank yesterday.  Patient states that while in the ER he met a young man who explained what Prostates cancer surgery meant.  Now he is going to have the surgery and will not be drinking anymore.  Patient denies any previous Psychiatric hospitalization, seeing any therapist or outpatient Psychiatrist.  He denies taking any antipsychotic or depressants.  Patient denies any drug use.  He  denies SI/HI/AVH.  He denied alcohol withdrawal symptoms.  He has agreed to go home and take care of his Prostate surgery.  Patient was advised to follow up with out patient Psychiatrist and therapist after his surgery.  HPI Elements:   Location:  Alcohol Abuse, depressive disorder. Quality:  ANXIETY, INSOMNIA, Knowledge deficit. Severity:  moderate. Context:  Feeling anxious about a pending surgery for prostate surgery.  Past Psychiatric History: Past Medical History  Diagnosis Date  . Hypertension   . COPD (chronic obstructive pulmonary disease)   . Emphysema   . Enlarged prostate   . Degenerative arthritis of spine   . Back pain, chronic   . Diverticulitis   . Diabetes mellitus without complication   . Anal fissure   . Prostate cancer 11/2013    reports that he has been smoking Cigarettes.  He has a 20 pack-year smoking history. He has never used smokeless tobacco. He reports that he drinks alcohol. He reports that he uses illicit drugs (Marijuana). Family History  Problem Relation Age of Onset  . Colon cancer Sister   . Prostate cancer Father   . Diabetes Mother   . Heart failure Mother   . Cancer Mother     cervical   Family History Substance Abuse: No Family Supports: No Living Arrangements: Alone Can pt return to current living arrangement?: Yes Abuse/Neglect [facility]) Physical Abuse: Denies Verbal Abuse: Denies Sexual Abuse: Denies Allergies:   Allergies  Allergen Reactions  . Vicodin [Hydrocodone-Acetaminophen]  PT. STATED IT MAKES HIM NAUSEATED .    ACT Assessment Complete:  Yes:    Educational Status    Risk to Self: Risk to self with the past 6 months Suicidal Ideation: No Suicidal Intent: No Is patient at risk for suicide?: No Suicidal Plan?: No Access to Means: No What has been your use of drugs/alcohol within the last 12 months?: Daily alcohol use for the past month.  Previous Attempts/Gestures: No How many times?: 0 Other Self Harm Risks: No  other self harm risk identified at this time. Triggers for Past Attempts: None known Intentional Self Injurious Behavior: None Family Suicide History: No Recent stressful life event(s): Other (Comment) (Diagnosis of prostate cancer) Persecutory voices/beliefs?: No Depression: No Depression Symptoms: Tearfulness;Insomnia;Fatigue;Feeling worthless/self pity Substance abuse history and/or treatment for substance abuse?: Yes Suicide prevention information given to non-admitted patients: Not applicable  Risk to Others: Risk to Others within the past 6 months Homicidal Ideation: No Thoughts of Harm to Others: No Current Homicidal Intent: No Current Homicidal Plan: No Access to Homicidal Means: No Identified Victim: NA History of harm to others?: No Assessment of Violence: None Noted Violent Behavior Description: No violent behaviors reported. Pt is calm and cooperative at this time.  Does patient have access to weapons?: No Criminal Charges Pending?: No Does patient have a court date: No  Abuse: Abuse/Neglect Assessment (Assessment to be complete while patient is alone) Physical Abuse: Denies Verbal Abuse: Denies Sexual Abuse: Denies Exploitation of patient/patient's resources: Denies Self-Neglect: Denies  Prior Inpatient Therapy: Prior Inpatient Therapy Prior Inpatient Therapy: No  Prior Outpatient Therapy: Prior Outpatient Therapy Prior Outpatient Therapy: No  Additional Information: Additional Information 1:1 In Past 12 Months?: No CIRT Risk: No Elopement Risk: No  bjective: Blood pressure 127/87, pulse 92, temperature 97.9 F (36.6 C), temperature source Oral, resp. rate 18, height '5\' 7"'  (1.702 m), weight 99.791 kg (220 lb), SpO2 100.00%.Body mass index is 34.45 kg/(m^2). Results for orders placed during the hospital encounter of 01/26/14 (from the past 72 hour(s))  ACETAMINOPHEN LEVEL     Status: None   Collection Time    01/26/14  8:41 PM      Result Value Ref Range    Acetaminophen (Tylenol), Serum 15.0  10 - 30 ug/mL  CBC     Status: None   Collection Time    01/26/14  8:41 PM      Result Value Ref Range   WBC 9.7  4.0 - 10.5 K/uL   RBC 5.02  4.22 - 5.81 MIL/uL   Hemoglobin 15.4  13.0 - 17.0 g/dL   HCT 44.6  39.0 - 52.0 %   MCV 88.8  78.0 - 100.0 fL   MCH 30.7  26.0 - 34.0 pg   MCHC 34.5  30.0 - 36.0 g/dL   RDW 14.8  11.5 - 15.5 %   Platelets 270  150 - 400 K/uL  COMPREHENSIVE METABOLIC PANEL     Status: Abnormal   Collection Time    01/26/14  8:41 PM      Result Value Ref Range   Sodium 138  137 - 147 mEq/L   Potassium 4.0  3.7 - 5.3 mEq/L   Chloride 102  96 - 112 mEq/L   CO2 23  19 - 32 mEq/L   Glucose, Bld 94  70 - 99 mg/dL   BUN 11  6 - 23 mg/dL   Creatinine, Ser 1.45 (*) 0.50 - 1.35 mg/dL   Calcium 9.4  8.4 - 10.5  mg/dL   Total Protein 7.3  6.0 - 8.3 g/dL   Albumin 4.1  3.5 - 5.2 g/dL   AST 14  0 - 37 U/L   ALT 13  0 - 53 U/L   Alkaline Phosphatase 70  39 - 117 U/L   Total Bilirubin 0.4  0.3 - 1.2 mg/dL   GFR calc non Af Amer 49 (*) >90 mL/min   GFR calc Af Amer 57 (*) >90 mL/min   Comment: (NOTE)     The eGFR has been calculated using the CKD EPI equation.     This calculation has not been validated in all clinical situations.     eGFR's persistently <90 mL/min signify possible Chronic Kidney     Disease.   Anion gap 13  5 - 15  ETHANOL     Status: None   Collection Time    01/26/14  8:41 PM      Result Value Ref Range   Alcohol, Ethyl (B) <11  0 - 11 mg/dL   Comment:            LOWEST DETECTABLE LIMIT FOR     SERUM ALCOHOL IS 11 mg/dL     FOR MEDICAL PURPOSES ONLY     CORRECTED ON 09/26 AT 0545: PREVIOUSLY REPORTED AS <56  SALICYLATE LEVEL     Status: Abnormal   Collection Time    01/26/14  8:41 PM      Result Value Ref Range   Salicylate Lvl <1.5 (*) 2.8 - 20.0 mg/dL  CBG MONITORING, ED     Status: Abnormal   Collection Time    01/27/14  9:58 AM      Result Value Ref Range   Glucose-Capillary 102 (*) 70 - 99  mg/dL   Comment 1 Documented in Chart     Comment 2 Notify RN     Labs are reviewed and are pertinent for Unremarkable lab results.  Current Facility-Administered Medications  Medication Dose Route Frequency Provider Last Rate Last Dose  . acetaminophen (TYLENOL) tablet 650 mg  650 mg Oral Q4H PRN Margarita Mail, PA-C      . albuterol (PROVENTIL HFA;VENTOLIN HFA) 108 (90 BASE) MCG/ACT inhaler 2 puff  2 puff Inhalation Q6H PRN Orpah Greek, MD      . alum & mag hydroxide-simeth (MAALOX/MYLANTA) 200-200-20 MG/5ML suspension 30 mL  30 mL Oral PRN Margarita Mail, PA-C      . aspirin EC tablet 81 mg  81 mg Oral q morning - 10a Orpah Greek, MD   81 mg at 01/27/14 1120  . lisinopril (PRINIVIL,ZESTRIL) tablet 20 mg  20 mg Oral Daily Orpah Greek, MD   20 mg at 01/27/14 1121   And  . hydrochlorothiazide (MICROZIDE) capsule 12.5 mg  12.5 mg Oral Daily Orpah Greek, MD   12.5 mg at 01/27/14 1121  . ibuprofen (ADVIL,MOTRIN) tablet 600 mg  600 mg Oral Q8H PRN Margarita Mail, PA-C      . LORazepam (ATIVAN) tablet 1 mg  1 mg Oral Q8H PRN Margarita Mail, PA-C      . metFORMIN (GLUCOPHAGE) tablet 500 mg  500 mg Oral Q breakfast Orpah Greek, MD   500 mg at 01/27/14 1121  . mometasone-formoterol (DULERA) 100-5 MCG/ACT inhaler 2 puff  2 puff Inhalation BID Orpah Greek, MD   2 puff at 01/27/14 1115  . nicotine (NICODERM CQ - dosed in mg/24 hours) patch 21 mg  21 mg Transdermal Daily Abigail  Harris, PA-C   21 mg at 01/27/14 1121  . ondansetron (ZOFRAN) tablet 4 mg  4 mg Oral Q8H PRN Margarita Mail, PA-C      . oxyCODONE-acetaminophen (PERCOCET/ROXICET) 5-325 MG per tablet 1 tablet  1 tablet Oral Q4H PRN Orpah Greek, MD       And  . oxyCODONE (Oxy IR/ROXICODONE) immediate release tablet 2.5 mg  2.5 mg Oral Q4H PRN Orpah Greek, MD      . zolpidem (AMBIEN) tablet 5 mg  5 mg Oral QHS PRN Margarita Mail, PA-C       Current Outpatient  Prescriptions  Medication Sig Dispense Refill  . albuterol (PROVENTIL HFA;VENTOLIN HFA) 108 (90 BASE) MCG/ACT inhaler Inhale 2 puffs into the lungs every 6 (six) hours as needed for wheezing or shortness of breath.      Marland Kitchen aspirin EC 81 MG tablet Take 81 mg by mouth every morning.       . fluticasone-salmeterol (ADVAIR HFA) 115-21 MCG/ACT inhaler Inhale 2 puffs into the lungs 2 (two) times daily.      Marland Kitchen lisinopril-hydrochlorothiazide (PRINZIDE,ZESTORETIC) 20-12.5 MG per tablet Take 1 tablet by mouth every morning.       . metFORMIN (GLUCOPHAGE) 500 MG tablet Take 500 mg by mouth daily with breakfast.      . oxyCODONE-acetaminophen (PERCOCET) 7.5-325 MG per tablet Take 1 tablet by mouth every 4 (four) hours as needed for pain.  20 tablet  0  . zolpidem (AMBIEN) 5 MG tablet Take 5 mg by mouth at bedtime as needed for sleep.        Psychiatric Specialty Exam:     Blood pressure 127/87, pulse 92, temperature 97.9 F (36.6 C), temperature source Oral, resp. rate 18, height '5\' 7"'  (1.702 m), weight 99.791 kg (220 lb), SpO2 100.00%.Body mass index is 34.45 kg/(m^2).  General Appearance: Casual and Fairly Groomed  Engineer, water::  Good  Speech:  Clear and Coherent and Normal Rate  Volume:  Normal  Mood:  Anxious and Depressed  Affect:  Congruent and Depressed  Thought Process:  Coherent, Goal Directed and Intact  Orientation:  Full (Time, Place, and Person)  Thought Content:  WDL  Suicidal Thoughts:  No  Homicidal Thoughts:  No  Memory:  Immediate;   Good  Judgement:  Fair  Insight:  Fair  Psychomotor Activity:  Normal  Concentration:  Good  Recall:  NA  Fund of Knowledge:Good  Language: Good  Akathisia:  NA  Handed:  Right  AIMS (if indicated):     Assets:  Desire for Improvement  Sleep:      Musculoskeletal: Strength & Muscle Tone: within normal limits Gait & Station: normal Patient leans: N/A  Treatment Plan Summary: Consult with Dr Adele Schilder who agrres that patient can be  discharged to follow up with outpatient Psychiatrist.  Patient however walked out with his discharge papers Patient will be discharged home.  Charmaine Downs, C  PMHNP-BC 01/27/2014 3:28 PM  I have personally seen the patient and agreed with the findings and involved in the treatment plan. Berniece Andreas, MD

## 2014-01-27 NOTE — ED Notes (Addendum)
Pt decided to leave without d/c papers.  Md made aware.  Pt ok to d/c.  Dispo order pending.  Pt did not stay for d/c vitals

## 2014-01-27 NOTE — ED Notes (Signed)
Belongings place in locker 34. Three white bags and book bag.

## 2014-01-27 NOTE — Discharge Summary (Signed)
Physician Discharge Summary Note  Patient:  Ryan Hopkins is an 63 y.o., male MRN:  [id number] DOB:  11-14-1950 Patient phone:  [phone] (home)  Patient address:   [address] [place] [address],  Total Time spent with patient: 30 minutes  Date of Admission:  01/26/2014 Date of Discharge: 01/27/2014  Discharge Diagnoses: Active Problems:   * No active hospital problems. *   Psychiatric Specialty Exam: Physical Exam  ROS  Blood pressure 127/87, pulse 92, temperature 97.9 F (36.6 C), temperature source Oral, resp. rate 18, height 5' 7" (1.702 m), weight 99.791 kg (220 lb), SpO2 100.00%.Body mass index is 34.45 kg/(m^2).  General Appearance: Casual and Fairly Groomed  Eye Contact::  Good  Speech:  Clear and Coherent and Normal Rate  Volume:  Normal  Mood:  Depressed  Affect:  Congruent and Depressed  Thought Process:  Coherent, Goal Directed and Intact  Orientation:  Full (Time, Place, and Person)  Thought Content:  WDL  Suicidal Thoughts:  No  Homicidal Thoughts:  No  Memory:  Immediate;   Good Recent;   Good Remote;   Good  Judgement:  Fair  Insight:  Fair  Psychomotor Activity:  Normal  Concentration:  Good  Recall:  NA  Fund of Knowledge:Good  Language: Good  Akathisia:  NA  Handed:  Right  AIMS (if indicated):     Assets:  Desire for Improvement  Sleep:       Past Psychiatric History: Diagnosis:  Hospitalizations:  Outpatient Care:  Substance Abuse Care:  Self-Mutilation:  Suicidal Attempts:  Violent Behaviors:   Musculoskeletal: Strength & Muscle Tone: within normal limits Gait & Station: normal Patient leans: N/A  DSM5:   Axis Diagnosis:   AXIS I:  Depressive Disorder NOS and Alcohol dependence AXIS II:  Deferred AXIS III:   Past Medical History  Diagnosis Date  . Hypertension   . COPD (chronic obstructive pulmonary disease)   . Emphysema   . Enlarged prostate   . Degenerative arthritis of spine   . Back pain, chronic   .  Diverticulitis   . Diabetes mellitus without complication   . Anal fissure   . Prostate cancer 11/2013   AXIS IV:  other psychosocial or environmental problems and problems related to social environment AXIS V:  61-70 mild symptoms  Level of Care:  OP, Patient to follow up with outpatient Psychiatric provider  ER course:   AA male, 63 years old was seen seeking Alcohol detox treatment.  Patient denies any depressive feelings or drinking until he was told he has cancer of the prostate and he will be needing surgery.  Patient states he started drinking heavily in the last one month because he thought his life was finished.  Patient states that he thought the surgery meant he was going to lose all of his "man hood"  He states he last drank yesterday.  Patient states that while in the ER he met a young man who explained what Prostates cancer surgery meant.  Now he is going to have the surgery and will not be drinking anymore.  Patient denies any previous Psychiatric hospitalization, seeing any therapist or outpatient Psychiatrist.  He denies taking any antipsychotic or depressants.  Patient denies any drug use.  He denies SI/HI/AVH.  He denied alcohol withdrawal symptoms.  He has agreed to go home and take care of his Prostate surgery.  Patient was advised to follow up with out patient Psychiatrist and therapist after his surgery.  Significant Diagnostic Studies:  labs: Alcohol level, cmp, cbc, UDS  Discharge Vitals:   Blood pressure 127/87, pulse 92, temperature 97.9 F (36.6 C), temperature source Oral, resp. rate 18, height 5' 7" (1.702 m), weight 99.791 kg (220 lb), SpO2 100.00%. Body mass index is 34.45 kg/(m^2). Lab Results:   Results for orders placed during the hospital encounter of 01/26/14 (from the past 72 hour(s))  ACETAMINOPHEN LEVEL     Status: None   Collection Time    01/26/14  8:41 PM      Result Value Ref Range   Acetaminophen (Tylenol), Serum 15.0  10 - 30 ug/mL  CBC      Status: None   Collection Time    01/26/14  8:41 PM      Result Value Ref Range   WBC 9.7  4.0 - 10.5 K/uL   RBC 5.02  4.22 - 5.81 MIL/uL   Hemoglobin 15.4  13.0 - 17.0 g/dL   HCT 44.6  39.0 - 52.0 %   MCV 88.8  78.0 - 100.0 fL   MCH 30.7  26.0 - 34.0 pg   MCHC 34.5  30.0 - 36.0 g/dL   RDW 14.8  11.5 - 15.5 %   Platelets 270  150 - 400 K/uL  COMPREHENSIVE METABOLIC PANEL     Status: Abnormal   Collection Time    01/26/14  8:41 PM      Result Value Ref Range   Sodium 138  137 - 147 mEq/L   Potassium 4.0  3.7 - 5.3 mEq/L   Chloride 102  96 - 112 mEq/L   CO2 23  19 - 32 mEq/L   Glucose, Bld 94  70 - 99 mg/dL   BUN 11  6 - 23 mg/dL   Creatinine, Ser 1.45 (*) 0.50 - 1.35 mg/dL   Calcium 9.4  8.4 - 10.5 mg/dL   Total Protein 7.3  6.0 - 8.3 g/dL   Albumin 4.1  3.5 - 5.2 g/dL   AST 14  0 - 37 U/L   ALT 13  0 - 53 U/L   Alkaline Phosphatase 70  39 - 117 U/L   Total Bilirubin 0.4  0.3 - 1.2 mg/dL   GFR calc non Af Amer 49 (*) >90 mL/min   GFR calc Af Amer 57 (*) >90 mL/min   Comment: (NOTE)     The eGFR has been calculated using the CKD EPI equation.     This calculation has not been validated in all clinical situations.     eGFR's persistently <90 mL/min signify possible Chronic Kidney     Disease.   Anion gap 13  5 - 15  ETHANOL     Status: None   Collection Time    01/26/14  8:41 PM      Result Value Ref Range   Alcohol, Ethyl (B) <11  0 - 11 mg/dL   Comment:            LOWEST DETECTABLE LIMIT FOR     SERUM ALCOHOL IS 11 mg/dL     FOR MEDICAL PURPOSES ONLY     CORRECTED ON 09/26 AT 0545: PREVIOUSLY REPORTED AS <73  SALICYLATE LEVEL     Status: Abnormal   Collection Time    01/26/14  8:41 PM      Result Value Ref Range   Salicylate Lvl <5.3 (*) 2.8 - 20.0 mg/dL  CBG MONITORING, ED     Status: Abnormal   Collection Time  01/27/14  9:58 AM      Result Value Ref Range   Glucose-Capillary 102 (*) 70 - 99 mg/dL   Comment 1 Documented in Chart     Comment 2 Notify RN       Physical Findings: AIMS:  , ,  ,  ,    CIWA:  CIWA-Ar Total: 0 COWS:     Psychiatric Specialty Exam: See Psychiatric Specialty Exam and Suicide Risk Assessment completed by Attending Physician prior to discharge.  Discharge destination:  Home  Is patient on multiple antipsychotic therapies at discharge:  No   Has Patient had three or more failed trials of antipsychotic monotherapy by history:  No  Recommended Plan for Multiple Antipsychotic Therapies: NA  Discharge Instructions   Diet - low sodium heart healthy    Complete by:  As directed      Discharge instructions    Complete by:  As directed   Follow  Up with outpatient psychiatrist and therapist     Increase activity slowly    Complete by:  As directed             Medication List       Indication   albuterol 108 (90 BASE) MCG/ACT inhaler  Commonly known as:  PROVENTIL HFA;VENTOLIN HFA  Inhale 2 puffs into the lungs every 6 (six) hours as needed for wheezing or shortness of breath.      aspirin EC 81 MG tablet  Take 81 mg by mouth every morning.      fluticasone-salmeterol 115-21 MCG/ACT inhaler  Commonly known as:  ADVAIR HFA  Inhale 2 puffs into the lungs 2 (two) times daily.      lisinopril-hydrochlorothiazide 20-12.5 MG per tablet  Commonly known as:  PRINZIDE,ZESTORETIC  Take 1 tablet by mouth every morning.      metFORMIN 500 MG tablet  Commonly known as:  GLUCOPHAGE  Take 500 mg by mouth daily with breakfast.      oxyCODONE-acetaminophen 7.5-325 MG per tablet  Commonly known as:  PERCOCET  Take 1 tablet by mouth every 4 (four) hours as needed for pain.      zolpidem 5 MG tablet  Commonly known as:  AMBIEN  Take 5 mg by mouth at bedtime as needed for sleep.          Follow-up recommendations:  Activity:  As tolerated Diet:  regular  Comments:  Discharged home.  Total Discharge Time:  Greater than 30 minutes.  SignedDelfin Gant  PMHNP-BC 01/27/2014, 3:59 PM  I have  personally seen the patient and agreed with the findings and involved in the treatment plan. Berniece Andreas, MD

## 2014-01-27 NOTE — ED Notes (Signed)
Pt called RN to room.  Pt upset with dispo for d/c home.  Pt requesting clothes.  Pt is being d/c by MD and is waiting paperwork.  Pt refused staying for papers.  Md notified of pt departure.

## 2014-01-27 NOTE — ED Provider Notes (Signed)
CSN: [id number]     Arrival date & time 01/26/14  1948 History   First MD Initiated Contact with Patient 01/26/14 2356     Chief Complaint  Patient presents with  . Alcohol Problem     (Consider location/radiation/quality/duration/timing/severity/associated sxs/prior Treatment) HPI  Ryan Hopkins He is a 63 year old male who presents the emergency department for alcohol abuse. The patient was diagnosed with prostate cancer about one month ago. The patient states that he thought he was going to be castrated and has been drinking approximately 340 ounce beers daily since that time. He denies a history of alcohol abuse or regular alcohol use. He denies any other drug abuse. Patient states that he has been drinking daily, not taking his medications or eating appropriately. He denies suicidal ideation, homicidal ideation, or audiovisual hallucinations. Denies history of any other psychiatric disorders or hospitalizations. Patient states that he he does not have any close family here and he did not have anyone to turn to. He denies waking up and shakes her history of seizure Denies fevers, chills, myalgias, arthralgias. Denies DOE, SOB, chest tightness or pressure, radiation to left arm, jaw or back, or diaphoresis. Denies dysuria, flank pain, suprapubic pain, frequency, urgency, or hematuria. Denies headaches, light headedness, weakness, visual disturbances. Denies abdominal pain, nausea, vomiting, diarrhea or constipation.    Past Medical History  Diagnosis Date  . Hypertension   . COPD (chronic obstructive pulmonary disease)   . Emphysema   . Enlarged prostate   . Degenerative arthritis of spine   . Back pain, chronic   . Diverticulitis   . Diabetes mellitus without complication   . Anal fissure   . Prostate cancer 11/2013   Past Surgical History  Procedure Laterality Date  . Lumbar disc surgery  2007    L4-L5  . Inguinal hernia repair Right years ago   Family History  Problem  Relation Age of Onset  . Colon cancer Sister   . Prostate cancer Father   . Diabetes Mother   . Heart failure Mother   . Cancer Mother     cervical   History  Substance Use Topics  . Smoking status: Current Every Day Smoker -- 1.00 packs/day for 20 years    Types: Cigarettes  . Smokeless tobacco: Never Used  . Alcohol Use: Yes     Comment: 2-3 40's daily    Review of Systems  Ten systems reviewed and are negative for acute change, except as noted in the HPI.    Allergies  Vicodin  Home Medications   Prior to Admission medications   Medication Sig Start Date End Date Taking? Authorizing Provider  albuterol (PROVENTIL HFA;VENTOLIN HFA) 108 (90 BASE) MCG/ACT inhaler Inhale 2 puffs into the lungs every 6 (six) hours as needed for wheezing or shortness of breath.   Yes Historical Provider, MD  aspirin EC 81 MG tablet Take 81 mg by mouth every morning.    Yes Historical Provider, MD  fluticasone-salmeterol (ADVAIR HFA) 115-21 MCG/ACT inhaler Inhale 2 puffs into the lungs 2 (two) times daily.   Yes Historical Provider, MD  lisinopril-hydrochlorothiazide (PRINZIDE,ZESTORETIC) 20-12.5 MG per tablet Take 1 tablet by mouth every morning.    Yes Historical Provider, MD  metFORMIN (GLUCOPHAGE) 500 MG tablet Take 500 mg by mouth daily with breakfast.   Yes Historical Provider, MD  oxyCODONE-acetaminophen (PERCOCET) 7.5-325 MG per tablet Take 1 tablet by mouth every 4 (four) hours as needed for pain. 12/31/13  Yes [name], MD  zolpidem Lorrin Mais)  5 MG tablet Take 5 mg by mouth at bedtime as needed for sleep.   Yes Historical Provider, MD   BP 146/99  Pulse 78  Temp(Src) 98 F (36.7 C) (Oral)  Resp 16  Ht 5\' 7"  (1.702 m)  Wt 220 lb (99.791 kg)  BMI 34.45 kg/m2  SpO2 98% Physical Exam  Nursing note and vitals reviewed. Constitutional: He appears well-developed and well-nourished. No distress.  HENT:  Head: Normocephalic and atraumatic.  Eyes: Conjunctivae are normal. No scleral  icterus.  Neck: Normal range of motion. Neck supple.  Cardiovascular: Normal rate, regular rhythm and normal heart sounds.   Pulmonary/Chest: Effort normal and breath sounds normal. No respiratory distress.  Abdominal: Soft. There is no tenderness.  Musculoskeletal: He exhibits no edema.  Neurological: He is alert.  Skin: Skin is warm and dry. He is not diaphoretic.  Psychiatric: His behavior is normal.    ED Course  Procedures (including critical care time) Labs Review Labs Reviewed  COMPREHENSIVE METABOLIC PANEL - Abnormal; Notable for the following:    Creatinine, Ser 1.45 (*)    GFR calc non Af Amer 49 (*)    GFR calc Af Amer 57 (*)    All other components within normal limits  SALICYLATE LEVEL - Abnormal; Notable for the following:    Salicylate Lvl <2.6 (*)    All other components within normal limits  ACETAMINOPHEN LEVEL  CBC  ETHANOL  URINE RAPID DRUG SCREEN (HOSP PERFORMED)    Imaging Review No results found.   EKG Interpretation None      MDM   Final diagnoses:  Alcohol abuse    Patient with ETOH abuse. Appears medically clear for psych consult.   i have spoken with TTS consultant who will look for placement.   Margarita Mail, PA-C 01/28/14 [phone]

## 2014-01-27 NOTE — ED Notes (Signed)
Pt alert x4, v/s stable, calm and cooperative will continue to monitor.

## 2014-01-27 NOTE — BH Assessment (Signed)
Tele Assessment Note   Ryan Hopkins is an 63 y.o. male.   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 Hypertension   . COPD (chronic obstructive pulmonary disease)   . Emphysema   . Enlarged prostate   . Degenerative arthritis of spine   . Back pain, chronic   . Diverticulitis   . Diabetes mellitus without complication   . Anal fissure   . Prostate cancer 11/2013   Axis IV: problems with primary support group Axis V: 41-50 serious symptoms  Past Medical History:  Past Medical History  Diagnosis Date  . Hypertension   . COPD (chronic obstructive pulmonary disease)   . Emphysema   . Enlarged prostate   . Degenerative arthritis of spine   . Back pain, chronic   . Diverticulitis   . Diabetes mellitus without complication   . Anal fissure   . Prostate cancer 11/2013    Past Surgical History  Procedure Laterality Date  . Lumbar disc surgery  2007    L4-L5  . Inguinal hernia repair Right years ago    Family History:  Family History  Problem Relation Age of Onset  . Colon cancer Sister   . Prostate cancer Father   . Diabetes Mother   . Heart failure Mother   . Cancer Mother     cervical    Social History:  reports that he has been smoking Cigarettes.  He has a 20 pack-year smoking history. He has never used smokeless tobacco. He reports that he drinks alcohol. He reports that he uses illicit drugs (Marijuana).  Additional Social History:  Alcohol / Drug Use History of alcohol / drug use?: Yes Longest period of sobriety (when/how long): 20 years  Substance #1 Name of Substance 1: Alcohol  1 - Age of First Use: Teens  1 - Amount (size/oz): 1-40oz 1 - Frequency: daily  1 - Duration: 1 month  1 - Last Use / Amount: 01-26-14 2-40oz  CIWA: CIWA-Ar BP: 146/99 mmHg Pulse Rate: 78 Nausea and Vomiting: no nausea and no vomiting Tactile Disturbances: none Tremor: no  tremor Auditory Disturbances: not present Paroxysmal Sweats: no sweat visible Visual Disturbances: not present Anxiety: no anxiety, at ease Headache, Fullness in Head: none present Agitation: normal activity Orientation and Clouding of Sensorium: oriented and can do serial additions CIWA-Ar Total: 0 COWS:    PATIENT STRENGTHS: (choose at least two) Average or above average intelligence Capable of independent living  Allergies:  Allergies  Allergen Reactions  . Vicodin [Hydrocodone-Acetaminophen]     PT. STATED IT MAKES HIM NAUSEATED .    Home Medications:  (Not in a hospital admission)  OB/GYN Status:  No LMP for male patient.  General Assessment Data Location of Assessment: [facility] ED Is this a Tele or Face-to-Face Assessment?: Face-to-Face Is this an Initial Assessment or a Re-assessment for this encounter?: Initial Assessment Living Arrangements: Alone Can pt return to current living arrangement?: Yes Admission Status: Voluntary Is patient capable of signing voluntary admission?: Yes Transfer from: Home Referral Source: Self/Family/Friend     [place] Living Arrangements: Alone Name of Psychiatrist: None reported Name of Therapist: None reported  Education Status Is patient currently in school?: No Current Grade: NA Highest grade of school patient has completed: Some college Name of school: NA Contact person: NA  Risk to self with the past 6 months Suicidal Ideation: No Suicidal  Intent: No Is patient at risk for suicide?: No Suicidal Plan?: No Access to Means: No What has been your use of drugs/alcohol within the last 12 months?: Daily alcohol use for the past month.  Previous Attempts/Gestures: No How many times?: 0 Other Self Harm Risks: No other self harm risk identified at this time. Triggers for Past Attempts: None known Intentional Self Injurious Behavior: None Family Suicide History: No Recent stressful life event(Hopkins): Other (Comment)  (Diagnosis of prostate cancer) Persecutory voices/beliefs?: No Depression: No Depression Symptoms: Tearfulness;Insomnia;Fatigue;Feeling worthless/self pity Substance abuse history and/or treatment for substance abuse?: Yes Suicide prevention information given to non-admitted patients: Not applicable  Risk to Others within the past 6 months Homicidal Ideation: No Thoughts of Harm to Others: No Current Homicidal Intent: No Current Homicidal Plan: No Access to Homicidal Means: No Identified Victim: NA History of harm to others?: No Assessment of Violence: None Noted Violent Behavior Description: No violent behaviors reported. Pt is calm and cooperative at this time.  Does patient have access to weapons?: No Criminal Charges Pending?: No Does patient have a court date: No  Psychosis Hallucinations: None noted Delusions: None noted  Mental Status Report Appear/Hygiene: Unable to Assess Eye Contact: Good Motor Activity: Freedom of movement Speech: Logical/coherent Level of Consciousness: Quiet/awake Mood: Pleasant;Depressed Affect: Appropriate to circumstance Anxiety Level: None Thought Processes: Coherent;Relevant Judgement: Unimpaired Orientation: Person;Place;Time;Situation Obsessive Compulsive Thoughts/Behaviors: None  Cognitive Functioning Concentration: Normal Memory: Remote Intact;Recent Intact IQ: Average Insight: Good Impulse Control: Fair Appetite: Poor Weight Loss: 0 Weight Gain: 0 Sleep: Decreased Total Hours of Sleep: 4 Vegetative Symptoms: None  ADLScreening [facility] Assessment Services) Patient'Hopkins cognitive ability adequate to safely complete daily activities?: Yes Patient able to express need for assistance with ADLs?: Yes Independently performs ADLs?: Yes (appropriate for developmental age)  Prior Inpatient Therapy Prior Inpatient Therapy: No  Prior Outpatient Therapy Prior Outpatient Therapy: No  ADL Screening (condition at time of  admission) Patient'Hopkins cognitive ability adequate to safely complete daily activities?: Yes Is the patient deaf or have difficulty hearing?: No Does the patient have difficulty seeing, even when wearing glasses/contacts?: No Does the patient have difficulty concentrating, remembering, or making decisions?: No Patient able to express need for assistance with ADLs?: Yes Does the patient have difficulty dressing or bathing?: No Independently performs ADLs?: Yes (appropriate for developmental age)       Abuse/Neglect Assessment (Assessment to be complete while patient is alone) Physical Abuse: Denies Verbal Abuse: Denies Sexual Abuse: Denies Exploitation of patient/patient'Hopkins resources: Denies Self-Neglect: Denies Values / Beliefs Cultural Requests During Hospitalization: None Spiritual Requests During Hospitalization: None   Advance Directives (For Healthcare) Does patient have an advance directive?: No Would patient like information on creating an advanced directive?: No - patient declined information    Additional Information 1:1 In Past 12 Months?: No CIRT Risk: No Elopement Risk: No     Disposition:  Disposition Initial Assessment Completed for this Encounter: Yes Disposition of Patient: Inpatient treatment program Type of inpatient treatment program: Adult  Ryan Hopkins 01/27/2014 1:28 AM

## 2014-01-28 NOTE — ED Provider Notes (Signed)
Medical screening examination/treatment/procedure(s) were performed by non-physician practitioner and as supervising physician I was immediately available for consultation/collaboration.   EKG Interpretation None       Kalman Drape, MD 01/28/14 [phone]

## 2014-01-30 ENCOUNTER — Telehealth: Payer: Self-pay | Admitting: Gastroenterology

## 2014-01-30 NOTE — Telephone Encounter (Signed)
Left message on machine to call back  

## 2014-01-30 NOTE — Telephone Encounter (Signed)
Moved to 02/08/14 1030 am Left message on machine to call back

## 2014-01-31 NOTE — Telephone Encounter (Signed)
The pt is aware of the change and was re instructed for 02/08/14

## 2014-02-04 ENCOUNTER — Encounter (HOSPITAL_COMMUNITY): Payer: Self-pay | Admitting: Emergency Medicine

## 2014-02-04 ENCOUNTER — Emergency Department (HOSPITAL_COMMUNITY)
Admission: EM | Admit: 2014-02-04 | Discharge: 2014-02-04 | Disposition: A | Discharge status: Home / Self Care | Payer: Medicare Other | Attending: Emergency Medicine | Admitting: Emergency Medicine

## 2014-02-04 DIAGNOSIS — G8929 Other chronic pain: Secondary | ICD-10-CM | POA: Diagnosis not present

## 2014-02-04 DIAGNOSIS — M545 Low back pain: Secondary | ICD-10-CM | POA: Diagnosis not present

## 2014-02-04 DIAGNOSIS — Z720 Tobacco use: Secondary | ICD-10-CM | POA: Diagnosis not present

## 2014-02-04 DIAGNOSIS — E119 Type 2 diabetes mellitus without complications: Secondary | ICD-10-CM | POA: Insufficient documentation

## 2014-02-04 DIAGNOSIS — Z79899 Other long term (current) drug therapy: Secondary | ICD-10-CM | POA: Insufficient documentation

## 2014-02-04 DIAGNOSIS — Z8719 Personal history of other diseases of the digestive system: Secondary | ICD-10-CM | POA: Diagnosis not present

## 2014-02-04 DIAGNOSIS — Z7951 Long term (current) use of inhaled steroids: Secondary | ICD-10-CM | POA: Diagnosis not present

## 2014-02-04 DIAGNOSIS — Z7982 Long term (current) use of aspirin: Secondary | ICD-10-CM | POA: Diagnosis not present

## 2014-02-04 DIAGNOSIS — Z8546 Personal history of malignant neoplasm of prostate: Secondary | ICD-10-CM | POA: Insufficient documentation

## 2014-02-04 DIAGNOSIS — J449 Chronic obstructive pulmonary disease, unspecified: Secondary | ICD-10-CM | POA: Insufficient documentation

## 2014-02-04 DIAGNOSIS — Z87438 Personal history of other diseases of male genital organs: Secondary | ICD-10-CM | POA: Insufficient documentation

## 2014-02-04 DIAGNOSIS — Z9889 Other specified postprocedural states: Secondary | ICD-10-CM | POA: Diagnosis not present

## 2014-02-04 DIAGNOSIS — I10 Essential (primary) hypertension: Secondary | ICD-10-CM | POA: Insufficient documentation

## 2014-02-04 DIAGNOSIS — M549 Dorsalgia, unspecified: Secondary | ICD-10-CM

## 2014-02-04 MED ORDER — OXYCODONE-ACETAMINOPHEN 7.5-325 MG PO TABS: 1.0000 | ORAL_TABLET | ORAL | Status: DC | PRN

## 2014-02-04 MED ORDER — OXYCODONE-ACETAMINOPHEN 5-325 MG PO TABS
1.0000 | ORAL_TABLET | Freq: Once | ORAL | Status: AC
  Administered 2014-02-04: 1 via ORAL
  Filled 2014-02-04: qty 1

## 2014-02-04 NOTE — ED Notes (Addendum)
C/o low back pain, also bilateral leg pain,R>L, also currently being treated for prostate CA, suppose to have surgery in 9d, pain onset 4d ago, h/o back surgery. Also mentions cold sx. (denies: urinary sx, nvd, fever, bleeding), rates 10/10, describes as burning and throbbing down both legs. Do not need any lab work done, just need pain under control. declined protocol labs.

## 2014-02-04 NOTE — ED Provider Notes (Signed)
CSN: [id number]     Arrival date & time 02/04/14  2142 History   First MD Initiated Contact with Patient 02/04/14 2224     Chief Complaint  Patient presents with  . Back Pain  . Leg Pain    HPI Pt has a history of chronic back pain.     The pain is in in his lower back and radiates to both legs, R>L.  His primary doctor had him on percocet and then switched him to fentanyl but he did not want to take that.  He also has a burning discomfort in his lower legs that he associates with his diabetes.  No numbness or weakness.  No weight loss or fevers.  No incontinence.  Past Medical History  Diagnosis Date  . Hypertension   . COPD (chronic obstructive pulmonary disease)   . Emphysema   . Enlarged prostate   . Degenerative arthritis of spine   . Back pain, chronic   . Diverticulitis   . Diabetes mellitus without complication   . Anal fissure   . Prostate cancer 11/2013   Past Surgical History  Procedure Laterality Date  . Lumbar disc surgery  2007    L4-L5  . Inguinal hernia repair Right years ago   Family History  Problem Relation Age of Onset  . Colon cancer Sister   . Prostate cancer Father   . Diabetes Mother   . Heart failure Mother   . Cancer Mother     cervical   History  Substance Use Topics  . Smoking status: Current Every Day Smoker -- 1.00 packs/day for 20 years    Types: Cigarettes  . Smokeless tobacco: Never Used  . Alcohol Use: Yes     Comment: 2-3 40's daily    Review of Systems  All other systems reviewed and are negative.     Allergies  Vicodin  Home Medications   Prior to Admission medications   Medication Sig Start Date End Date Taking? Authorizing Provider  albuterol (PROVENTIL HFA;VENTOLIN HFA) 108 (90 BASE) MCG/ACT inhaler Inhale 2 puffs into the lungs every 6 (six) hours as needed for wheezing or shortness of breath.    Historical Provider, MD  aspirin EC 81 MG tablet Take 81 mg by mouth every morning.     Historical Provider, MD   fluticasone-salmeterol (ADVAIR HFA) 115-21 MCG/ACT inhaler Inhale 2 puffs into the lungs 2 (two) times daily.    Historical Provider, MD  lisinopril-hydrochlorothiazide (PRINZIDE,ZESTORETIC) 20-12.5 MG per tablet Take 1 tablet by mouth every morning.     Historical Provider, MD  metFORMIN (GLUCOPHAGE) 500 MG tablet Take 500 mg by mouth daily with breakfast.    Historical Provider, MD  oxyCODONE-acetaminophen (PERCOCET) 7.5-325 MG per tablet Take 1 tablet by mouth every 4 (four) hours as needed for pain. 02/04/14   Dorie Rank, MD  zolpidem (AMBIEN) 5 MG tablet Take 5 mg by mouth at bedtime as needed for sleep.    Historical Provider, MD   BP 124/83  Pulse 94  Temp(Src) 97.8 F (36.6 C) (Oral)  Ht 5\' 8"  (1.727 m)  Wt 204 lb 9 oz (92.789 kg)  BMI 31.11 kg/m2  SpO2 98% Physical Exam  Nursing note and vitals reviewed. Constitutional: He appears well-developed and well-nourished. No distress.  HENT:  Head: Normocephalic and atraumatic.  Right Ear: External ear normal.  Left Ear: External ear normal.  Eyes: Conjunctivae are normal. Right eye exhibits no discharge. Left eye exhibits no discharge. No scleral icterus.  Neck: Neck supple. No tracheal deviation present.  Cardiovascular: Normal rate.   Pulmonary/Chest: Effort normal. No stridor. No respiratory distress.  Abdominal: Hernia confirmed negative in the left inguinal area.  Genitourinary: Right testis shows no mass. Left testis shows no mass.  Musculoskeletal: He exhibits no edema.       Lumbar back: He exhibits tenderness and bony tenderness. He exhibits no swelling and no edema.  Neurological: He is alert. Cranial nerve deficit: no gross deficits.  Skin: Skin is warm and dry. No rash noted.  Psychiatric: He has a normal mood and affect.    ED Course  Procedures (including critical care time)   MDM  I reviewed priior imaging studies.  Pt had a bone scan in July.  No sign of mets.  History of prostate CA but I do not think this  pain is related to that. Final diagnoses:  Chronic back pain   No neuro dysfunction.  Consistent with his chronic back problems.  Pt does not want an injection.  Just meds for pain.  Discussed with him importance of seeing his PCP regarding his pain medications.    Dorie Rank, MD 02/04/14 2251

## 2014-02-04 NOTE — Discharge Instructions (Signed)

## 2014-02-08 ENCOUNTER — Encounter (HOSPITAL_COMMUNITY): Payer: Self-pay | Admitting: Anesthesiology

## 2014-02-08 ENCOUNTER — Ambulatory Visit (HOSPITAL_COMMUNITY): Admission: RE | Admit: 2014-02-08 | Payer: Medicare Other | Source: Ambulatory Visit | Admitting: Gastroenterology

## 2014-02-08 ENCOUNTER — Telehealth: Payer: Self-pay | Admitting: Gastroenterology

## 2014-02-08 DIAGNOSIS — K6289 Other specified diseases of anus and rectum: Secondary | ICD-10-CM

## 2014-02-08 SURGERY — COLONOSCOPY WITH PROPOFOL
Anesthesia: Monitor Anesthesia Care

## 2014-02-08 NOTE — Anesthesia Preprocedure Evaluation (Deleted)
Anesthesia Evaluation  Patient identified by MRN, date of birth, ID band Patient awake    Reviewed: Allergy & Precautions, H&P , NPO status , Patient's Chart, lab work & pertinent test results  Airway Mallampati: II TM Distance: >3 FB Neck ROM: full    Dental no notable dental hx.    Pulmonary COPD COPD inhaler, Current Smoker,  breath sounds clear to auscultation  Pulmonary exam normal       Cardiovascular Exercise Tolerance: Good hypertension, Pt. on medications Rhythm:regular Rate:Normal     Neuro/Psych negative neurological ROS  negative psych ROS   GI/Hepatic negative GI ROS, Neg liver ROS,   Endo/Other  diabetes, Well Controlled, Type 2, Oral Hypoglycemic Agents  Renal/GU negative Renal ROS  negative genitourinary   Musculoskeletal   Abdominal   Peds  Hematology negative hematology ROS (+)   Anesthesia Other Findings   Reproductive/Obstetrics negative OB ROS                           Anesthesia Physical Anesthesia Plan  ASA: III  Anesthesia Plan: MAC   Post-op Pain Management:    Induction:   Airway Management Planned:   Additional Equipment:   Intra-op Plan:   Post-operative Plan:   Informed Consent: I have reviewed the patients History and Physical, chart, labs and discussed the procedure including the risks, benefits and alternatives for the proposed anesthesia with the patient or authorized representative who has indicated his/her understanding and acceptance.   Dental Advisory Given  Plan Discussed with: CRNA and Surgeon  Anesthesia Plan Comments:         Anesthesia Quick Evaluation

## 2014-02-08 NOTE — Telephone Encounter (Signed)
He did not show for his colonoscopy today. Please call him and ask him to reschedule. Also please communicate his no show to his PCP.  thanks

## 2014-02-09 ENCOUNTER — Other Ambulatory Visit: Payer: Self-pay

## 2014-02-09 ENCOUNTER — Other Ambulatory Visit: Payer: Self-pay | Admitting: Urology

## 2014-02-09 ENCOUNTER — Other Ambulatory Visit (HOSPITAL_COMMUNITY): Payer: Self-pay | Admitting: Anesthesiology

## 2014-02-09 ENCOUNTER — Other Ambulatory Visit (HOSPITAL_COMMUNITY): Payer: Self-pay | Admitting: *Deleted

## 2014-02-09 NOTE — Progress Notes (Signed)
ekg 11-26-13 epic

## 2014-02-09 NOTE — Patient Instructions (Addendum)
Ryan Hopkins  02/09/2014   Your procedure is scheduled on: Wednesday 02/14/14  Report to [place] and follow signs to  [place] at 630 AM.  Call this number if you have problems the morning of surgery [phone]   Remember: please bring inhaler on day of surgery   Do not eat food or drink liquids :After Midnight.    Take these medicines the morning of surgery with A SIP OF WATER: inhaler if needed                               You may not have any metal on your body including hair pins and piercings  Do not wear jewelry, make-up, lotions, powders, or deodorant.   Men may shave face and neck.  Do not bring valuables to the hospital. [place].  Contacts, dentures or bridgework may not be worn into surgery.  Leave suitcase in the car. After surgery it may be brought to your room.  For patients admitted to the hospital, checkout time is 11:00 AM the day of discharge. ________________________________________________________________________  [facility] - Preparing for Surgery Before surgery, you can play an important role.  Because skin is not sterile, your skin needs to be as free of germs as possible.  You can reduce the number of germs on your skin by washing with CHG (chlorahexidine gluconate) soap before surgery.  CHG is an antiseptic cleaner which kills germs and bonds with the skin to continue killing germs even after washing. Please DO NOT use if you have an allergy to CHG or antibacterial soaps.  If your skin becomes reddened/irritated stop using the CHG and inform your nurse when you arrive at Short Stay. Do not shave (including legs and underarms) for at least 48 hours prior to the first CHG shower.  You may shave your face/neck. Please follow these instructions carefully:  1.  Shower with CHG Soap the night before surgery and the  morning of Surgery.  2.  If you choose to wash your hair, wash your hair  first as usual with your  normal  shampoo.  3.  After you shampoo, rinse your hair and body thoroughly to remove the  shampoo.                            4.  Use CHG as you would any other liquid soap.  You can apply chg directly  to the skin and wash                       Gently with a scrungie or clean washcloth.  5.  Apply the CHG Soap to your body ONLY FROM THE NECK DOWN.   Do not use on face/ open                           Wound or open sores. Avoid contact with eyes, ears mouth and genitals (private parts).                       Wash face,  Genitals (private parts) with your normal soap.             6.  Wash thoroughly, paying special attention to the area where your surgery  will  be performed.  7.  Thoroughly rinse your body with warm water from the neck down.  8.  DO NOT shower/wash with your normal soap after using and rinsing off  the CHG Soap.                9.  Pat yourself dry with a clean towel.            10.  Wear clean pajamas.            11.  Place clean sheets on your bed the night of your first shower and do not  sleep with pets. Day of Surgery : Do not apply any lotions/deodorants the morning of surgery.  Please wear clean clothes to the hospital/surgery center.  FAILURE TO FOLLOW THESE INSTRUCTIONS MAY RESULT IN THE CANCELLATION OF YOUR SURGERY PATIENT SIGNATURE_________________________________  NURSE SIGNATURE__________________________________  ________________________________________________________________________  WHAT IS A BLOOD TRANSFUSION? Blood Transfusion Information  A transfusion is the replacement of blood or some of its parts. Blood is made up of multiple cells which provide different functions.  Red blood cells carry oxygen and are used for blood loss replacement.  White blood cells fight against infection.  Platelets control bleeding.  Plasma helps clot blood.  Other blood products are available for specialized needs, such as hemophilia or other  clotting disorders. BEFORE THE TRANSFUSION  Who gives blood for transfusions?   Healthy volunteers who are fully evaluated to make sure their blood is safe. This is blood bank blood. Transfusion therapy is the safest it has ever been in the practice of medicine. Before blood is taken from a donor, a complete history is taken to make sure that person has no history of diseases nor engages in risky social behavior (examples are intravenous drug use or sexual activity with multiple partners). The donor's travel history is screened to minimize risk of transmitting infections, such as malaria. The donated blood is tested for signs of infectious diseases, such as HIV and hepatitis. The blood is then tested to be sure it is compatible with you in order to minimize the chance of a transfusion reaction. If you or a relative donates blood, this is often done in anticipation of surgery and is not appropriate for emergency situations. It takes many days to process the donated blood. RISKS AND COMPLICATIONS Although transfusion therapy is very safe and saves many lives, the main dangers of transfusion include:   Getting an infectious disease.  Developing a transfusion reaction. This is an allergic reaction to something in the blood you were given. Every precaution is taken to prevent this. The decision to have a blood transfusion has been considered carefully by your caregiver before blood is given. Blood is not given unless the benefits outweigh the risks. AFTER THE TRANSFUSION  Right after receiving a blood transfusion, you will usually feel much better and more energetic. This is especially true if your red blood cells have gotten low (anemic). The transfusion raises the level of the red blood cells which carry oxygen, and this usually causes an energy increase.  The nurse administering the transfusion will monitor you carefully for complications. HOME CARE INSTRUCTIONS  No special instructions are needed  after a transfusion. You may find your energy is better. Speak with your caregiver about any limitations on activity for underlying diseases you may have. SEEK MEDICAL CARE IF:   Your condition is not improving after your transfusion.  You develop redness or irritation at the intravenous (IV) site. [place]  CARE IF:  Any of the following symptoms occur over the next 12 hours:  Shaking chills.  You have a temperature by mouth above 102 F (38.9 C), not controlled by medicine.  Chest, back, or muscle pain.  People around you feel you are not acting correctly or are confused.  Shortness of breath or difficulty breathing.  Dizziness and fainting.  You get a rash or develop hives.  You have a decrease in urine output.  Your urine turns a dark color or changes to pink, red, or brown. Any of the following symptoms occur over the next 10 days:  You have a temperature by mouth above 102 F (38.9 C), not controlled by medicine.  Shortness of breath.  Weakness after normal activity.  The white part of the eye turns yellow (jaundice).  You have a decrease in the amount of urine or are urinating less often.  Your urine turns a dark color or changes to pink, red, or brown. Document Released: 04/17/2000 Document Revised: 07/13/2011 Document Reviewed: 12/05/2007 [facility] Patient Information 2014 [place], [place].  _______________________________________________________________________

## 2014-02-09 NOTE — Progress Notes (Signed)
Darrel Reach, scheduler for [facility] called to inform nurse that Dr. Louis Meckel wants Anesthesia to see patient day of pre-op appointment.

## 2014-02-12 ENCOUNTER — Inpatient Hospital Stay (HOSPITAL_COMMUNITY)
Admission: RE | Admit: 2014-02-12 | Discharge: 2014-02-12 | Disposition: A | Discharge status: Home / Self Care | Payer: Medicare Other | Source: Ambulatory Visit

## 2014-02-12 ENCOUNTER — Other Ambulatory Visit: Payer: Self-pay

## 2014-02-12 DIAGNOSIS — K6289 Other specified diseases of anus and rectum: Secondary | ICD-10-CM

## 2014-02-12 NOTE — Telephone Encounter (Signed)
Left message on machine to call back  

## 2014-02-12 NOTE — Telephone Encounter (Signed)
The procedure has been moved to 02/22/14 the pt is aware and re instructed.

## 2014-02-15 ENCOUNTER — Encounter (HOSPITAL_COMMUNITY): Payer: Self-pay | Admitting: Emergency Medicine

## 2014-02-15 ENCOUNTER — Emergency Department (HOSPITAL_COMMUNITY)
Admission: EM | Admit: 2014-02-15 | Discharge: 2014-02-15 | Disposition: A | Discharge status: Home / Self Care | Payer: Medicare Other | Attending: Emergency Medicine | Admitting: Emergency Medicine

## 2014-02-15 DIAGNOSIS — M479 Spondylosis, unspecified: Secondary | ICD-10-CM | POA: Diagnosis not present

## 2014-02-15 DIAGNOSIS — Z8546 Personal history of malignant neoplasm of prostate: Secondary | ICD-10-CM | POA: Insufficient documentation

## 2014-02-15 DIAGNOSIS — Z87891 Personal history of nicotine dependence: Secondary | ICD-10-CM | POA: Insufficient documentation

## 2014-02-15 DIAGNOSIS — Z79899 Other long term (current) drug therapy: Secondary | ICD-10-CM | POA: Insufficient documentation

## 2014-02-15 DIAGNOSIS — Z7982 Long term (current) use of aspirin: Secondary | ICD-10-CM | POA: Diagnosis not present

## 2014-02-15 DIAGNOSIS — M5430 Sciatica, unspecified side: Secondary | ICD-10-CM | POA: Insufficient documentation

## 2014-02-15 DIAGNOSIS — Z8719 Personal history of other diseases of the digestive system: Secondary | ICD-10-CM | POA: Diagnosis not present

## 2014-02-15 DIAGNOSIS — M5442 Lumbago with sciatica, left side: Secondary | ICD-10-CM

## 2014-02-15 DIAGNOSIS — M545 Low back pain: Secondary | ICD-10-CM | POA: Diagnosis present

## 2014-02-15 DIAGNOSIS — Z87448 Personal history of other diseases of urinary system: Secondary | ICD-10-CM | POA: Insufficient documentation

## 2014-02-15 DIAGNOSIS — J439 Emphysema, unspecified: Secondary | ICD-10-CM | POA: Insufficient documentation

## 2014-02-15 DIAGNOSIS — E119 Type 2 diabetes mellitus without complications: Secondary | ICD-10-CM | POA: Insufficient documentation

## 2014-02-15 DIAGNOSIS — M5441 Lumbago with sciatica, right side: Secondary | ICD-10-CM

## 2014-02-15 DIAGNOSIS — G8929 Other chronic pain: Secondary | ICD-10-CM | POA: Diagnosis not present

## 2014-02-15 DIAGNOSIS — M79669 Pain in unspecified lower leg: Secondary | ICD-10-CM | POA: Insufficient documentation

## 2014-02-15 DIAGNOSIS — I10 Essential (primary) hypertension: Secondary | ICD-10-CM | POA: Diagnosis not present

## 2014-02-15 MED ORDER — OXYCODONE HCL 5 MG PO TABS
10.0000 mg | ORAL_TABLET | Freq: Once | ORAL | Status: AC
  Administered 2014-02-15: 10 mg via ORAL
  Filled 2014-02-15: qty 2

## 2014-02-15 MED ORDER — OXYCODONE HCL 5 MG PO TABS: 5.0000 mg | ORAL_TABLET | Freq: Four times a day (QID) | ORAL | Status: DC | PRN

## 2014-02-15 NOTE — ED Provider Notes (Signed)
CSN: [id number]     Arrival date & time 02/15/14  1215 History  This chart was scribed for non-physician practitioner Cleatrice Burke, working with Wandra Arthurs, MD by Donato Schultz, ED Scribe. This patient was seen in room TR08C/TR08C and the patient's care was started at 2:06 PM.    Chief Complaint  Patient presents with  . Back Pain  . Leg Pain    Patient is a 63 y.o. male presenting with back pain and leg pain. The history is provided by the patient. No language interpreter was used.  Back Pain Associated symptoms: leg pain   Associated symptoms: no chest pain and no fever   Leg Pain Associated symptoms: back pain   Associated symptoms: no fever    HPI Comments: Ryan Hopkins is a 63 y.o. male with a history of prostate cancer and chronic back and bilateral leg pain who presents to the Emergency Department complaining of constant, burning, aching lower back pain radiating throughout bis legs bilaterally that started at 2 AM this morning.  Walking aggravates his pain.  He normally takes 7.5mg  of Percocet but finished the 60 tablets he was given 8 days ago.  He denies fever, chest pain, trouble breathing, and bowel and bladder incontinence as associated symptoms.  He has an appointment with his PCP Tuesday of next week.  He had a bone scan in July which showed no signs of metastasis.     Past Medical History  Diagnosis Date  . Hypertension   . COPD (chronic obstructive pulmonary disease)   . Emphysema   . Enlarged prostate   . Degenerative arthritis of spine   . Back pain, chronic   . Diverticulitis   . Diabetes mellitus without complication   . Anal fissure   . Prostate cancer 11/2013   Past Surgical History  Procedure Laterality Date  . Lumbar disc surgery  2007    L4-L5  . Inguinal hernia repair Right years ago   Family History  Problem Relation Age of Onset  . Colon cancer Sister   . Prostate cancer Father   . Diabetes Mother   . Heart failure Mother   . Cancer  Mother     cervical   History  Substance Use Topics  . Smoking status: Former Smoker -- 1.00 packs/day for 20 years    Quit date: 01/16/2014  . Smokeless tobacco: Never Used  . Alcohol Use: Yes     Comment: 2-3 40's daily    Review of Systems  Constitutional: Negative for fever.  Respiratory: Negative for shortness of breath.   Cardiovascular: Negative for chest pain.  Musculoskeletal: Positive for back pain.  All other systems reviewed and are negative.     Allergies  Vicodin  Home Medications   Prior to Admission medications   Medication Sig Start Date End Date Taking? Authorizing Provider  albuterol (PROVENTIL HFA;VENTOLIN HFA) 108 (90 BASE) MCG/ACT inhaler Inhale 2 puffs into the lungs every 6 (six) hours as needed for wheezing or shortness of breath.    Historical Provider, MD  aspirin EC 81 MG tablet Take 81 mg by mouth every morning.     Historical Provider, MD  fluticasone-salmeterol (ADVAIR HFA) 115-21 MCG/ACT inhaler Inhale 2 puffs into the lungs 2 (two) times daily.    Historical Provider, MD  lisinopril-hydrochlorothiazide (PRINZIDE,ZESTORETIC) 20-12.5 MG per tablet Take 1 tablet by mouth every morning.     Historical Provider, MD  metFORMIN (GLUCOPHAGE) 500 MG tablet Take 500 mg by mouth daily with  breakfast.    Historical Provider, MD  oxyCODONE-acetaminophen (PERCOCET) 7.5-325 MG per tablet Take 1 tablet by mouth every 4 (four) hours as needed for pain. 02/04/14   Dorie Rank, MD  zolpidem (AMBIEN) 5 MG tablet Take 5 mg by mouth at bedtime as needed for sleep.    Historical Provider, MD   BP 143/98  Pulse 89  Temp(Src) 98.5 F (36.9 C) (Oral)  Resp 14  SpO2 96% Physical Exam  Nursing note and vitals reviewed. Constitutional: He is oriented to person, place, and time. He appears well-developed and well-nourished. No distress.  HENT:  Head: Normocephalic and atraumatic.  Right Ear: External ear normal.  Left Ear: External ear normal.  Nose: Nose normal.   Eyes: Conjunctivae and EOM are normal.  Neck: Normal range of motion. No tracheal deviation present.  Cardiovascular: Normal rate, regular rhythm and normal heart sounds.   Pulmonary/Chest: Effort normal and breath sounds normal. No stridor.  Abdominal: Soft. He exhibits no distension. There is no tenderness.  Musculoskeletal: Normal range of motion. He exhibits tenderness.  Tender to palpation diffusely to back.  No deformities or step offs.  Neurological: He is alert and oriented to person, place, and time. He has normal reflexes.  Sensation intact.  Strength 5/5 to lower extremities bilaterally.  Skin: Skin is warm and dry. No lesion and no rash noted. He is not diaphoretic.  Psychiatric: He has a normal mood and affect. His behavior is normal.    ED Course  Procedures (including critical care time)  DIAGNOSTIC STUDIES: Oxygen Saturation is 96% on room air, adequate by my interpretation.    COORDINATION OF CARE: 2:10 PM- Discussed administering 7.5mg  Percocet in the ED.  The patient agreed to the treatment plan.  Labs Review Labs Reviewed - No data to display  Imaging Review No results found.   EKG Interpretation None      MDM   Final diagnoses:  Bilateral low back pain with sciatica, sciatica laterality unspecified    Patient with chronic back pain.  No neurological deficits and normal neuro exam.  Patient can walk but states is painful.  No loss of bowel or bladder control.  No concern for cauda equina.  No fever, night sweats, weight loss, h/o cancer, IVDU.  Drug database was reviewed and it appears patient has received a significant amount of oxycodone IR and percocet tablets in the past month. Discussed with patient I could only give him an rx for 6 tabs oxycodone. Patient was unhappy with this. Patient needs to follow up with PCP. He was given medication in ED. Discussed reasons to return to ED immediately. Vital signs stable for discharge. Patient / Family /  Caregiver informed of clinical course, understand medical decision-making process, and agree with plan.   I personally performed the services described in this documentation, which was scribed in my presence. The recorded information has been reviewed and is accurate.    Elwyn Lade, PA-C 02/20/14 [phone]

## 2014-02-15 NOTE — ED Notes (Signed)
Patient complains of chronic back and chronic bilateral leg pain.   Patient states he ran out of pain medication.   Patient requesting Percocet  7.5mg .

## 2014-02-15 NOTE — Discharge Instructions (Signed)
Back Pain, Adult °Back pain is very common. The pain often gets better over time. The cause of back pain is usually not dangerous. Most people can learn to manage their back pain on their own.  °HOME CARE  °· Stay active. Start with short walks on flat ground if you can. Try to walk farther each day. °· Do not sit, drive, or stand in one place for more than 30 minutes. Do not stay in bed. °· Do not avoid exercise or work. Activity can help your back heal faster. °· Be careful when you bend or lift an object. Bend at your knees, keep the object close to you, and do not twist. °· Sleep on a firm mattress. Lie on your side, and bend your knees. If you lie on your back, put a pillow under your knees. °· Only take medicines as told by your doctor. °· Put ice on the injured area. °¨ Put ice in a plastic bag. °¨ Place a towel between your skin and the bag. °¨ Leave the ice on for 15-20 minutes, 03-04 times a day for the first 2 to 3 days. After that, you can switch between ice and heat packs. °· Ask your doctor about back exercises or massage. °· Avoid feeling anxious or stressed. Find good ways to deal with stress, such as exercise. °GET HELP RIGHT AWAY IF:  °· Your pain does not go away with rest or medicine. °· Your pain does not go away in 1 week. °· You have new problems. °· You do not feel well. °· The pain spreads into your legs. °· You cannot control when you poop (bowel movement) or pee (urinate). °· Your arms or legs feel weak or lose feeling (numbness). °· You feel sick to your stomach (nauseous) or throw up (vomit). °· You have belly (abdominal) pain. °· You feel like you may pass out (faint). °MAKE SURE YOU:  °· Understand these instructions. °· Will watch your condition. °· Will get help right away if you are not doing well or get worse. °Document Released: 10/07/2007 Document Revised: 07/13/2011 Document Reviewed: 08/22/2013 °[employer]® Patient Information ©2015 [employer]. This information is not intended  to replace advice given to you by your health care provider. Make sure you discuss any questions you have with your health care provider. ° °

## 2014-02-16 ENCOUNTER — Encounter (HOSPITAL_COMMUNITY): Payer: Self-pay | Admitting: Pharmacy Technician

## 2014-02-16 ENCOUNTER — Encounter (HOSPITAL_COMMUNITY): Payer: Self-pay | Admitting: *Deleted

## 2014-02-20 NOTE — ED Provider Notes (Signed)
Medical screening examination/treatment/procedure(s) were performed by non-physician practitioner and as supervising physician I was immediately available for consultation/collaboration.   EKG Interpretation None        Wandra Arthurs, MD 02/20/14 [phone]

## 2014-02-22 ENCOUNTER — Ambulatory Visit: Payer: Medicare Other | Admitting: Podiatrist

## 2014-02-22 ENCOUNTER — Ambulatory Visit (HOSPITAL_COMMUNITY): Admission: RE | Admit: 2014-02-22 | Payer: Medicare Other | Source: Ambulatory Visit | Admitting: Gastroenterology

## 2014-02-22 SURGERY — COLONOSCOPY WITH PROPOFOL
Anesthesia: Monitor Anesthesia Care

## 2014-03-14 ENCOUNTER — Ambulatory Visit (HOSPITAL_COMMUNITY)
Admission: RE | Admit: 2014-03-14 | Discharge: 2014-03-14 | Disposition: A | Discharge status: Home / Self Care | Payer: Medicare Other | Source: Ambulatory Visit | Attending: Anesthesiology | Admitting: Anesthesiology

## 2014-03-14 ENCOUNTER — Ambulatory Visit: Payer: Medicare Other | Admitting: Podiatrist

## 2014-03-14 ENCOUNTER — Encounter (HOSPITAL_COMMUNITY)
Admission: RE | Admit: 2014-03-14 | Discharge: 2014-03-14 | Disposition: A | Discharge status: Home / Self Care | Payer: Medicare Other | Source: Ambulatory Visit | Attending: Urology | Admitting: Urology

## 2014-03-14 ENCOUNTER — Other Ambulatory Visit (HOSPITAL_COMMUNITY): Payer: Medicare Other

## 2014-03-14 ENCOUNTER — Encounter (HOSPITAL_COMMUNITY): Payer: Self-pay

## 2014-03-14 DIAGNOSIS — I10 Essential (primary) hypertension: Secondary | ICD-10-CM

## 2014-03-14 HISTORY — DX: Cardiac murmur, unspecified: R01.1

## 2014-03-14 HISTORY — DX: Nocturia: R35.1

## 2014-03-14 LAB — BASIC METABOLIC PANEL
Anion gap: 15 (ref 5–15)
BUN: 16 mg/dL (ref 6–23)
CALCIUM: 9.6 mg/dL (ref 8.4–10.5)
CO2: 23 meq/L (ref 19–32)
CREATININE: 1.28 mg/dL (ref 0.50–1.35)
Chloride: 101 mEq/L (ref 96–112)
GFR calc Af Amer: 66 mL/min — ABNORMAL LOW (ref >90)
GFR calc non Af Amer: 57 mL/min — ABNORMAL LOW (ref >90)
Glucose, Bld: 91 mg/dL (ref 70–99)
Potassium: 3.5 mEq/L — ABNORMAL LOW (ref 3.7–5.3)
Sodium: 139 mEq/L (ref 137–147)

## 2014-03-14 LAB — CBC
HEMATOCRIT: 46.1 % (ref 39.0–52.0)
Hemoglobin: 15.7 g/dL (ref 13.0–17.0)
MCH: 31 pg (ref 26.0–34.0)
MCHC: 34.1 g/dL (ref 30.0–36.0)
MCV: 90.9 fL (ref 78.0–100.0)
Platelets: 238 10*3/uL (ref 150–400)
RBC: 5.07 MIL/uL (ref 4.22–5.81)
RDW: 14.4 % (ref 11.5–15.5)
WBC: 8.9 10*3/uL (ref 4.0–10.5)

## 2014-03-14 LAB — ABO/RH: ABO/RH(D): B POS

## 2014-03-14 NOTE — Anesthesia Preprocedure Evaluation (Addendum)
Anesthesia Evaluation  Patient identified by MRN, date of birth, ID band Patient awake    Reviewed: Allergy & Precautions, H&P , NPO status , Patient's Chart, lab work & pertinent test results  History of Anesthesia Complications Negative for: history of anesthetic complications  Airway Mallampati: II  TM Distance: >3 FB Neck ROM: full    Dental no notable dental hx. (+) Edentulous Upper, Dental Advisory Given, Missing Lower partial:   Pulmonary COPD COPD inhaler, Current Smoker, former smoker,  Emphysema - moderate breath sounds clear to auscultation  Pulmonary exam normal       Cardiovascular Exercise Tolerance: Good hypertension, Pt. on medications Rhythm:regular Rate:Normal     Neuro/Psych negative neurological ROS  negative psych ROS   GI/Hepatic negative GI ROS, Neg liver ROS,   Endo/Other  diabetes, Well Controlled, Type 2, Oral Hypoglycemic Agents  Renal/GU negative Renal ROS  negative genitourinary   Musculoskeletal  (+) Arthritis -, Osteoarthritis,    Abdominal   Peds negative pediatric ROS (+)  Hematology negative hematology ROS (+)   Anesthesia Other Findings   Reproductive/Obstetrics negative OB ROS                           Anesthesia Physical Anesthesia Plan  ASA: III  Anesthesia Plan: General   Post-op Pain Management:    Induction: Intravenous  Airway Management Planned: Oral ETT  Additional Equipment: Arterial line  Intra-op Plan:   Post-operative Plan: Extubation in OR and Possible Post-op intubation/ventilation  Informed Consent:   Plan Discussed with: Surgeon  Anesthesia Plan Comments:        Anesthesia Quick Evaluation

## 2014-03-14 NOTE — Progress Notes (Signed)
Dr. Landry Dyke spoke with pt at PST visit

## 2014-03-14 NOTE — Progress Notes (Signed)
   03/14/14 1444  OBSTRUCTIVE SLEEP APNEA  Have you ever been diagnosed with sleep apnea through a sleep study? No  Do you snore loudly (loud enough to be heard through closed doors)?  0  Do you often feel tired, fatigued, or sleepy during the daytime? 0  Has anyone observed you stop breathing during your sleep? 0  Do you have, or are you being treated for high blood pressure? 1  BMI more than 35 kg/m2? 0  Age over 65 years old? 1  Neck circumference greater than 40 cm/16 inches? 1  Gender: 1  Obstructive Sleep Apnea Score 4  Score 4 or greater  Results sent to PCP

## 2014-03-14 NOTE — Patient Instructions (Addendum)
Ryan Hopkins  03/14/2014                           YOUR PROCEDURE IS SCHEDULED ON:  03/16/14                ENTER FROM [address] - GO TO [address]ARKING DECK               LOOK FOR VALET [facility]ARKING  / [employer]                              FOLLOW  SIGNS TO SHORT STAY CENTER                 ARRIVE AT SHORT STAY AT: 9:45 AM               CALL THIS NUMBER IF ANY PROBLEMS THE DAY OF SURGERY :               [phone]                                REMEMBER:   Do not eat food or drink liquids AFTER MIDNIGHT                  Take these medicines the morning of surgery with               A SIPS OF WATER :  USE ADVAIR INHALER / MAY TAKE XANAX OR OXYCODONE IF NEEDED / BRING ALBUTEROL [place]        Do not wear jewelry, make-up   Do not wear lotions, powders, or perfumes.   Do not shave legs or underarms 12 hrs. before surgery (men may shave face)  Do not bring valuables to the hospital.  Contacts, dentures or bridgework may not be worn into surgery.  Leave suitcase in the car. After surgery it may be brought to your room.  For patients admitted to the hospital more than one night, checkout time is            11:00 AM                                                         ________________________________________________________________________                                                                                                  [place]  Before surgery, you can play an important role.  Because skin is not sterile, your skin needs to be as free of germs as possible.  You can reduce the number of germs on your skin by washing with CHG (chlorahexidine gluconate) soap before surgery.  CHG is an antiseptic cleaner which kills germs  and bonds with the skin to continue killing germs even after washing. Please DO NOT use if you have an allergy to CHG or antibacterial soaps.  If your skin becomes reddened/irritated stop using the CHG  and inform your nurse when you arrive at Short Stay. Do not shave (including legs and underarms) for at least 48 hours prior to the first CHG shower.  You may shave your face. Please follow these instructions carefully:   1.  Shower with CHG Soap the night before surgery and the  morning of Surgery.   2.  If you choose to wash your hair, wash your hair first as usual with your  normal  Shampoo.   3.  After you shampoo, rinse your hair and body thoroughly to remove the  shampoo.                                         4.  Use CHG as you would any other liquid soap.  You can apply chg directly  to the skin and wash . Gently wash with scrungie or clean wascloth    5.  Apply the CHG Soap to your body ONLY FROM THE NECK DOWN.   Do not use on open                           Wound or open sores. Avoid contact with eyes, ears mouth and genitals (private parts).                        Genitals (private parts) with your normal soap.              6.  Wash thoroughly, paying special attention to the area where your surgery  will be performed.   7.  Thoroughly rinse your body with warm water from the neck down.   8.  DO NOT shower/wash with your normal soap after using and rinsing off  the CHG Soap .                9.  Pat yourself dry with a clean towel.             10.  Wear clean pajamas.             11.  Place clean sheets on your bed the night of your first shower and do not  sleep with pets.  Day of Surgery : Do not apply any lotions/deodorants the morning of surgery.  Please wear clean clothes to the hospital/surgery center.  FAILURE TO FOLLOW THESE INSTRUCTIONS MAY RESULT IN THE CANCELLATION OF YOUR SURGERY    PATIENT SIGNATURE_________________________________  ______________________________________________________________________    WHAT IS A BLOOD TRANSFUSION? Blood Transfusion Information  A transfusion is the replacement of blood or some of its parts. Blood is made up of  multiple cells which provide different functions.  Red blood cells carry oxygen and are used for blood loss replacement.  White blood cells fight against infection.  Platelets control bleeding.  Plasma helps clot blood.  Other blood products are available for specialized needs, such as hemophilia or other clotting disorders. BEFORE THE TRANSFUSION  Who gives blood for transfusions?   Healthy volunteers who are fully evaluated to make sure their blood is safe. This is blood bank blood. Transfusion therapy is the safest it  has ever been in the practice of medicine. Before blood is taken from a donor, a complete history is taken to make sure that person has no history of diseases nor engages in risky social behavior (examples are intravenous drug use or sexual activity with multiple partners). The donor's travel history is screened to minimize risk of transmitting infections, such as malaria. The donated blood is tested for signs of infectious diseases, such as HIV and hepatitis. The blood is then tested to be sure it is compatible with you in order to minimize the chance of a transfusion reaction. If you or a relative donates blood, this is often done in anticipation of surgery and is not appropriate for emergency situations. It takes many days to process the donated blood. RISKS AND COMPLICATIONS Although transfusion therapy is very safe and saves many lives, the main dangers of transfusion include:   Getting an infectious disease.  Developing a transfusion reaction. This is an allergic reaction to something in the blood you were given. Every precaution is taken to prevent this. The decision to have a blood transfusion has been considered carefully by your caregiver before blood is given. Blood is not given unless the benefits outweigh the risks. AFTER THE TRANSFUSION  Right after receiving a blood transfusion, you will usually feel much better and more energetic. This is especially true if  your red blood cells have gotten low (anemic). The transfusion raises the level of the red blood cells which carry oxygen, and this usually causes an energy increase.  The nurse administering the transfusion will monitor you carefully for complications. HOME CARE INSTRUCTIONS  No special instructions are needed after a transfusion. You may find your energy is better. Speak with your caregiver about any limitations on activity for underlying diseases you may have. SEEK MEDICAL CARE IF:   Your condition is not improving after your transfusion.  You develop redness or irritation at the intravenous (IV) site. SEEK IMMEDIATE MEDICAL CARE IF:  Any of the following symptoms occur over the next 12 hours:  Shaking chills.  You have a temperature by mouth above 102 F (38.9 C), not controlled by medicine.  Chest, back, or muscle pain.  People around you feel you are not acting correctly or are confused.  Shortness of breath or difficulty breathing.  Dizziness and fainting.  You get a rash or develop hives.  You have a decrease in urine output.  Your urine turns a dark color or changes to pink, red, or brown. Any of the following symptoms occur over the next 10 days:  You have a temperature by mouth above 102 F (38.9 C), not controlled by medicine.  Shortness of breath.  Weakness after normal activity.  The white part of the eye turns yellow (jaundice).  You have a decrease in the amount of urine or are urinating less often.  Your urine turns a dark color or changes to pink, red, or brown. Document Released: 04/17/2000 Document Revised: 07/13/2011 Document Reviewed: 12/05/2007 [facility] Patient Information 2014 [place], [place].  _______________________________________________________________________

## 2014-03-15 LAB — URINE CULTURE
CULTURE: NO GROWTH
Colony Count: NO GROWTH

## 2014-03-16 ENCOUNTER — Inpatient Hospital Stay (HOSPITAL_COMMUNITY): Payer: Medicare Other | Admitting: Anesthesiology

## 2014-03-16 ENCOUNTER — Inpatient Hospital Stay (HOSPITAL_COMMUNITY)
Admission: RE | Admit: 2014-03-16 | Discharge: 2014-03-19 | DRG: 708 | Disposition: A | Discharge status: Home / Self Care | Payer: Medicare Other | Source: Ambulatory Visit | Attending: Urology | Admitting: Urology

## 2014-03-16 ENCOUNTER — Encounter (HOSPITAL_COMMUNITY): Payer: Self-pay | Admitting: *Deleted

## 2014-03-16 ENCOUNTER — Encounter (HOSPITAL_COMMUNITY)
Admission: RE | Disposition: A | Discharge status: Home / Self Care | Payer: Self-pay | Source: Ambulatory Visit | Attending: Urology

## 2014-03-16 DIAGNOSIS — Z79899 Other long term (current) drug therapy: Secondary | ICD-10-CM

## 2014-03-16 DIAGNOSIS — Z01818 Encounter for other preprocedural examination: Secondary | ICD-10-CM

## 2014-03-16 DIAGNOSIS — Z87891 Personal history of nicotine dependence: Secondary | ICD-10-CM

## 2014-03-16 DIAGNOSIS — E119 Type 2 diabetes mellitus without complications: Secondary | ICD-10-CM | POA: Diagnosis present

## 2014-03-16 DIAGNOSIS — R112 Nausea with vomiting, unspecified: Secondary | ICD-10-CM | POA: Diagnosis not present

## 2014-03-16 DIAGNOSIS — C61 Malignant neoplasm of prostate: Secondary | ICD-10-CM | POA: Diagnosis present

## 2014-03-16 DIAGNOSIS — Z885 Allergy status to narcotic agent status: Secondary | ICD-10-CM | POA: Diagnosis not present

## 2014-03-16 DIAGNOSIS — E785 Hyperlipidemia, unspecified: Secondary | ICD-10-CM | POA: Diagnosis present

## 2014-03-16 DIAGNOSIS — M1990 Unspecified osteoarthritis, unspecified site: Secondary | ICD-10-CM | POA: Diagnosis present

## 2014-03-16 DIAGNOSIS — Z01812 Encounter for preprocedural laboratory examination: Secondary | ICD-10-CM

## 2014-03-16 DIAGNOSIS — K6289 Other specified diseases of anus and rectum: Secondary | ICD-10-CM | POA: Diagnosis present

## 2014-03-16 DIAGNOSIS — Z8042 Family history of malignant neoplasm of prostate: Secondary | ICD-10-CM

## 2014-03-16 DIAGNOSIS — Z791 Long term (current) use of non-steroidal anti-inflammatories (NSAID): Secondary | ICD-10-CM

## 2014-03-16 DIAGNOSIS — J449 Chronic obstructive pulmonary disease, unspecified: Secondary | ICD-10-CM | POA: Diagnosis present

## 2014-03-16 DIAGNOSIS — N529 Male erectile dysfunction, unspecified: Secondary | ICD-10-CM | POA: Diagnosis present

## 2014-03-16 DIAGNOSIS — Z79891 Long term (current) use of opiate analgesic: Secondary | ICD-10-CM

## 2014-03-16 DIAGNOSIS — I10 Essential (primary) hypertension: Secondary | ICD-10-CM | POA: Diagnosis present

## 2014-03-16 HISTORY — PX: ROBOT ASSISTED LAPAROSCOPIC RADICAL PROSTATECTOMY: SHX5141

## 2014-03-16 HISTORY — PX: LYMPHADENECTOMY: SHX5960

## 2014-03-16 LAB — GLUCOSE, CAPILLARY
GLUCOSE-CAPILLARY: 111 mg/dL — AB (ref 70–99)
Glucose-Capillary: 133 mg/dL — ABNORMAL HIGH (ref 70–99)
Glucose-Capillary: 165 mg/dL — ABNORMAL HIGH (ref 70–99)

## 2014-03-16 LAB — TYPE AND SCREEN
ABO/RH(D): B POS
Antibody Screen: NEGATIVE

## 2014-03-16 SURGERY — ROBOTIC ASSISTED LAPAROSCOPIC RADICAL PROSTATECTOMY
Anesthesia: General

## 2014-03-16 MED ORDER — SENNOSIDES-DOCUSATE SODIUM 8.6-50 MG PO TABS
2.0000 | ORAL_TABLET | Freq: Every day | ORAL | Status: DC
  Administered 2014-03-16 – 2014-03-18 (×3): 2 via ORAL
  Filled 2014-03-16 (×3): qty 2

## 2014-03-16 MED ORDER — HYDROCHLOROTHIAZIDE 12.5 MG PO CAPS
12.5000 mg | ORAL_CAPSULE | Freq: Every day | ORAL | Status: DC
  Administered 2014-03-17 – 2014-03-19 (×3): 12.5 mg via ORAL
  Filled 2014-03-16 (×3): qty 1

## 2014-03-16 MED ORDER — NEOSTIGMINE METHYLSULFATE 10 MG/10ML IV SOLN
INTRAVENOUS | Status: AC
  Filled 2014-03-16: qty 1

## 2014-03-16 MED ORDER — ROCURONIUM BROMIDE 100 MG/10ML IV SOLN
INTRAVENOUS | Status: AC
  Filled 2014-03-16: qty 1

## 2014-03-16 MED ORDER — MOMETASONE FURO-FORMOTEROL FUM 100-5 MCG/ACT IN AERO
2.0000 | INHALATION_SPRAY | Freq: Two times a day (BID) | RESPIRATORY_TRACT | Status: DC
  Administered 2014-03-16 – 2014-03-19 (×6): 2 via RESPIRATORY_TRACT
  Filled 2014-03-16: qty 8.8

## 2014-03-16 MED ORDER — PROPOFOL 10 MG/ML IV BOLUS
INTRAVENOUS | Status: DC | PRN
  Administered 2014-03-16: 140 mg via INTRAVENOUS

## 2014-03-16 MED ORDER — SODIUM CHLORIDE 0.9 % IJ SOLN
INTRAMUSCULAR | Status: AC
  Filled 2014-03-16: qty 20

## 2014-03-16 MED ORDER — ALBUTEROL SULFATE (2.5 MG/3ML) 0.083% IN NEBU: 2.5000 mg | INHALATION_SOLUTION | Freq: Four times a day (QID) | RESPIRATORY_TRACT | Status: DC | PRN

## 2014-03-16 MED ORDER — FENTANYL CITRATE 0.05 MG/ML IJ SOLN
INTRAMUSCULAR | Status: DC | PRN
  Administered 2014-03-16 (×7): 50 ug via INTRAVENOUS

## 2014-03-16 MED ORDER — ONDANSETRON HCL 4 MG/2ML IJ SOLN
INTRAMUSCULAR | Status: AC
  Filled 2014-03-16: qty 2

## 2014-03-16 MED ORDER — DEXAMETHASONE SODIUM PHOSPHATE 10 MG/ML IJ SOLN
INTRAMUSCULAR | Status: AC
  Filled 2014-03-16: qty 1

## 2014-03-16 MED ORDER — LACTATED RINGERS IV SOLN
INTRAVENOUS | Status: DC
Start: 2014-03-16 — End: 2014-03-16
  Administered 2014-03-16: 1000 mL via INTRAVENOUS

## 2014-03-16 MED ORDER — LIDOCAINE HCL (CARDIAC) 20 MG/ML IV SOLN
INTRAVENOUS | Status: DC | PRN
  Administered 2014-03-16: 100 mg via INTRAVENOUS

## 2014-03-16 MED ORDER — CEFAZOLIN SODIUM-DEXTROSE 2-3 GM-% IV SOLR
2.0000 g | INTRAVENOUS | Status: AC
  Administered 2014-03-16: 2 g via INTRAVENOUS

## 2014-03-16 MED ORDER — PROPOFOL 10 MG/ML IV BOLUS
INTRAVENOUS | Status: AC
  Filled 2014-03-16: qty 20

## 2014-03-16 MED ORDER — HYDROMORPHONE HCL 1 MG/ML IJ SOLN
INTRAMUSCULAR | Status: AC
  Filled 2014-03-16: qty 1

## 2014-03-16 MED ORDER — HYDROMORPHONE HCL 1 MG/ML IJ SOLN
INTRAMUSCULAR | Status: DC | PRN
  Administered 2014-03-16 (×5): .4 mg via INTRAVENOUS

## 2014-03-16 MED ORDER — LIDOCAINE-EPINEPHRINE (PF) 1 %-1:200000 IJ SOLN
INTRAMUSCULAR | Status: DC | PRN
  Administered 2014-03-16: 19 mL

## 2014-03-16 MED ORDER — CIPROFLOXACIN IN D5W 400 MG/200ML IV SOLN
INTRAVENOUS | Status: AC
  Filled 2014-03-16: qty 200

## 2014-03-16 MED ORDER — ONDANSETRON HCL 4 MG/2ML IJ SOLN
4.0000 mg | INTRAMUSCULAR | Status: DC | PRN
  Administered 2014-03-17 – 2014-03-18 (×4): 4 mg via INTRAVENOUS
  Filled 2014-03-16 (×5): qty 2

## 2014-03-16 MED ORDER — GLYCOPYRROLATE 0.2 MG/ML IJ SOLN
INTRAMUSCULAR | Status: DC | PRN
Start: 2014-03-16 — End: 2014-03-16
  Administered 2014-03-16: .6 mg via INTRAVENOUS

## 2014-03-16 MED ORDER — LISINOPRIL-HYDROCHLOROTHIAZIDE 20-12.5 MG PO TABS: 1.0000 | ORAL_TABLET | Freq: Every morning | ORAL | Status: DC

## 2014-03-16 MED ORDER — FENTANYL CITRATE 0.05 MG/ML IJ SOLN
INTRAMUSCULAR | Status: AC
  Filled 2014-03-16: qty 5

## 2014-03-16 MED ORDER — LIDOCAINE HCL (CARDIAC) 20 MG/ML IV SOLN
INTRAVENOUS | Status: AC
  Filled 2014-03-16: qty 5

## 2014-03-16 MED ORDER — FENTANYL CITRATE 0.05 MG/ML IJ SOLN
INTRAMUSCULAR | Status: AC
  Filled 2014-03-16: qty 2

## 2014-03-16 MED ORDER — CEFAZOLIN SODIUM 1-5 GM-% IV SOLN
1.0000 g | Freq: Three times a day (TID) | INTRAVENOUS | Status: AC
  Administered 2014-03-16 – 2014-03-17 (×2): 1 g via INTRAVENOUS
  Filled 2014-03-16 (×2): qty 50

## 2014-03-16 MED ORDER — SODIUM CHLORIDE 0.9 % IJ SOLN
3.0000 mL | Freq: Two times a day (BID) | INTRAMUSCULAR | Status: DC
  Administered 2014-03-18: 3 mL via INTRAVENOUS

## 2014-03-16 MED ORDER — DIPHENHYDRAMINE HCL 50 MG/ML IJ SOLN: 12.5000 mg | Freq: Four times a day (QID) | INTRAMUSCULAR | Status: DC | PRN

## 2014-03-16 MED ORDER — LACTATED RINGERS IR SOLN
Status: DC | PRN
  Administered 2014-03-16: 1000 mL

## 2014-03-16 MED ORDER — LISINOPRIL 20 MG PO TABS
20.0000 mg | ORAL_TABLET | Freq: Every day | ORAL | Status: DC
  Administered 2014-03-17 – 2014-03-19 (×3): 20 mg via ORAL
  Filled 2014-03-16 (×3): qty 1

## 2014-03-16 MED ORDER — OXYCODONE-ACETAMINOPHEN 5-325 MG PO TABS
1.0000 | ORAL_TABLET | ORAL | Status: DC | PRN
  Administered 2014-03-16: 1 via ORAL
  Administered 2014-03-17 – 2014-03-19 (×11): 2 via ORAL
  Filled 2014-03-16 (×10): qty 2
  Filled 2014-03-16: qty 1
  Filled 2014-03-16: qty 2

## 2014-03-16 MED ORDER — BUPIVACAINE-EPINEPHRINE (PF) 0.25% -1:200000 IJ SOLN
INTRAMUSCULAR | Status: AC
  Filled 2014-03-16: qty 30

## 2014-03-16 MED ORDER — KETAMINE HCL 10 MG/ML IJ SOLN
INTRAMUSCULAR | Status: DC | PRN
  Administered 2014-03-16: 20 mg via INTRAVENOUS

## 2014-03-16 MED ORDER — INSULIN ASPART 100 UNIT/ML ~~LOC~~ SOLN: 0.0000 [IU] | Freq: Every day | SUBCUTANEOUS | Status: DC

## 2014-03-16 MED ORDER — ONDANSETRON HCL 4 MG/2ML IJ SOLN: 4.0000 mg | Freq: Once | INTRAMUSCULAR | Status: DC | PRN

## 2014-03-16 MED ORDER — OXYBUTYNIN CHLORIDE 5 MG PO TABS
5.0000 mg | ORAL_TABLET | Freq: Three times a day (TID) | ORAL | Status: DC | PRN
  Administered 2014-03-16 – 2014-03-17 (×2): 5 mg via ORAL
  Filled 2014-03-16 (×4): qty 1

## 2014-03-16 MED ORDER — SODIUM CHLORIDE 0.9 % IV SOLN
INTRAVENOUS | Status: DC
  Administered 2014-03-16 (×2): via INTRAVENOUS

## 2014-03-16 MED ORDER — CEFAZOLIN SODIUM-DEXTROSE 2-3 GM-% IV SOLR
INTRAVENOUS | Status: AC
  Filled 2014-03-16: qty 50

## 2014-03-16 MED ORDER — NEOSTIGMINE METHYLSULFATE 10 MG/10ML IV SOLN
INTRAVENOUS | Status: DC | PRN
  Administered 2014-03-16: 4 mg via INTRAVENOUS

## 2014-03-16 MED ORDER — MORPHINE SULFATE 2 MG/ML IJ SOLN
2.0000 mg | INTRAMUSCULAR | Status: DC | PRN
  Administered 2014-03-16: 4 mg via INTRAVENOUS
  Administered 2014-03-16 – 2014-03-17 (×2): 2 mg via INTRAVENOUS
  Administered 2014-03-17: 4 mg via INTRAVENOUS
  Administered 2014-03-17: 2 mg via INTRAVENOUS
  Administered 2014-03-17: 4 mg via INTRAVENOUS
  Filled 2014-03-16: qty 1
  Filled 2014-03-16 (×2): qty 2
  Filled 2014-03-16: qty 1
  Filled 2014-03-16: qty 2
  Filled 2014-03-16: qty 1

## 2014-03-16 MED ORDER — ROCURONIUM BROMIDE 100 MG/10ML IV SOLN
INTRAVENOUS | Status: DC | PRN
  Administered 2014-03-16: 20 mg via INTRAVENOUS
  Administered 2014-03-16: 10 mg via INTRAVENOUS
  Administered 2014-03-16: 50 mg via INTRAVENOUS
  Administered 2014-03-16: 10 mg via INTRAVENOUS

## 2014-03-16 MED ORDER — ONDANSETRON HCL 4 MG/2ML IJ SOLN
INTRAMUSCULAR | Status: DC | PRN
Start: 2014-03-16 — End: 2014-03-16
  Administered 2014-03-16: 4 mg via INTRAVENOUS

## 2014-03-16 MED ORDER — HYDROMORPHONE HCL 1 MG/ML IJ SOLN
0.2500 mg | INTRAMUSCULAR | Status: DC | PRN
  Administered 2014-03-16 (×2): 0.25 mg via INTRAVENOUS
  Administered 2014-03-16 (×2): 0.5 mg via INTRAVENOUS

## 2014-03-16 MED ORDER — MIDAZOLAM HCL 2 MG/2ML IJ SOLN
INTRAMUSCULAR | Status: AC
  Filled 2014-03-16: qty 2

## 2014-03-16 MED ORDER — ALPRAZOLAM 1 MG PO TABS
1.0000 mg | ORAL_TABLET | Freq: Three times a day (TID) | ORAL | Status: DC | PRN
  Administered 2014-03-17 – 2014-03-18 (×4): 1 mg via ORAL
  Filled 2014-03-16 (×4): qty 1

## 2014-03-16 MED ORDER — LACTATED RINGERS IV SOLN
INTRAVENOUS | Status: DC
  Administered 2014-03-16: 15:00:00 via INTRAVENOUS
  Administered 2014-03-16: 1000 mL via INTRAVENOUS

## 2014-03-16 MED ORDER — HYDROMORPHONE HCL 2 MG/ML IJ SOLN
INTRAMUSCULAR | Status: AC
  Filled 2014-03-16: qty 1

## 2014-03-16 MED ORDER — SODIUM CHLORIDE 0.9 % IJ SOLN: 3.0000 mL | INTRAMUSCULAR | Status: DC | PRN

## 2014-03-16 MED ORDER — DEXAMETHASONE SODIUM PHOSPHATE 10 MG/ML IJ SOLN
INTRAMUSCULAR | Status: DC | PRN
  Administered 2014-03-16: 10 mg via INTRAVENOUS

## 2014-03-16 MED ORDER — DIPHENHYDRAMINE HCL 12.5 MG/5ML PO ELIX: 12.5000 mg | ORAL_SOLUTION | Freq: Four times a day (QID) | ORAL | Status: DC | PRN

## 2014-03-16 MED ORDER — SODIUM CHLORIDE 0.9 % IJ SOLN
INTRAMUSCULAR | Status: AC
  Filled 2014-03-16: qty 10

## 2014-03-16 MED ORDER — STERILE WATER FOR IRRIGATION IR SOLN
Status: DC | PRN
  Administered 2014-03-16: 3000 mL

## 2014-03-16 MED ORDER — CIPROFLOXACIN IN D5W 400 MG/200ML IV SOLN
400.0000 mg | INTRAVENOUS | Status: AC
Start: 2014-03-16 — End: 2014-03-16
  Administered 2014-03-16: 400 mg via INTRAVENOUS

## 2014-03-16 MED ORDER — GLYCOPYRROLATE 0.2 MG/ML IJ SOLN
INTRAMUSCULAR | Status: AC
  Filled 2014-03-16: qty 3

## 2014-03-16 MED ORDER — HYDRALAZINE HCL 20 MG/ML IJ SOLN
INTRAMUSCULAR | Status: AC
  Administered 2014-03-16: 5 mg
  Filled 2014-03-16: qty 1

## 2014-03-16 MED ORDER — SODIUM CHLORIDE 0.9 % IV SOLN: 250.0000 mL | INTRAVENOUS | Status: DC | PRN

## 2014-03-16 MED ORDER — SODIUM CHLORIDE 0.9 % IV BOLUS (SEPSIS)
1000.0000 mL | Freq: Once | INTRAVENOUS | Status: AC
  Administered 2014-03-16: 1000 mL via INTRAVENOUS

## 2014-03-16 MED ORDER — SUCCINYLCHOLINE CHLORIDE 20 MG/ML IJ SOLN
INTRAMUSCULAR | Status: DC | PRN
  Administered 2014-03-16: 100 mg via INTRAVENOUS

## 2014-03-16 MED ORDER — KETAMINE HCL 10 MG/ML IJ SOLN
INTRAMUSCULAR | Status: AC
  Filled 2014-03-16: qty 1

## 2014-03-16 MED ORDER — CEFAZOLIN SODIUM-DEXTROSE 2-3 GM-% IV SOLR
2.0000 g | Freq: Once | INTRAVENOUS | Status: AC
  Administered 2014-03-16: 2 g via INTRAVENOUS

## 2014-03-16 MED ORDER — INSULIN ASPART 100 UNIT/ML ~~LOC~~ SOLN
0.0000 [IU] | Freq: Three times a day (TID) | SUBCUTANEOUS | Status: DC
  Administered 2014-03-17 (×2): 2 [IU] via SUBCUTANEOUS

## 2014-03-16 MED ORDER — MIDAZOLAM HCL 5 MG/5ML IJ SOLN
INTRAMUSCULAR | Status: DC | PRN
  Administered 2014-03-16: 2 mg via INTRAVENOUS

## 2014-03-16 MED ORDER — ALBUTEROL SULFATE HFA 108 (90 BASE) MCG/ACT IN AERS
2.0000 | INHALATION_SPRAY | Freq: Four times a day (QID) | RESPIRATORY_TRACT | Status: DC | PRN
Start: 2014-03-16 — End: 2014-03-16

## 2014-03-16 MED ORDER — BUPIVACAINE LIPOSOME 1.3 % IJ SUSP
20.0000 mL | Freq: Once | INTRAMUSCULAR | Status: AC
  Administered 2014-03-16: 20 mL
  Filled 2014-03-16: qty 20

## 2014-03-16 MED ORDER — HYDRALAZINE HCL 20 MG/ML IJ SOLN
20.0000 mg | Freq: Once | INTRAMUSCULAR | Status: AC
  Administered 2014-03-16 (×2): 5 mg via INTRAVENOUS

## 2014-03-16 SURGICAL SUPPLY — 51 items
CABLE HIGH FREQUENCY MONO STRZ (ELECTRODE) ×4 IMPLANT
CANISTER SUCTION 2500CC (MISCELLANEOUS) ×4 IMPLANT
CATH FOLEY 2WAY SLVR 18FR 30CC (CATHETERS) ×4 IMPLANT
CATH ROBINSON RED A/P 16FR (CATHETERS) IMPLANT
CATH TIEMANN FOLEY 18FR 5CC (CATHETERS) ×4 IMPLANT
CHLORAPREP W/TINT 26ML (MISCELLANEOUS) ×4 IMPLANT
CLIP LIGATING HEM O LOK PURPLE (MISCELLANEOUS) ×10 IMPLANT
CLIP SUT LAPRA TY ABSORB (SUTURE) ×2 IMPLANT
CLOTH BEACON ORANGE TIMEOUT ST (SAFETY) ×4 IMPLANT
CONT SPEC 4OZ CLIKSEAL STRL BL (MISCELLANEOUS) ×2 IMPLANT
COVER SURGICAL LIGHT HANDLE (MISCELLANEOUS) ×4 IMPLANT
COVER TIP SHEARS 8 DVNC (MISCELLANEOUS) ×2 IMPLANT
COVER TIP SHEARS 8MM DA VINCI (MISCELLANEOUS) ×4
DECANTER SPIKE VIAL GLASS SM (MISCELLANEOUS) ×4 IMPLANT
DRAPE CAMERA CLOSED 9X96 (DRAPES) ×4 IMPLANT
DRAPE SURG IRRIG POUCH 19X23 (DRAPES) ×4 IMPLANT
DRSG TEGADERM 2-3/8X2-3/4 SM (GAUZE/BANDAGES/DRESSINGS) ×16 IMPLANT
DRSG TEGADERM 4X4.75 (GAUZE/BANDAGES/DRESSINGS) ×8 IMPLANT
DRSG TEGADERM 6X8 (GAUZE/BANDAGES/DRESSINGS) ×8 IMPLANT
ELECT REM PT RETURN 9FT ADLT (ELECTROSURGICAL) ×4
ELECTRODE REM PT RTRN 9FT ADLT (ELECTROSURGICAL) ×2 IMPLANT
GAUZE SPONGE 2X2 8PLY STRL LF (GAUZE/BANDAGES/DRESSINGS) ×2 IMPLANT
GLOVE BIO SURGEON STRL SZ 6.5 (GLOVE) ×3 IMPLANT
GLOVE BIO SURGEONS STRL SZ 6.5 (GLOVE) ×1
GLOVE BIOGEL M STRL SZ7.5 (GLOVE) ×12 IMPLANT
GOWN STRL REUS W/TWL LRG LVL3 (GOWN DISPOSABLE) ×8 IMPLANT
GOWN STRL REUS W/TWL XL LVL3 (GOWN DISPOSABLE) ×8 IMPLANT
HOLDER FOLEY CATH W/STRAP (MISCELLANEOUS) ×4 IMPLANT
IV LACTATED RINGERS 1000ML (IV SOLUTION) ×4 IMPLANT
KIT ACCESSORY DA VINCI DISP (KITS) ×2
KIT ACCESSORY DVNC DISP (KITS) ×2 IMPLANT
KIT PROCEDURE DA VINCI SI (MISCELLANEOUS) ×2
KIT PROCEDURE DVNC SI (MISCELLANEOUS) ×2 IMPLANT
LIQUID BAND (GAUZE/BANDAGES/DRESSINGS) ×4 IMPLANT
NDL INSUFFLATION 14GA 120MM (NEEDLE) ×2 IMPLANT
NEEDLE INSUFFLATION 14GA 120MM (NEEDLE) ×4 IMPLANT
PACK ROBOT UROLOGY CUSTOM (CUSTOM PROCEDURE TRAY) ×4 IMPLANT
SET TUBE IRRIG SUCTION NO TIP (IRRIGATION / IRRIGATOR) ×4 IMPLANT
SHEET LAVH (DRAPES) ×2 IMPLANT
SOLUTION ELECTROLUBE (MISCELLANEOUS) ×4 IMPLANT
SPONGE GAUZE 2X2 STER 10/PKG (GAUZE/BANDAGES/DRESSINGS) ×2
SUT ETHILON 3 0 PS 1 (SUTURE) ×4 IMPLANT
SUT MNCRL AB 4-0 PS2 18 (SUTURE) ×8 IMPLANT
SUT VIC AB 0 CT1 27 (SUTURE) ×12
SUT VIC AB 0 CT1 27XBRD ANTBC (SUTURE) ×2 IMPLANT
SUT VICRYL 0 UR6 27IN ABS (SUTURE) ×6 IMPLANT
SUT VLOC BARB 180 ABS3/0GR12 (SUTURE) ×8
SUTURE VLOC BRB 180 ABS3/0GR12 (SUTURE) ×4 IMPLANT
TOWEL OR NON WOVEN STRL DISP B (DISPOSABLE) ×8 IMPLANT
TROCAR 12M 150ML BLUNT (TROCAR) ×4 IMPLANT
WATER STERILE IRR 1500ML POUR (IV SOLUTION) ×8 IMPLANT

## 2014-03-16 NOTE — H&P (Signed)
Reason For Visit Prostate cancer discussion   History of Present Illness       This is a 63 year old African-American male who was referred by Dr. Blenda Bridegroom, M.D. who follows up today for discussion of his new diagnosis prostate cancer..    The patient has a family history of prostate cancer, his father died of prostate cancer.   The patient does not see a doctor on a regular basis and his most recent visit with a primary care physician revealed that he had both an elevated PSA and diabetes. The patient has a lot of the lower urinary tract symptoms including urinary frequency, nocturia 5 or 6, intermittent dysuria, and chronic pelvic/scrotal pain. These have been going on for for 5 years, but have been getting progressively worse over the last year. The patient also has back pain and hip pain. Denies any lower extremity swelling. Patient has no history of urinary tract infections. The patient denies any fevers, night sweats, weight loss.    PSA History:  16.66 on 09/25/13  14.33 on 09/14/13  IPSS: 29, QOL6  SHIM: 3   Prostate cancer profile  Stage: T1c  PSA: 16.66  Biopsy ,6 /12 cores positive: Gleason 3+3 in all cores, relatively low volume within each core. 4 positive cores on the patient's left side including the lateral apex and mid sections.  Prostate volume: 60 cc.  The patient underwent a bone scan to evaluate for metastatic disease given the patient's back pain and elevated PSA which was negative for metastatic lesions.    During the procedure the patient complained of significant rectal pain and had severe pain when the probe went through the rectal wall. He had no associated prostate pain. This rectal pain has been going on for several years now. The patient is also complaining of rectal pain with defecation. He has a history of diverticulitis although this pain is more constant in nature. Otherwise, the patient recovered from the biopsy well without any  ongoing hematuria or dysuria. He states that he is also had some paradoxical improvement in his erectile function. He continues to require narcotics to control his perineal and rectal pain.   Past Medical History Problems  1. History of arthritis (V13.4) 2. History of chronic obstructive lung disease (V12.69) 3. History of diabetes mellitus (V12.29) 4. History of hyperlipidemia (V12.29) 5. History of hypertension (V12.59) 6. History of pulmonary emphysema (V12.69)  Surgical History Problems  1. History of Back Surgery 2. History of Hernia Repair  Current Meds 1. Albuterol Sulfate HFA 108 MCG/ACT AERS;  Therapy: (Recorded:25Jun2015) to Recorded 2. Lipitor 20 MG Oral Tablet (Atorvastatin Calcium);  Therapy: (Recorded:25Jun2015) to Recorded 3. Lisinopril-Hydrochlorothiazide 20-12.5 MG Oral Tablet;  Therapy: (Recorded:25Jun2015) to Recorded 4. MetFORMIN HCl - 500 MG Oral Tablet;  Therapy: (Recorded:25Jun2015) to Recorded 5. Multi-Vitamin TABS;  Therapy: (Recorded:25Jun2015) to Recorded 6. Naproxen 500 MG Oral Tablet;  Therapy: (Recorded:25Jun2015) to Recorded 7. Oxycodone-Acetaminophen 10-325 MG Oral Tablet; TAKE 1 TABLET EVERY 6 HOURS AS  NEEDED FOR PAIN;  Therapy: [id number] to (Evaluate:05Sep2015); Last Rx:06Aug2015 Ordered 8. Qvar 80 MCG/ACT Inhalation Aerosol Solution;  Therapy: (Recorded:25Jun2015) to Recorded  Allergies Medication  1. Vicodin TABS  Family History Problems  1. Family history of prostate cancer (L57.26) : Father  Social History Problems  1. Denied: History of Alcohol use 2. Caffeine use (V49.89) 3. Death in the family, father   age 59 4. Death in the family, mother   age 79 5. Former smoker (V15.82) 6. Retired 70. Separated from  significant other (V61.03) 8. Three children  Results/Data Urine [Data Includes: Last 1 Day]   24Aug2015  COLOR YELLOW   APPEARANCE CLEAR   SPECIFIC GRAVITY 1.025   pH 5.5   GLUCOSE NEG mg/dL  BILIRUBIN NEG    KETONE NEG mg/dL  BLOOD NEG   PROTEIN NEG mg/dL  UROBILINOGEN 0.2 mg/dL  NITRITE NEG   LEUKOCYTE ESTERASE NEG    Patient's urinalysis today reveals no evidence of infection, inflammation, or microscopic hematuria   Assessment cT1c, Gleason 3+3, PSA 16.6, intermediate risk prostate cancer.  Mild to moderate erectile dysfunction  Severe lower urinary tract symptoms  Chronic rectal pain/ rectal wall tenderness   Plan Elevated prostate specific antigen (PSA)  1. Renew: Oxycodone-Acetaminophen 10-325 MG Oral Tablet; TAKE 1 TABLET EVERY 6  HOURS AS NEEDED FOR PAIN Health Maintenance  2. Urology Referral Referral  Referral to gastroenterocology for evaluation of rectal pain/pain  with defecation  Status: Hold For - Appointment,Records  Requested for: 24Aug2015 3. UA With REFLEX; [Do Not Release]; Status:Complete;   Done: [id number] 02:57PM Prostate cancer  4. Start: Zolpidem Tartrate 5 MG Oral Tablet (Ambien); TAKE 1 TABLET AT  BEDTIME AS  NEEDED FOR INSOMNIA 5. Follow-up After Referral Office  Follow-up  Status: Hold For - Appointment,Date of Service   Requested for: 24Aug2015  Discussion/Summary I went over the patient's pathology report and explained to him the significance of intermediate risk prostate cancer. I detailed the treatment options for him including active surveillance, radiation therapy including both external beam and brachytherapy, and surgical extirpation. Given the volume of his disease as well as his PSA I don't think active surveillance is a viable option for him. We briefly discussed radiation therapy although the patient is not interested in this at this time. I'm reluctant to refer him to radiation oncology given that he already has significant rectal wall tenderness. I urged him to see a gastroenterologist to evaluate his pain and difficulties with bowel movements in the event that the patient has an undiagnosed inflammatory bowel disease. If we are able to  control the patient's rectal symptoms he may be a better candidate for brachytherapy. However, the bulk of our conversation was around robotic-assisted prostatectomy.  We discussed prostatectomy and specifically robotic prostatectomy with bilateral pelvic lymphadenectomy being the technique that I most commonly perform. I showed the patient on their abdomen the approximately 6 small incision (trocar) sites as well as presumed extraction sites with robotic approach as well as possible open incision sites should open conversion be necessary. We discussed peri-operative risks including bleeding, infection, deep vein thrombosis, pulmonary embolism, compartment syndrome, nuropathy / neuropraxia, heart attack, stroke, death, as well as long-term risks such as non-cure / need for additional therapy. We specifically addressed that the procedure would compromise urinary control leading to stress incontinence which typically resolves with time and pelvic rehabilitation (Kegel's, etc..), but can sometimes be permanent and require additional therapy including surgery. We also specifically addressed sexual sequellae including significant erectile dysfunction which typically partially resolves with time but can also be permanent and require additional therapy including surgery.     We discussed the typical hospital course including usual 1-2 night hospitalization, discharge with foley catheter in place usually for 1-2 weeks before voiding trial as well as usually 2 week recovery until able to perform most non-strenuous activity and 6 weeks until able to return to most jobs and more strenuous activity such as exercise.   I'm referring the patient to gastroenterology for further evaluation of  his rectal discomfort and symptoms. I will follow-up with the patient after the consult and move forward with surgical planning.

## 2014-03-16 NOTE — Transfer of Care (Signed)
Immediate Anesthesia Transfer of Care Note  Patient: Ryan Hopkins  Procedure(s) Performed: Procedure(s): ROBOTIC ASSISTED LAPAROSCOPIC RADICAL PROSTATECTOMY (N/A) LYMPHADENECTOMY (Bilateral)  Patient Location: PACU  Anesthesia Type:General  Level of Consciousness: sedated  Airway & Oxygen Therapy: Patient Spontanous Breathing and Patient connected to face mask oxygen  Post-op Assessment: Report given to PACU RN and Post -op Vital signs reviewed and stable  Post vital signs: Reviewed and stable  Complications: No apparent anesthesia complications

## 2014-03-16 NOTE — Progress Notes (Signed)
Unable to work with patient to get ready for surgery due to his multiple cell phone calls. Asked patient to Silence his phone so I could get him ready for his surgery.  Patient informed in detail that hospital staff not responsible for valuable and will need to have them locked up with security. He will have a form he will then take to security to get them back. Patient states he does not want them locked up. Informed him that the 2 patient belonging bags and his backpack will stay in room 4 and we will bring them up to him when he is admitted to a room and we know room number.

## 2014-03-16 NOTE — Plan of Care (Signed)
Problem: Consults Goal: Radical Robotic Prostatectomy Patient Education Outcome: Completed/Met Date Met:  03/16/14

## 2014-03-16 NOTE — Anesthesia Postprocedure Evaluation (Signed)
  Anesthesia Post-op Note  Patient: Ryan Hopkins  Procedure(s) Performed: Procedure(s) (LRB): ROBOTIC ASSISTED LAPAROSCOPIC RADICAL PROSTATECTOMY (N/A) LYMPHADENECTOMY (Bilateral)  Patient Location: PACU  Anesthesia Type: General  Level of Consciousness: awake and alert   Airway and Oxygen Therapy: Patient Spontanous Breathing  Post-op Pain: mild  Post-op Assessment: Post-op Vital signs reviewed, Patient's Cardiovascular Status Stable, Respiratory Function Stable, Patent Airway and No signs of Nausea or vomiting  Last Vitals:  Filed Vitals:   03/16/14 1750  BP:   Pulse:   Temp: 37.2 C  Resp:     Post-op Vital Signs: stable   Complications: No apparent anesthesia complications

## 2014-03-16 NOTE — Op Note (Signed)
Preoperative diagnosis:  1. Prostate Cancer   Postoperative diagnosis:  1. same   Procedure: 1. Robotic laparoscopic radical prostatectomy  2. Bilateral pelvic lymph node  Surgeon: Ardis Hughs, MD Assistant: Dr. Curt Bears, MD  Anesthesia: General  Complications: None  Intraoperative findings: normal anatomy, bilateral nerve spare  EBL: 154ml  Specimens: prostate, bilateral pelvic lymph nodes  Indication: Ryan Hopkins is a 63 y.o. patient with intermediate risk prostate cancer.  After reviewing the management options for treatment, he elected to proceed with the above surgical procedure(s). We have discussed the potential benefits and risks of the procedure, side effects of the proposed treatment, the likelihood of the patient achieving the goals of the procedure, and any potential problems that might occur during the procedure or recuperation. Informed consent has been obtained.  Description of procedure: The patient was consented in the preoperative holding area. He is in brought back to the operating room placed the table in supine position. General anesthesia was then induced and endotracheal tube was inserted. He was then placed in dorsolithotomy position and placed in steep Trendelenburg. He was then prepped and draped in the routine sterile fashion. We then began by making a 12 mm incision infraumbilical midline incision the skin. Then placed a Veress needle into the peritoneal space and inflated the abdomen to 15 mm of mercury. Then placed a 12 mm trocar and inserted the 0 robotic lens. We then placed 2 additional a millimeter trochars in the patient's left lower abdomen proximally 9 cm apart and 2 trochars on the patient's right lower abdomen, one was in a millimeter trocar and one most lateral was a 12 mm trocar which was used as the assistant port. A 5 mm trocar was placed by triangulating the 2 right lateral ports as a second assistant port. These ports were all placed  under visual guidance. Once the ports were noted to be satisfactory position the robot was docked. We started with the 0 lens, monopolar scissors and the right hand and the PK forceps the left hand as well as a fenestrated grasper as the third arm on the left-hand side.  We began our dissection the posterior plane incising the peritoneum at the level of the vas deferens. Isolated the left vas deferens and dissected it proximally towards the spermatic cord for 5 cm prior to ligating it. Then used this as traction to isolate the left seminal vesicle which was then undressed bluntly, all vessels were cauterized with a combination of bipolar and the monopolar scissors. Once this had been dissected out laterally and posteriorly I  turned our attention to the anterior plane and freed the the left seminal vesicle from the surrounding tissues. We then turned our attention to the right side and similarly dissected out the right vas deferens and the right seminal vesicle. Once the SVs had been freed we turned our attention to the posterior plane and bluntly dissected the tissue between the rectum and the posterior wall of the prostate bluntly out towards the apex. wwe then began our pelvic lymph node dissection by incising the posterior peritoneum the patient's right between the ureter and the vas deferens followed down along the external iliac vein. The lymph node packet was then taken between the pelvic sidewall and the obturator nerve as well as the packet above the obturator nerve up to the iliac vein bifurcation. Clips were placed at the distal aspect of the dissection at the circumflex vein as well as down in the along the pelvic  sidewall. Once the node packet was freed what clips were placed across the nodal packet to indicate that this was the right side. A similar dissection then ensued on the patient's left-hand side and the obturator nerve was removed.  At this point the bladder was taken down starting at the  urachal remnant with a combination of both blunt dissection and sharp dissection with monopolar cautery the bladder was dropped down in the usual fashion to the medial umbilical ligaments laterally and the dorsal vein of the prostate anteriorly creating our space of Retzius. We then turned our attention to the endopelvic fascia which was incised laterally starting on the patient's right-hand side the levator muscles were pushed off the prostate laterally up towards the dorsal vein complex on the right-hand side. This process was then repeated on the left-hand side and a nice notch was created for the dorsal vein stitch on each side the dorsal vein. I then passed a 0 Vicryl on a CT1 needle around the dorsal venous complex tied off with a slipknot and then anchoring it to the periosteum of the pubic bone anteriorly suspending the dorsal venous complex.   We then located the bladder neck at the vesicoprostatic junction and the monopolar scissors dissected down through the perivesical tissues and the bladder neck down to the prostatic urethra. The catheter was then deflated and pulled through our urethral opening and then used to retract the prostate anteriorly for the posterior bladder neck dissection. Once through the bladder neck and into the posterior plane of the prostate the SVs were brought through the opening. The left pedicle was then isolated and systematically ligated with Weck clips and scissors. The nerve bundle was then peeled off the posterior lateral aspect of the prostate and bluntly dissected away off the prostate. This was then repeated on the right side, except due to the patient's cancer burden on the right no significant attempt was made to preserve the nerve bundle and a wide dissection was performed.   I then came down through the dorsal venous complex anteriorly down to the membranous urethra using the monopolar. Once down to the urethra the urethra was transected sharply and the apex of the  prostate was then dissected off the levator and rectourethralis muscles. Once the apex of the prostate had been dissected free we came back to the base of the prostate and bluntly push the rectum and nerve vascular bundle off the prostate the patient's left and used clips on the patient's right to free the prostate. Once the prostate was free was placed in the Endo Catch bag and the string brought to the 5 mm port. The pelvis was then irrigated with normal saline and noted to be relatively hemostatic.   Using the 3-0 v lock suture a Rocco stitch was performed pulling the bladder neck down to the urethral stump. The vesicourethral anastomosis was then completed with 2 interlocking 3-0 V. lock sutures running the anastomosis in the 6:00 position to the 12:00 position on each side and then tying it off on the top. The final catheter was then passed through the patient's urethra and into the bladder and 120 cc was instilled into the bladder to test the anastomosis. As there was no leak a 67 [place] Blake drain was passed through the left lateral port and placed around the vesicourethral anastomosis. A 12 mm assistant port on the right lateral side was then closed with 0 Vicryl with the help of the [employer] needle. The 12 mm  midline infraumbilical incision was then extended another centimeter taken down and the fascia opened to remove the Endo Catch bag with the prostate specimen. The fascia was then closed with a 0 Vicryl and all skin ports were closed with 4-0 Monocryl in a subcutaneous fashion. Dermabond glue was then applied to the incisions. The drain was then secured to the skin with a 0 nylon stitch and dressing applied.   At the end of the case all laps needles and sponges had been accounted for. There no immediate complications. The patient returned to the PACU in stable condition.

## 2014-03-17 LAB — CBC
HEMATOCRIT: 38.3 % — AB (ref 39.0–52.0)
HEMOGLOBIN: 12.8 g/dL — AB (ref 13.0–17.0)
MCH: 30.5 pg (ref 26.0–34.0)
MCHC: 33.4 g/dL (ref 30.0–36.0)
MCV: 91.4 fL (ref 78.0–100.0)
Platelets: 246 10*3/uL (ref 150–400)
RBC: 4.19 MIL/uL — AB (ref 4.22–5.81)
RDW: 14.1 % (ref 11.5–15.5)
WBC: 13.9 10*3/uL — ABNORMAL HIGH (ref 4.0–10.5)

## 2014-03-17 LAB — BASIC METABOLIC PANEL
ANION GAP: 5 (ref 5–15)
BUN: 9 mg/dL (ref 6–23)
CHLORIDE: 107 meq/L (ref 96–112)
CO2: 23 meq/L (ref 19–32)
CREATININE: 1.17 mg/dL (ref 0.50–1.35)
Calcium: 8.6 mg/dL (ref 8.4–10.5)
GFR calc Af Amer: 74 mL/min — ABNORMAL LOW (ref >90)
GFR calc non Af Amer: 64 mL/min — ABNORMAL LOW (ref >90)
Glucose, Bld: 131 mg/dL — ABNORMAL HIGH (ref 70–99)
POTASSIUM: 3.9 meq/L (ref 3.7–5.3)
Sodium: 135 mEq/L — ABNORMAL LOW (ref 137–147)

## 2014-03-17 LAB — GLUCOSE, CAPILLARY
GLUCOSE-CAPILLARY: 122 mg/dL — AB (ref 70–99)
Glucose-Capillary: 130 mg/dL — ABNORMAL HIGH (ref 70–99)
Glucose-Capillary: 117 mg/dL — ABNORMAL HIGH (ref 70–99)

## 2014-03-17 LAB — HEMOGLOBIN A1C
HEMOGLOBIN A1C: 6 % — AB (ref <5.7)
Mean Plasma Glucose: 126 mg/dL — ABNORMAL HIGH (ref <117)

## 2014-03-17 MED ORDER — ZOLPIDEM TARTRATE 5 MG PO TABS
5.0000 mg | ORAL_TABLET | Freq: Every evening | ORAL | Status: DC | PRN
  Administered 2014-03-17: 5 mg via ORAL
  Filled 2014-03-17: qty 1

## 2014-03-17 NOTE — Progress Notes (Signed)
1 Day Post-Op Subjective: POD1 s/p Radical prostatectomy Patient complaining of some pain overnight, relatively well controlled with PO narcotics and IV breakthrough.  No bowel function to date (no flatus).  Ambulated x2.  Overall feeling ok.  Objective: Vital signs in last 24 hours: Temp:  [97.5 F (36.4 C)-99.1 F (37.3 C)] 99 F (37.2 C) (11/14 0535) Pulse Rate:  [65-98] 65 (11/14 0535) Resp:  [14-18] 16 (11/14 0535) BP: (124-192)/(84-102) 126/89 mmHg (11/14 0535) SpO2:  [94 %-100 %] 100 % (11/14 0823) Arterial Line BP: (188-197)/(98-102) 188/102 mmHg (11/13 1700) FiO2 (%):  [21 %] 21 % (11/14 0823) Weight:  [92.08 kg (203 lb)] 92.08 kg (203 lb) (11/13 1012)  Intake/Output from previous day: 11/13 0701 - 11/14 0700 In: 2178.3 [I.V.:2128.3; IV Piggyback:50] Out: 2865 [Urine:2300; Drains:415; Blood:150] Intake/Output this shift: Total I/O In: -  Out: 30 [Drains:30]  Physical Exam:  General: Alert and oriented CV: RRR Lungs: Clear Abdomen: Soft, ND, tender to palpation in all quadrants. Incisions: Slight separation of midline wound at base, two steri-strips in place, no drainage, no concerning erythema Ext: NT, No erythema  Lab Results:  Recent Labs  03/14/14 1600 03/17/14 0453  HGB 15.7 12.8*  HCT 46.1 38.3*   BMET  Recent Labs  03/14/14 1600 03/17/14 0453  NA 139 135*  K 3.5* 3.9  CL 101 107  CO2 23 23  GLUCOSE 91 131*  BUN 16 9  CREATININE 1.28 1.17  CALCIUM 9.6 8.6     Studies/Results: No results found.  Assessment/Plan:  POD# 1 s/p robotic prostatectomy.  Overall progressing well.  Had moderately high JP output initially but it is tapering off since midnight.  Will advance diet, ambulate, medlock IV.  1) SL IVF 2) Ambulate, Incentive spirometry 3) Continue oral pain medication (patient has history of narcotic use) 4) Dulcolax suppository 5) Continue pelvic drain and monitor output - will send for creatinine if does not taper off  significantly   LOS: 1 day   Baltazar Najjar, Will 03/17/2014, 9:15 AM    Seen, examined and discussed with Dr. Baltazar Najjar and agree with assessment and plan.

## 2014-03-18 LAB — CBC
HEMATOCRIT: 37.1 % — AB (ref 39.0–52.0)
Hemoglobin: 12.8 g/dL — ABNORMAL LOW (ref 13.0–17.0)
MCH: 31.3 pg (ref 26.0–34.0)
MCHC: 34.5 g/dL (ref 30.0–36.0)
MCV: 90.7 fL (ref 78.0–100.0)
Platelets: 256 10*3/uL (ref 150–400)
RBC: 4.09 MIL/uL — AB (ref 4.22–5.81)
RDW: 14.2 % (ref 11.5–15.5)
WBC: 14.6 10*3/uL — ABNORMAL HIGH (ref 4.0–10.5)

## 2014-03-18 LAB — GLUCOSE, CAPILLARY
GLUCOSE-CAPILLARY: 106 mg/dL — AB (ref 70–99)
Glucose-Capillary: 122 mg/dL — ABNORMAL HIGH (ref 70–99)
Glucose-Capillary: 111 mg/dL — ABNORMAL HIGH (ref 70–99)

## 2014-03-18 LAB — CREATININE, FLUID (PLEURAL, PERITONEAL, JP DRAINAGE): Creat, Fluid: 1.2 mg/dL

## 2014-03-18 MED ORDER — PROMETHAZINE HCL 25 MG/ML IJ SOLN
6.2500 mg | Freq: Four times a day (QID) | INTRAMUSCULAR | Status: DC | PRN
  Administered 2014-03-18 – 2014-03-19 (×3): 6.25 mg via INTRAVENOUS
  Filled 2014-03-18 (×3): qty 1

## 2014-03-18 MED ORDER — BISACODYL 10 MG RE SUPP
10.0000 mg | Freq: Every day | RECTAL | Status: DC | PRN
  Administered 2014-03-18: 10 mg via RECTAL
  Filled 2014-03-18: qty 1

## 2014-03-18 MED ORDER — POTASSIUM CHLORIDE IN NACL 20-0.45 MEQ/L-% IV SOLN
INTRAVENOUS | Status: DC
  Administered 2014-03-18 (×2): via INTRAVENOUS
  Filled 2014-03-18 (×4): qty 1000

## 2014-03-18 NOTE — Plan of Care (Signed)
Problem: Phase I Progression Outcomes Goal: Pain controlled with appropriate interventions Outcome: Completed/Met Date Met:  03/18/14     

## 2014-03-18 NOTE — Progress Notes (Signed)
Patient had restless night, continues to complain of pain and nausea. Patient vomited x1 large amount of undigested food this am. Decreased diet to clear liquid. Patient ambulated in the hall x 2 during the shift. Large amount of drainage noted from JP tube and around JP site.. Will continue to monitor. SRP, RN

## 2014-03-18 NOTE — Progress Notes (Signed)
2 Days Post-Op Subjective: POD2 s/p Radical prostatectomy Patient had restless night due largely to vomiting last night and this morning.  Abdominal pain stable.  He ambulated in the halls multiple times yesterday.  He started passing flatus this am.  Continues to have some leakage around the JP drain site.  Objective: Vital signs in last 24 hours: Temp:  [97.9 F (36.6 C)-98.9 F (37.2 C)] 98.9 F (37.2 C) (11/15 0558) Pulse Rate:  [76-84] 76 (11/15 0558) Resp:  [16-18] 18 (11/15 0558) BP: (103-137)/(63-83) 121/83 mmHg (11/15 0558) SpO2:  [91 %-96 %] 96 % (11/15 0558)  Intake/Output from previous day: 11/14 0701 - 11/15 0700 In: 420 [P.O.:420] Out: 4065 [Urine:3725; Drains:340] Intake/Output this shift: Total I/O In: -  Out: 30 [Drains:30]  Physical Exam:  General: Alert and oriented CV: RRR Lungs: Clear Abdomen: Soft, ND, tender to palpation in all quadrants. Incisions: Slight separation of midline wound at base, two steri-strips in place, no drainage, no concerning erythema Ext: NT, No erythema  Lab Results:  Recent Labs  03/17/14 0453 03/18/14 0522  HGB 12.8* 12.8*  HCT 38.3* 37.1*   BMET  Recent Labs  03/17/14 0453  NA 135*  K 3.9  CL 107  CO2 23  GLUCOSE 131*  BUN 9  CREATININE 1.17  CALCIUM 8.6     Studies/Results: No results found.  Assessment/Plan:  POD# 2 s/p robotic prostatectomy.  Suffering from vomiting last night and this am.  Diet backed down to clear liquids and phenergan added.  Restarted IV fluids.  He is ambulating.  JP with moderate output - JP fluid creatinine equal to serum.    1) Restart IV fluids for maintenance until taking liquids better 2) Ambulate, Incentive spirometry 3) Continue oral pain medication (patient has history of narcotic use) 4) Dulcolax suppository 5) Continue pelvic drain and monitor output - no evidence of leak per JP creatinine level 6) Continue anti-emetics prn nausea.  Reassuring that he is passing  flatus this am.   LOS: 2 days   Ryan Hopkins, Will 03/18/2014, 10:14 AM

## 2014-03-19 ENCOUNTER — Encounter (HOSPITAL_COMMUNITY): Payer: Self-pay | Admitting: Urology

## 2014-03-19 LAB — GLUCOSE, CAPILLARY
Glucose-Capillary: 128 mg/dL — ABNORMAL HIGH (ref 70–99)
Glucose-Capillary: 101 mg/dL — ABNORMAL HIGH (ref 70–99)

## 2014-03-19 LAB — CBC
HEMATOCRIT: 40.4 % (ref 39.0–52.0)
HEMOGLOBIN: 13.5 g/dL (ref 13.0–17.0)
MCH: 30.8 pg (ref 26.0–34.0)
MCHC: 33.4 g/dL (ref 30.0–36.0)
MCV: 92 fL (ref 78.0–100.0)
Platelets: 270 10*3/uL (ref 150–400)
RBC: 4.39 MIL/uL (ref 4.22–5.81)
RDW: 14 % (ref 11.5–15.5)
WBC: 9.9 10*3/uL (ref 4.0–10.5)

## 2014-03-19 MED ORDER — KETOROLAC TROMETHAMINE 30 MG/ML IJ SOLN
30.0000 mg | Freq: Once | INTRAMUSCULAR | Status: AC
  Administered 2014-03-19: 30 mg via INTRAVENOUS
  Filled 2014-03-19: qty 1

## 2014-03-19 MED ORDER — ACETAMINOPHEN 10 MG/ML IV SOLN
1000.0000 mg | Freq: Four times a day (QID) | INTRAVENOUS | Status: DC
  Administered 2014-03-19: 1000 mg via INTRAVENOUS
  Filled 2014-03-19 (×3): qty 100

## 2014-03-19 MED ORDER — OXYCODONE-ACETAMINOPHEN 7.5-325 MG PO TABS: 1.0000 | ORAL_TABLET | Freq: Four times a day (QID) | ORAL | Status: DC | PRN

## 2014-03-19 MED ORDER — DOCUSATE SODIUM 100 MG PO CAPS: 100.0000 mg | ORAL_CAPSULE | Freq: Two times a day (BID) | ORAL | Status: DC | PRN

## 2014-03-19 MED ORDER — ONDANSETRON HCL 4 MG PO TABS: 4.0000 mg | ORAL_TABLET | Freq: Three times a day (TID) | ORAL | Status: DC | PRN

## 2014-03-19 NOTE — Progress Notes (Signed)
JP D/C dressing applied. Pt tolerated it well

## 2014-03-19 NOTE — Progress Notes (Signed)
Intense foley care education given to patient. Pt verbalizes understanding. X2 leg bags given

## 2014-03-19 NOTE — Discharge Instructions (Signed)
Activity:  You are encouraged to ambulate frequently (about every hour during waking hours) to help prevent blood clots from forming in your legs or lungs.  However, you should not engage in any heavy lifting (> 10-15 lbs), strenuous activity, or straining.  Diet: You should advance your diet as instructed by your physician.  It will be normal to have some bloating, nausea, and abdominal discomfort intermittently.  Prescriptions:  You will be provided a prescription for pain medication to take as needed.  If your pain is not severe enough to require the prescription pain medication, you may take extra strength Tylenol instead which will have less side effects.  You should also take a prescribed stool softener to avoid straining with bowel movements as the prescription pain medication may constipate you.  Incisions: You may remove your dressing bandages 48 hours after surgery if not removed in the hospital.  You will either have some small staples or special tissue glue at each of the incision sites. Once the bandages are removed (if present), the incisions may stay open to air.  You may start showering (but not soaking or bathing in water) the 2nd day after surgery and the incisions simply need to be patted dry after the shower.  No additional care is needed.  What to call us about: You should call the office ([phone]) if you develop fever > 101 or develop persistent vomiting. Activity:  You are encouraged to ambulate frequently (about every hour during waking hours) to help prevent blood clots from forming in your legs or lungs.  However, you should not engage in any heavy lifting (> 10-15 lbs), strenuous activity, or straining.    Foley Catheter Care A soft, flexible tube (Foley catheter) may have been placed in your bladder to drain urine and fluid. Follow these instructions: Taking Care of the Catheter Keep the area where the catheter leaves your body clean.  Attach the catheter to the leg so  there is no tension on the catheter.  Keep the drainage bag below the level of the bladder, but keep it OFF the floor.  Do not take long soaking baths. Your caregiver will give instructions about showering.  Wash your hands before touching ANYTHING related to the catheter or bag.  Using mild soap and warm water on a washcloth:  Clean the area closest to the catheter insertion site using a circular motion around the catheter.  Clean the catheter itself by wiping AWAY from the insertion site for several inches down the tube.  NEVER wipe upward as this could sweep bacteria up into the urethra (tube in your body that normally drains the bladder) and cause infection.  Place a small amount of sterile lubricant at the tip of the penis where the catheter is entering.  Taking Care of the Drainage Bags Two drainage bags may be taken home: a large overnight drainage bag, and a smaller leg bag which fits underneath clothing.  It is okay to wear the overnight bag at any time, but NEVER wear the smaller leg bag at night.  Keep the drainage bag well below the level of your bladder. This prevents backflow of urine into the bladder and allows the urine to drain freely.  Anchor the tubing to your leg to prevent pulling or tension on the catheter. Use tape or a leg strap provided by the hospital.  Empty the drainage bag when it is 1/2 to 3/4 full. Wash your hands before and after touching the bag.  Periodically   check the tubing for kinks to make sure there is no pressure on the tubing which could restrict the flow of urine.  Changing the Drainage Bags Cleanse both ends of the clean bag with alcohol before changing.  Pinch off the rubber catheter to avoid urine spillage during the disconnection.  Disconnect the dirty bag and connect the clean one.  Empty the dirty bag carefully to avoid a urine spill.  Attach the new bag to the leg with tape or a leg strap.  Cleaning the Drainage Bags Whenever a drainage bag is  disconnected, it must be cleaned quickly so it is ready for the next use.  Wash the bag in warm, soapy water.  Rinse the bag thoroughly with warm water.  Soak the bag for 30 minutes in a solution of white vinegar and water (1 cup vinegar to 1 quart warm water).  Rinse with warm water.  SEEK MEDICAL CARE IF:  You have chills or night sweats.  You are leaking around your catheter or have problems with your catheter. It is not uncommon to have sporadic leakage around your catheter as a result of bladder spasms. If the leakage stops, there is not much need for concern. If you are uncertain, call your caregiver.  You develop side effects that you think are coming from your medicines.  SEEK IMMEDIATE MEDICAL CARE IF:  You are suddenly unable to urinate. Check to see if there are any kinks in the drainage tubing that may cause this. If you cannot find any kinks, call your caregiver immediately. This is an emergency.  You develop shortness of breath or chest pains.  Bleeding persists or clots develop in your urine.  You have a fever.  You develop pain in your back or over your lower belly (abdomen).  You develop pain or swelling in your legs.  Any problems you are having get worse rather than better.  MAKE SURE YOU:  Understand these instructions.  Will watch your condition.  Will get help right away if you are not doing well or get worse.   

## 2014-03-19 NOTE — Progress Notes (Signed)
3 Days Post-Op Subjective: POD3 s/p Radical prostatectomy Feeling better, tolerating regular diet this AM.   BM yesterday evening. Pain better controlled. Walking.  Objective: Vital signs in last 24 hours: Temp:  [97.7 F (36.5 C)-98.6 F (37 C)] 98 F (36.7 C) (11/16 0641) Pulse Rate:  [75-90] 80 (11/16 0641) Resp:  [16] 16 (11/16 0641) BP: (119-154)/(86-97) 119/86 mmHg (11/16 0641) SpO2:  [90 %-100 %] 99 % (11/16 0641)  Intake/Output from previous day: 11/15 0701 - 11/16 0700 In: 1025 [I.V.:925; IV Piggyback:100] Out: 2380 [Urine:2200; Drains:180] Intake/Output this shift: Total I/O In: 1261.7 [I.V.:1261.7] Out: -   Physical Exam:  General: Alert and oriented CV: RRR Lungs: Clear Abdomen: Soft, ND, tender to palpation in all quadrants. Incisions: Slight separation of midline wound at base, two steri-strips in place, no drainage, no concerning erythema Ext: NT, No erythema  Lab Results:  Recent Labs  03/17/14 0453 03/18/14 0522 03/19/14 0432  HGB 12.8* 12.8* 13.5  HCT 38.3* 37.1* 40.4   BMET  Recent Labs  03/17/14 0453  NA 135*  K 3.9  CL 107  CO2 23  GLUCOSE 131*  BUN 9  CREATININE 1.17  CALCIUM 8.6     Studies/Results: No results found.  Assessment/Plan:  POD# 3 s/p robotic prostatectomy. Tolerating regular diet, moving bowels, pain better controlled with IV toradol and IV tylenol.  Will normalize patient this AM, d/c drain and discharge this PM if he doesn't have any more nausea/emesis and his pain is reasonable well controlled.   LOS: 3 days   Ardis Hughs 03/19/2014, 7:47 AM

## 2014-03-19 NOTE — Discharge Summary (Signed)
Date of admission: 03/16/2014  Date of discharge: 03/19/2014  Admission diagnosis: prostate cancer  Discharge diagnosis: prostate cancer, s/p RALP  Secondary diagnoses:  Patient Active Problem List   Diagnosis Date Noted  . Prostate cancer 03/16/2014    History and Physical: For full details, please see admission history and physical. Briefly, Ryan Hopkins is a 63 y.o. year old patient with intermediate risk prostate cancer.   Hospital Course: Patient tolerated the procedure well.  He was then transferred to the floor after an uneventful PACU stay.  His hospital course was uncomplicated.  On POD#3  he had met discharge criteria: was eating a regular diet, was up and ambulating independently,  pain was well controlled, was comfortable managing his catheter, and was ready to for discharge.   Laboratory values:   Recent Labs  03/17/14 0453 03/18/14 0522 03/19/14 0432  WBC 13.9* 14.6* 9.9  HGB 12.8* 12.8* 13.5  HCT 38.3* 37.1* 40.4    Recent Labs  03/17/14 0453  NA 135*  K 3.9  CL 107  CO2 23  GLUCOSE 131*  BUN 9  CREATININE 1.17  CALCIUM 8.6   No results for input(s): LABPT, INR in the last 72 hours. No results for input(s): LABURIN in the last 72 hours. Results for orders placed or performed during the hospital encounter of 03/14/14  Urine culture     Status: None   Collection Time: 03/14/14  3:27 PM  Result Value Ref Range Status   Specimen Description URINE, CLEAN CATCH  Final   Special Requests NONE  Final   Culture  Setup Time   Final    03/14/2014 21:41 Performed at [place] Performed at [facility]   Final   Culture NO GROWTH Performed at [facility]   Final   Report Status 03/15/2014 FINAL  Final    Disposition: Home  Discharge instruction: The patient was instructed to be ambulatory but told to refrain from heavy lifting, strenuous activity, or driving.   Discharge medications:    Medication List    ASK your doctor about these medications        albuterol 108 (90 BASE) MCG/ACT inhaler  Commonly known as:  PROVENTIL HFA;VENTOLIN HFA  Inhale 2 puffs into the lungs every 6 (six) hours as needed for wheezing or shortness of breath.     ALPRAZolam 1 MG tablet  Commonly known as:  XANAX  Take 1 mg by mouth 3 (three) times daily as needed for anxiety.     aspirin EC 81 MG tablet  Take 81 mg by mouth every morning.     fluticasone-salmeterol 115-21 MCG/ACT inhaler  Commonly known as:  ADVAIR HFA  Inhale 2 puffs into the lungs 2 (two) times daily.     lisinopril-hydrochlorothiazide 20-12.5 MG per tablet  Commonly known as:  PRINZIDE,ZESTORETIC  Take 1 tablet by mouth every morning.     metFORMIN 500 MG tablet  Commonly known as:  GLUCOPHAGE  Take 500 mg by mouth daily with breakfast.     oxyCODONE 5 MG immediate release tablet  Commonly known as:  ROXICODONE  Take 1 tablet (5 mg total) by mouth every 6 (six) hours as needed for severe pain.     oxyCODONE-acetaminophen 7.5-325 MG per tablet  Commonly known as:  PERCOCET  Take 1 tablet by mouth every 4 (four) hours as needed for pain.     zolpidem 5 MG tablet  Commonly known as:  AMBIEN  Take 5 mg by mouth at bedtime as needed for sleep.        Followup:  November 22nd for catheter removal.

## 2014-03-19 NOTE — Care Management Note (Signed)
    Page 1 of 1   03/19/2014     3:57:40 PM CARE MANAGEMENT NOTE 03/19/2014  Patient:  Ryan Hopkins, Ryan Hopkins   Account Number:  [id number]  Date Initiated:  03/19/2014  Documentation initiated by:  Dessa Phi  Subjective/Objective Assessment:   [address] [place].     Action/Plan:   FROM HOME.   Anticipated DC Date:  03/19/2014   Anticipated DC Plan:  [place]  CM consult      Choice offered to / List presented to:             Status of service:  Completed, signed off Medicare Important Message given?  YES (If response is "NO", the following Medicare IM given date fields will be blank) Date Medicare IM given:  03/19/2014 Medicare IM given by:  [facility] Date Additional Medicare IM given:   Additional Medicare IM given by:    Discharge Disposition:  HOME/SELF CARE  Per UR Regulation:  Reviewed for med. necessity/level of care/duration of stay  If discussed at [place] of Stay Meetings, dates discussed:    Comments:  03/19/14 Kamoni Gentles RN,BSN NCM [phone] D/C HOME NO [place].

## 2014-04-05 ENCOUNTER — Telehealth: Payer: Self-pay | Admitting: *Deleted

## 2014-04-05 NOTE — Telephone Encounter (Signed)
Pt states he may have a conflict with the 70/14/1030 appt, please call.

## 2014-04-06 ENCOUNTER — Ambulatory Visit: Payer: Medicare Other | Admitting: Podiatrist

## 2014-05-02 ENCOUNTER — Encounter (HOSPITAL_COMMUNITY): Payer: Self-pay | Admitting: Emergency Medicine

## 2014-05-02 ENCOUNTER — Emergency Department (HOSPITAL_COMMUNITY)
Admission: EM | Admit: 2014-05-02 | Discharge: 2014-05-02 | Disposition: A | Discharge status: Home / Self Care | Payer: Medicare Other | Attending: Emergency Medicine | Admitting: Emergency Medicine

## 2014-05-02 DIAGNOSIS — I10 Essential (primary) hypertension: Secondary | ICD-10-CM | POA: Diagnosis not present

## 2014-05-02 DIAGNOSIS — Z8546 Personal history of malignant neoplasm of prostate: Secondary | ICD-10-CM | POA: Insufficient documentation

## 2014-05-02 DIAGNOSIS — G8929 Other chronic pain: Secondary | ICD-10-CM | POA: Insufficient documentation

## 2014-05-02 DIAGNOSIS — M479 Spondylosis, unspecified: Secondary | ICD-10-CM | POA: Insufficient documentation

## 2014-05-02 DIAGNOSIS — J449 Chronic obstructive pulmonary disease, unspecified: Secondary | ICD-10-CM | POA: Insufficient documentation

## 2014-05-02 DIAGNOSIS — R200 Anesthesia of skin: Secondary | ICD-10-CM | POA: Diagnosis not present

## 2014-05-02 DIAGNOSIS — M545 Low back pain, unspecified: Secondary | ICD-10-CM

## 2014-05-02 DIAGNOSIS — Z87891 Personal history of nicotine dependence: Secondary | ICD-10-CM | POA: Insufficient documentation

## 2014-05-02 DIAGNOSIS — Z8719 Personal history of other diseases of the digestive system: Secondary | ICD-10-CM | POA: Diagnosis not present

## 2014-05-02 DIAGNOSIS — Z7951 Long term (current) use of inhaled steroids: Secondary | ICD-10-CM | POA: Diagnosis not present

## 2014-05-02 DIAGNOSIS — E119 Type 2 diabetes mellitus without complications: Secondary | ICD-10-CM | POA: Diagnosis not present

## 2014-05-02 DIAGNOSIS — Z79899 Other long term (current) drug therapy: Secondary | ICD-10-CM | POA: Insufficient documentation

## 2014-05-02 DIAGNOSIS — Z7982 Long term (current) use of aspirin: Secondary | ICD-10-CM | POA: Insufficient documentation

## 2014-05-02 DIAGNOSIS — Z87448 Personal history of other diseases of urinary system: Secondary | ICD-10-CM | POA: Insufficient documentation

## 2014-05-02 DIAGNOSIS — R011 Cardiac murmur, unspecified: Secondary | ICD-10-CM | POA: Diagnosis not present

## 2014-05-02 MED ORDER — OXYCODONE-ACETAMINOPHEN 5-325 MG PO TABS
2.0000 | ORAL_TABLET | Freq: Once | ORAL | Status: AC
  Administered 2014-05-02: 2 via ORAL
  Filled 2014-05-02: qty 2

## 2014-05-02 MED ORDER — TRAMADOL HCL 50 MG PO TABS: 50.0000 mg | ORAL_TABLET | Freq: Four times a day (QID) | ORAL | Status: DC | PRN

## 2014-05-02 MED ORDER — PREDNISONE 20 MG PO TABS: ORAL_TABLET | ORAL | Status: DC

## 2014-05-02 MED ORDER — IBUPROFEN 200 MG PO TABS
600.0000 mg | ORAL_TABLET | Freq: Once | ORAL | Status: AC
  Administered 2014-05-02: 600 mg via ORAL
  Filled 2014-05-02: qty 3

## 2014-05-02 MED ORDER — PREDNISONE 20 MG PO TABS
60.0000 mg | ORAL_TABLET | Freq: Once | ORAL | Status: DC
  Filled 2014-05-02: qty 3

## 2014-05-02 MED ORDER — CYCLOBENZAPRINE HCL 5 MG PO TABS: 5.0000 mg | ORAL_TABLET | Freq: Three times a day (TID) | ORAL | Status: DC | PRN

## 2014-05-02 NOTE — ED Notes (Signed)
Pt states that he has been having low back pain and bilateral leg pain x 2 months.  Denies injury.

## 2014-05-02 NOTE — Discharge Instructions (Signed)
Today you were given prednisone (a steroid dose pack) to help with your low back pain and associated numbness and tingling in your legs. Be sure to monitor your blood sugar as this may increase your sugar.  Take your medications as prescribed. Be sure to establish care with a primary care provider for ongoing healthcare needs including chronic low back pain.  Be sure to also establish care with pain management. See below for resources.   ED Resources Pain Management Centers/Resources in the [place] [address] [place] [address] [place], [place] [phone]  [facility] [address] [place], [place] [address] [phone]  [place] [facility] [address] [place] [address] [phone]  [facility] [place] [address] [phone]  Emergency Department Resource Guide 1) Find a Doctor and Pay Out of Pocket Although you won't have to find out who is covered by your insurance plan, it is a good idea to ask around and get recommendations. You will then need to call the office and see if the doctor you have chosen will accept you as a new patient and what types of options they offer for patients who are self-pay. Some doctors offer discounts or will set up payment plans for their patients who do not have insurance, but you will need to ask so you aren't surprised when you get to your appointment.  2) Contact Your Local Health Department Not all health departments have doctors that can see patients for sick visits, but many do, so it is worth a call to see if yours does. If you don't know where your local health department is, you can check in your phone book. The CDC also has a tool to help you locate your state's health department, and many state  websites also have listings of all of their local health departments.  3) Find a [facility] If your illness is not likely to be very severe or complicated, you may want to try a walk in clinic. These are popping up all over the country in pharmacies, drugstores, and shopping centers. They're usually staffed by nurse practitioners or physician assistants that have been trained to treat common illnesses and complaints. They're usually fairly quick and inexpensive. However, if you have serious medical issues or chronic medical problems, these are probably not your best option.  No Primary Care Doctor: - Call Health Connect at  [phone] - they can help you locate a primary care doctor that  accepts your insurance, provides certain services, etc. - Physician Referral Service- [phone]  Chronic Pain Problems: Organization         Address  Phone   Notes  [facility]  [phone] Patients need to be referred by their primary care doctor.   Medication Assistance: Organization         Address  Phone   Notes  [facility] Medication [facility] [address] [place], [place] [address] [phone] --Must be a resident of [facility] -- Must have NO insurance coverage whatsoever (no Medicaid/ Medicare, etc.) -- The pt. MUST have a primary care doctor that directs their care regularly and follows them in the community   MedAssist  [phone]   [employer]  [phone]    Agencies that provide inexpensive medical  care: Organization         Address                                                       Phone                                                                            Notes  [place]  [phone]   [facility]    [phone]   [facility] [place], [place] [address] [phone]   [place] [address] [place] [phone]   Planned Parenthood    [phone]   [facility]    [phone]   [address], [place] Phone:  [phone], Fax:  [phone] Hours of Operation:  9 am - 6 pm, M-F.  Also accepts Medicaid/Medicare and self-pay.  [facility] for [place] [address], [address], [place] Phone: [phone], Fax: [phone]. Hours of Operation:  8:30 am - 5:30 pm, M-F.  Also accepts Medicaid and self-pay.  [facility] [place] [address] [place] Phone: [phone]   [place], [place], [place] [phone], Ext. [phone] Mondays & Thursdays: 7-9 AM.  First 15 patients are seen on a first come, first serve basis.    [place] Providers:  Organization         Address                                                                       Phone                               Notes  [facility] [address],  [phone] Also accepts self-pay patients.  [facility] [address], [place]  [phone]   [place], [address], [place] [phone]   [facility] [address], [place] [phone]   Lucianne Lei [address], [place]   [phone] Only accepts [place] Access [place] patients after they have their name applied to their card.   Self-Pay (no insurance) in [facility]:   [employer]  Phone               Notes  Sickle Cell Patients, [facility] [place] [phone]   [facility] [place] [phone]   [facility] [place]  [place], [address], [place] [phone]   [facility]/Dr. Osei-Bonsu  [address] [place] or [address], [place] [phone] Phone number for both [place] and [place] locations is the same.  Urgent Medical and [facility] [address] [place] [phone]   [facility] [address], [place] or [address] [phone]   [facility] [address] [place] [phone], phone; [phone], fax Sees patients 1st and 3rd Saturday of every month.  Must not qualify for public or private insurance (i.e. Medicaid, Medicare, [place] Health Choice, Veterans' Benefits)  Household income should be no more than 200% of the poverty level The clinic cannot treat you if you are pregnant or think you are pregnant  Sexually transmitted diseases are not treated at the clinic.    Dental Care: Organization         Address                                  Phone                       Notes  [facility] Department of [facility] [place] [phone] Accepts children up to age 26 who are enrolled in [place] or [place]; pregnant women with a Medicaid card; and children who have applied for Medicaid or [place] Health Choice, but were declined, whose parents can pay a reduced fee at time of service.  [facility] Department of [facility]  [address], [place] [phone] Accepts children up to age 32 who are enrolled in [place] or [facility]; pregnant women with a Medicaid card; and children who have applied for Medicaid or  Health Choice, but were declined, whose parents can pay a reduced fee at time of service.  [place] Adult Dental Access PROGRAM  [place] [phone] Patients are seen by appointment only. Walk-ins are not accepted. [place] will see patients 57 years of age and older. Monday - Tuesday (8am-5pm) Most  Wednesdays (8:30-5pm) $30 per visit, cash only  [facility] Adult Dental Access PROGRAM  [address], [facility] [phone] Patients are seen by appointment only. Walk-ins are not accepted. O'Brien will see patients 47 years of age and older. One Wednesday Evening (Monthly: Volunteer Based).  $30 per visit, cash only  [place]  [phone] for adults; Children under age 35, call Graduate Pediatric Dentistry at [phone]. Children aged 41-14, please call [phone] to request a pediatric application.  Dental services are provided in all areas of dental care including fillings, crowns and bridges, complete and partial dentures, implants, gum treatment, root canals, and extractions. Preventive care is also provided. Treatment is provided to both adults and children. Patients are selected via a lottery and there is often a waiting list.   [facility] [address], [place]  [phone] [email or web address]   [facility] [address] [place], [place] [phone], Ext. [phone] Second and Fourth Thursday of each month, opens at 6:30 AM; Clinic ends at 9 AM.  Patients are seen on a first-come first-served basis, and a limited number are seen during each clinic.   [facility]  [address] Hillard Danker [place], [place] [phone]   Eligibility Requirements You must have lived in [place], [place], or [place] counties for at least the last three months.   You cannot be eligible for state or federal sponsored [employer], including [employer], [place], or [employer].   You generally cannot be eligible for healthcare insurance through your employer.    How to apply: Eligibility screenings are held every Tuesday and Wednesday afternoon from 1:00 pm until 4:00 pm. You do not need an appointment for the interview!  [facility] [address], [place], [place]     Vici  [place] Department  [place]  [phone]    Behavioral Health Resources in the Community: Intensive Outpatient Programs Organization         Address                                              Phone              Notes  [place]. [address], [place], [place] [phone]   [facility] [address], [place], [place]   ADS: Alcohol & Drug Svcs [address], [place], [place]   [place] [address]  [place], [place] or [phone]   Substance Abuse Resources Organization         Address                                Phone  Notes  Alcohol and Drug Services  [phone]   [place]  [phone]   The [place]   Chinita Pester  [phone]   Residential & Outpatient Substance Abuse Program  [phone]   Psychological Services Organization         Address                                  Phone                Notes  [facility] [place]  [place]  [phone]   [place] [address], [place] or [phone]    Mobile Crisis Teams Organization         Address  Phone  Notes  [facility], Mobile Crisis Care Unit  [phone]   [facility]  [address] [place], [place]   [place] Levels [address], [place] [phone]    Self-Help/Support Groups Organization         Address                         Phone  Notes  Mental Health Assoc. of [place] - variety of support groups  [place] Call for more information  Narcotics Anonymous (NA), [facility]aring [facility] [address], [employer] [place]  2 meetings at this location   Special educational needs teacher          Address                                                    Phone              Notes  [facility] [place],    [place]  [phone]   [facility]  [address] [place] [address], [place], [place]   [place], [place] [phone] Admissions: 8am-3pm M-F  Incentives Substance [place] [address],    [place], [place] [phone]   [facility] [address] [place], [place], [place]   [facility] [address],  [place], [place]   [facility] Programs - Intensive Outpatient [address] Kristeen [address], [place], [place]   [facility] [place].,  [place], [place] [phone] or [phone]   [facility] Services (RTS) [address] [place], [place] Accepts Medicaid  [facility] [place] [address]  [place] [phone] Substance Abuse/Addiction Treatment   [facility] Organization         Address                                                            Phone                    Notes  [facility]  [phone]   Domenic Schwab, PhD [address] Arlis Porta [place], [place]   [phone] or [phone]   [facility] [place], [place] [phone]   [facility] [address], [place], [place] [phone] Insurance/Medicaid/sponsorship through [facility] and Families [address] [place]                                    [place], [place] [phone] [place] [address], [place] [phone]    Dr. Adele Schilder  [phone]   [facility] Dept. 1) [address], [place] 2) [place] 3)  [place], [place] [phone]  [phone]   [place] [phone] or [phone] (After Hours)

## 2014-05-02 NOTE — ED Provider Notes (Signed)
CSN: [id number]     Arrival date & time 05/02/14  0920 History   First MD Initiated Contact with Patient 05/02/14 1010     Chief Complaint  Patient presents with  . Back Pain  . Leg Pain     (Consider location/radiation/quality/duration/timing/severity/associated sxs/prior Treatment) HPI  Pt is a 63yo male with hx of degenerative arthritis of spine, chronic back pain, NIDDM, and prostate cancer, presenting to ED with c/o constant severe lower back pain that radiates down both legs with intermittent numbness. Denies new falls or injuries but reports being out of her percocet as pain became sore severe he had to take additional pain medication. Denies change in chronic symptoms of urinary incontinence (result of prostatectomy 2 months ago). Denies fever, n/v/d. States he is currently working on getting into pain management again but states he does not have a PCP.    Past Medical History  Diagnosis Date  . Hypertension   . COPD (chronic obstructive pulmonary disease)   . Emphysema   . Enlarged prostate   . Degenerative arthritis of spine   . Back pain, chronic   . Diverticulitis   . Diabetes mellitus without complication   . Anal fissure   . Prostate cancer 11/2013  . Heart murmur   . Nocturia    Past Surgical History  Procedure Laterality Date  . Lumbar disc surgery  2007    L4-L5  . Inguinal hernia repair Right years ago  . Robot assisted laparoscopic radical prostatectomy N/A 03/16/2014    Procedure: ROBOTIC ASSISTED LAPAROSCOPIC RADICAL PROSTATECTOMY;  Surgeon: Ardis Hughs, MD;  Location: [facility];  Service: Urology;  Laterality: N/A;  . Lymphadenectomy Bilateral 03/16/2014    Procedure: LYMPHADENECTOMY;  Surgeon: Ardis Hughs, MD;  Location: [facility];  Service: Urology;  Laterality: Bilateral;   Family History  Problem Relation Age of Onset  . Colon cancer Sister   . Prostate cancer Father   . Diabetes Mother   . Heart failure Mother   . Cancer Mother    cervical   History  Substance Use Topics  . Smoking status: Former Smoker -- 1.00 packs/day for 20 years    Quit date: 01/16/2014  . Smokeless tobacco: Never Used  . Alcohol Use: No    Review of Systems  Constitutional: Negative for fever and chills.  Gastrointestinal: Negative for nausea, vomiting, abdominal pain and diarrhea.  Musculoskeletal: Positive for back pain ( lower). Negative for myalgias.  Neurological: Positive for numbness ( bilateral legs (chronic)). Negative for syncope.  All other systems reviewed and are negative.     Allergies  Vicodin  Home Medications   Prior to Admission medications   Medication Sig Start Date End Date Taking? Authorizing Provider  albuterol (PROVENTIL HFA;VENTOLIN HFA) 108 (90 BASE) MCG/ACT inhaler Inhale 2 puffs into the lungs every 6 (six) hours as needed for wheezing or shortness of breath.    Historical Provider, MD  ALPRAZolam Duanne Moron) 1 MG tablet Take 1 mg by mouth 3 (three) times daily as needed for anxiety.    Historical Provider, MD  aspirin EC 81 MG tablet Take 81 mg by mouth every morning.     Historical Provider, MD  cyclobenzaprine (FLEXERIL) 5 MG tablet Take 1 tablet (5 mg total) by mouth 3 (three) times daily as needed for muscle spasms. 05/02/14   Noland Fordyce, PA-C  docusate sodium (COLACE) 100 MG capsule Take 1 capsule (100 mg total) by mouth 2 (two) times daily as needed (take to keep  stool soft.). 03/19/14   Ardis Hughs, MD  fluticasone-salmeterol (ADVAIR HFA) [phone] MCG/ACT inhaler Inhale 2 puffs into the lungs 2 (two) times daily.    Historical Provider, MD  lisinopril-hydrochlorothiazide (PRINZIDE,ZESTORETIC) 20-12.5 MG per tablet Take 1 tablet by mouth every morning.     Historical Provider, MD  metFORMIN (GLUCOPHAGE) 500 MG tablet Take 500 mg by mouth daily with breakfast.    Historical Provider, MD  ondansetron (ZOFRAN) 4 MG tablet Take 1 tablet (4 mg total) by mouth every 8 (eight) hours as needed for nausea  or vomiting. 03/19/14   Ardis Hughs, MD  oxyCODONE-acetaminophen (PERCOCET) 7.5-325 MG per tablet Take 1-2 tablets by mouth every 6 (six) hours as needed for pain. 03/19/14   Ardis Hughs, MD  predniSONE (DELTASONE) 20 MG tablet 3 tabs po day one, then 2 po daily x 4 days 05/02/14   Noland Fordyce, PA-C  traMADol (ULTRAM) 50 MG tablet Take 1 tablet (50 mg total) by mouth every 6 (six) hours as needed. 05/02/14   Noland Fordyce, PA-C  zolpidem (AMBIEN) 5 MG tablet Take 5 mg by mouth at bedtime as needed for sleep.    Historical Provider, MD   BP 117/80 mmHg  Pulse 95  Temp(Src) 97.6 F (36.4 C) (Oral)  Resp 16  SpO2 98% Physical Exam  Constitutional: He appears well-developed and well-nourished.  HENT:  Head: Normocephalic and atraumatic.  Eyes: Conjunctivae are normal. No scleral icterus.  Neck: Normal range of motion.  Cardiovascular: Normal rate, regular rhythm and normal heart sounds.   Pulmonary/Chest: Effort normal and breath sounds normal. No respiratory distress. He has no wheezes. He has no rales. He exhibits no tenderness.  Abdominal: Soft. Bowel sounds are normal. He exhibits no distension and no mass. There is no tenderness. There is no rebound and no guarding.  Musculoskeletal: Normal range of motion. He exhibits tenderness. He exhibits no edema.  Tenderness along lower lumbar spine and surrounding musculature. FROM all 4 extremities. 5/5 strength in upper extremities. 4/5 strength bilaterally in lower extremities (chronic weakness per pt).   Neurological: He is alert.  Normal gait  Skin: Skin is warm and dry. No erythema.  Nursing note and vitals reviewed.   ED Course  Procedures (including critical care time) Labs Review Labs Reviewed - No data to display  Imaging Review No results found.   EKG Interpretation None      MDM   Final diagnoses:  Chronic low back pain   Pt c/o chronic low back pain. Pt has hx of urinary incontinence due to  prostatectomy from prostate cancer, no change in his chronic symptoms. Pt requesting refills on his percocet. Per [place] Controlled Substance database, pt had a 4 days supply prescribed on 04/29/14 as well as 4 other prescriptions for percocet in the month of December alone.  Advised pt his pain will be treated in ED today, however, no percocet will be prescribed as he needs to find a PCP and establish care with Pain Management.  Rx: prednisone, flexeril 5mg , and tramadol.  Advised pt to closely monitor his CBG. Provided [employer] as well as Wellsite geologist. Home care instructions provided. Return precautions provided. Pt verbalized understanding and agreement with tx plan.   Noland Fordyce, PA-C 05/02/14 [place], MD 05/03/14 [phone]

## 2014-05-04 ENCOUNTER — Emergency Department (HOSPITAL_COMMUNITY)
Admission: EM | Admit: 2014-05-04 | Discharge: 2014-05-04 | Disposition: A | Discharge status: Home / Self Care | Payer: Medicare Other | Attending: Emergency Medicine | Admitting: Emergency Medicine

## 2014-05-04 ENCOUNTER — Encounter (HOSPITAL_COMMUNITY): Payer: Self-pay | Admitting: Emergency Medicine

## 2014-05-04 ENCOUNTER — Emergency Department (HOSPITAL_COMMUNITY): Payer: Medicare Other

## 2014-05-04 DIAGNOSIS — Z79899 Other long term (current) drug therapy: Secondary | ICD-10-CM | POA: Insufficient documentation

## 2014-05-04 DIAGNOSIS — R011 Cardiac murmur, unspecified: Secondary | ICD-10-CM | POA: Insufficient documentation

## 2014-05-04 DIAGNOSIS — E119 Type 2 diabetes mellitus without complications: Secondary | ICD-10-CM | POA: Insufficient documentation

## 2014-05-04 DIAGNOSIS — K5900 Constipation, unspecified: Secondary | ICD-10-CM

## 2014-05-04 DIAGNOSIS — G8929 Other chronic pain: Secondary | ICD-10-CM | POA: Diagnosis not present

## 2014-05-04 DIAGNOSIS — Z7982 Long term (current) use of aspirin: Secondary | ICD-10-CM | POA: Insufficient documentation

## 2014-05-04 DIAGNOSIS — Z87448 Personal history of other diseases of urinary system: Secondary | ICD-10-CM | POA: Insufficient documentation

## 2014-05-04 DIAGNOSIS — I10 Essential (primary) hypertension: Secondary | ICD-10-CM | POA: Diagnosis not present

## 2014-05-04 DIAGNOSIS — Z7951 Long term (current) use of inhaled steroids: Secondary | ICD-10-CM | POA: Diagnosis not present

## 2014-05-04 DIAGNOSIS — J449 Chronic obstructive pulmonary disease, unspecified: Secondary | ICD-10-CM | POA: Diagnosis not present

## 2014-05-04 DIAGNOSIS — Z8546 Personal history of malignant neoplasm of prostate: Secondary | ICD-10-CM

## 2014-05-04 DIAGNOSIS — Z87891 Personal history of nicotine dependence: Secondary | ICD-10-CM | POA: Insufficient documentation

## 2014-05-04 DIAGNOSIS — R319 Hematuria, unspecified: Secondary | ICD-10-CM | POA: Diagnosis present

## 2014-05-04 DIAGNOSIS — R300 Dysuria: Secondary | ICD-10-CM

## 2014-05-04 DIAGNOSIS — Z8719 Personal history of other diseases of the digestive system: Secondary | ICD-10-CM | POA: Diagnosis not present

## 2014-05-04 LAB — LIPASE, BLOOD: Lipase: 23 U/L (ref 11–59)

## 2014-05-04 LAB — URINALYSIS, ROUTINE W REFLEX MICROSCOPIC
Bilirubin Urine: NEGATIVE
Glucose, UA: NEGATIVE mg/dL
Ketones, ur: 15 mg/dL — AB
LEUKOCYTES UA: NEGATIVE
NITRITE: NEGATIVE
PH: 5 (ref 5.0–8.0)
Protein, ur: NEGATIVE mg/dL
SPECIFIC GRAVITY, URINE: 1.013 (ref 1.005–1.030)
Urobilinogen, UA: 0.2 mg/dL (ref 0.0–1.0)

## 2014-05-04 LAB — CBC WITH DIFFERENTIAL/PLATELET
Basophils Absolute: 0 10*3/uL (ref 0.0–0.1)
Basophils Relative: 0 % (ref 0–1)
EOS ABS: 0.1 10*3/uL (ref 0.0–0.7)
Eosinophils Relative: 1 % (ref 0–5)
HCT: 44.9 % (ref 39.0–52.0)
Hemoglobin: 14.9 g/dL (ref 13.0–17.0)
LYMPHS ABS: 1.9 10*3/uL (ref 0.7–4.0)
Lymphocytes Relative: 16 % (ref 12–46)
MCH: 29.9 pg (ref 26.0–34.0)
MCHC: 33.2 g/dL (ref 30.0–36.0)
MCV: 90 fL (ref 78.0–100.0)
MONO ABS: 1.1 10*3/uL — AB (ref 0.1–1.0)
Monocytes Relative: 10 % (ref 3–12)
Neutro Abs: 8.8 10*3/uL — ABNORMAL HIGH (ref 1.7–7.7)
Neutrophils Relative %: 73 % (ref 43–77)
Platelets: 250 10*3/uL (ref 150–400)
RBC: 4.99 MIL/uL (ref 4.22–5.81)
RDW: 14.9 % (ref 11.5–15.5)
WBC: 11.9 10*3/uL — AB (ref 4.0–10.5)

## 2014-05-04 LAB — COMPREHENSIVE METABOLIC PANEL
ALT: 15 U/L (ref 0–53)
AST: 26 U/L (ref 0–37)
Albumin: 4.5 g/dL (ref 3.5–5.2)
Alkaline Phosphatase: 88 U/L (ref 39–117)
Anion gap: 17 — ABNORMAL HIGH (ref 5–15)
BUN: 15 mg/dL (ref 6–23)
CALCIUM: 9.1 mg/dL (ref 8.4–10.5)
CO2: 17 mmol/L — ABNORMAL LOW (ref 19–32)
Chloride: 106 mEq/L (ref 96–112)
Creatinine, Ser: 0.99 mg/dL (ref 0.50–1.35)
GFR calc non Af Amer: 84 mL/min — ABNORMAL LOW (ref >90)
GLUCOSE: 50 mg/dL — AB (ref 70–99)
POTASSIUM: 3.8 mmol/L (ref 3.5–5.1)
Sodium: 140 mmol/L (ref 135–145)
Total Bilirubin: 0.6 mg/dL (ref 0.3–1.2)
Total Protein: 7.4 g/dL (ref 6.0–8.3)

## 2014-05-04 LAB — URINE MICROSCOPIC-ADD ON

## 2014-05-04 LAB — ETHANOL: ALCOHOL ETHYL (B): 128 mg/dL — AB (ref 0–9)

## 2014-05-04 MED ORDER — MORPHINE SULFATE 4 MG/ML IJ SOLN
4.0000 mg | Freq: Once | INTRAMUSCULAR | Status: AC
  Administered 2014-05-04: 4 mg via INTRAVENOUS
  Filled 2014-05-04: qty 1

## 2014-05-04 MED ORDER — ONDANSETRON HCL 4 MG/2ML IJ SOLN
4.0000 mg | Freq: Once | INTRAMUSCULAR | Status: AC
  Administered 2014-05-04: 4 mg via INTRAVENOUS
  Filled 2014-05-04: qty 2

## 2014-05-04 MED ORDER — POLYETHYLENE GLYCOL 3350 17 G PO PACK: 17.0000 g | PACK | Freq: Every day | ORAL | Status: DC

## 2014-05-04 MED ORDER — PHENAZOPYRIDINE HCL 200 MG PO TABS: 200.0000 mg | ORAL_TABLET | Freq: Three times a day (TID) | ORAL | Status: DC

## 2014-05-04 NOTE — Discharge Instructions (Signed)
Abdominal Pain Many things can cause abdominal pain. Usually, abdominal pain is not caused by a disease and will improve without treatment. It can often be observed and treated at home. Your health care provider will do a physical exam and possibly order blood tests and X-rays to help determine the seriousness of your pain. However, in many cases, more time must pass before a clear cause of the pain can be found. Before that point, your health care provider may not know if you need more testing or further treatment. HOME CARE INSTRUCTIONS  Monitor your abdominal pain for any changes. The following actions may help to alleviate any discomfort you are experiencing:  Only take over-the-counter or prescription medicines as directed by your health care provider.  Do not take laxatives unless directed to do so by your health care provider.  Try a clear liquid diet (broth, tea, or water) as directed by your health care provider. Slowly move to a bland diet as tolerated. SEEK MEDICAL CARE IF:  You have unexplained abdominal pain.  You have abdominal pain associated with nausea or diarrhea.  You have pain when you urinate or have a bowel movement.  You experience abdominal pain that wakes you in the night.  You have abdominal pain that is worsened or improved by eating food.  You have abdominal pain that is worsened with eating fatty foods.  You have a fever. SEEK IMMEDIATE MEDICAL CARE IF:   Your pain does not go away within 2 hours.  You keep throwing up (vomiting).  Your pain is felt only in portions of the abdomen, such as the right side or the left lower portion of the abdomen.  You pass bloody or black tarry stools. MAKE SURE YOU:  Understand these instructions.   Will watch your condition.   Will get help right away if you are not doing well or get worse.  Document Released: 01/28/2005 Document Revised: 04/25/2013 Document Reviewed: 12/28/2012 [facility] Patient Information  2015 [place], [place]. This information is not intended to replace advice given to you by your health care provider. Make sure you discuss any questions you have with your health care provider. Dysuria Dysuria is the medical term for pain with urination. There are many causes for dysuria, but urinary tract infection is the most common. If a urinalysis was performed it can show that there is a urinary tract infection. A urine culture confirms that you or your child is sick. You will need to follow up with a healthcare provider because:  If a urine culture was done you will need to know the culture results and treatment recommendations.  If the urine culture was positive, you or your child will need to be put on antibiotics or know if the antibiotics prescribed are the right antibiotics for your urinary tract infection.  If the urine culture is negative (no urinary tract infection), then other causes may need to be explored or antibiotics need to be stopped. Today laboratory work may have been done and there does not seem to be an infection. If cultures were done they will take at least 24 to 48 hours to be completed. Today x-rays may have been taken and they read as normal. No cause can be found for the problems. The x-rays may be re-read by a radiologist and you will be contacted if additional findings are made. You or your child may have been put on medications to help with this problem until you can see your primary caregiver. If the  problems get better, see your primary caregiver if the problems return. If you were given antibiotics (medications which kill germs), take all of the mediations as directed for the full course of treatment.  If laboratory work was done, you need to find the results. Leave a telephone number where you can be reached. If this is not possible, make sure you find out how you are to get test results. HOME CARE INSTRUCTIONS   Drink lots of fluids. For adults, drink eight, 8  ounce glasses of clear juice or water a day. For children, replace fluids as suggested by your caregiver.  Empty the bladder often. Avoid holding urine for long periods of time.  After a bowel movement, women should cleanse front to back, using each tissue only once.  Empty your bladder before and after sexual intercourse.  Take all the medicine given to you until it is gone. You may feel better in a few days, but TAKE ALL MEDICINE.  Avoid caffeine, tea, alcohol and carbonated beverages, because they tend to irritate the bladder.  In men, alcohol may irritate the prostate.  Only take over-the-counter or prescription medicines for pain, discomfort, or fever as directed by your caregiver.  If your caregiver has given you a follow-up appointment, it is very important to keep that appointment. Not keeping the appointment could result in a chronic or permanent injury, pain, and disability. If there is any problem keeping the appointment, you must call back to this facility for assistance. SEEK IMMEDIATE MEDICAL CARE IF:   Back pain develops.  A fever develops.  There is nausea (feeling sick to your stomach) or vomiting (throwing up).  Problems are no better with medications or are getting worse. MAKE SURE YOU:   Understand these instructions.  Will watch your condition.  Will get help right away if you are not doing well or get worse. Document Released: 01/17/2004 Document Revised: 07/13/2011 Document Reviewed: 11/24/2007 [facility] Patient Information 2015 [place], [place]. This information is not intended to replace advice given to you by your health care provider. Make sure you discuss any questions you have with your health care provider.  Emergency Department Resource Guide 1) Find a Doctor and Pay Out of Pocket Although you won't have to find out who is covered by your insurance plan, it is a good idea to ask around and get recommendations. You will then need to call the office  and see if the doctor you have chosen will accept you as a new patient and what types of options they offer for patients who are self-pay. Some doctors offer discounts or will set up payment plans for their patients who do not have insurance, but you will need to ask so you aren't surprised when you get to your appointment.  2) Contact Your Local Health Department Not all health departments have doctors that can see patients for sick visits, but many do, so it is worth a call to see if yours does. If you don't know where your local health department is, you can check in your phone book. The CDC also has a tool to help you locate your state's health department, and many state websites also have listings of all of their local health departments.  3) Find a [facility] If your illness is not likely to be very severe or complicated, you may want to try a walk in clinic. These are popping up all over the country in pharmacies, drugstores, and shopping centers. They're usually staffed by nurse  practitioners or physician assistants that have been trained to treat common illnesses and complaints. They're usually fairly quick and inexpensive. However, if you have serious medical issues or chronic medical problems, these are probably not your best option.  No Primary Care Doctor: - Call Health Connect at  [phone] - they can help you locate a primary care doctor that  accepts your insurance, provides certain services, etc. - Physician Referral Service- [phone]  Chronic Pain Problems: Organization         Address  Phone   Notes  [facility]  [phone] Patients need to be referred by their primary care doctor.   Medication Assistance: Organization         Address  Phone   Notes  [facility] Medication [facility] [address] [place], [place] [address] [phone] --Must be a resident of [facility] -- Must have NO insurance coverage  whatsoever (no Medicaid/ Medicare, etc.) -- The pt. MUST have a primary care doctor that directs their care regularly and follows them in the community   MedAssist  [phone]   [employer]  [phone]    Agencies that provide inexpensive medical care: Organization         Address  Phone   Notes  [place]  [phone]   [facility]    [phone]   [facility] [place], [place] [address] [phone]   [place] [address], [place] [phone]   Planned Parenthood    [phone]   [facility]    [phone]   [place] and [address], [place] Phone:  [phone], Fax:  [phone] Hours of Operation:  9 am - 6 pm, M-F.  Also accepts Medicaid/Medicare and self-pay.  [facility] for [place], [address], [place] Phone: [phone], Fax: [phone]. Hours of Operation:  8:30 am - 5:30 pm, M-F.  Also accepts Medicaid and self-pay.  [facility] [place] [address], [place] Phone: [phone]   [place], [place], [place] [phone], Ext. [phone] Mondays & Thursdays: 7-9 AM.  First 15 patients are seen on a first come, first serve basis.    [place] Providers:  Organization         Address  Phone   Notes  [facility] [address], [place] [phone] Also accepts self-pay patients.  [facility] [address], [place]  [phone]   [place], [address], [place] [phone]   [facility] [address] [place] [phone]   Lucianne Lei [address], [place]   [phone] Only accepts [place] Access [place] patients after they have their name applied  to their card.   Self-Pay (no insurance) in [facility]:  Organization         Address  Phone   Notes  Sickle Cell Patients, [facility] [place] [phone]   [facility]gent Care [place] [phone]   [facility]gent [place]  [place], [address], [place] [phone]   [facility]/Dr. Vista Lawman  [address] [place] or [place], [address], [place] [phone] Phone number for both [place] and [place] locations is the same.  Urgent Medical and [facility] [address], [place] [phone]   [facility] [address], [place] or [address] [phone]   [facility] [address] [place] [phone], phone; [phone], fax Sees patients 1st and 3rd Saturday of every month.  Must not qualify for public or private insurance (i.e. Medicaid, Medicare, [place] Health Choice, Veterans' Benefits)  Household income should be no more than 200% of the poverty level The clinic cannot treat you if you are pregnant or think you are pregnant  Sexually transmitted diseases are not treated at the clinic.    Dental Care: Organization         Address  Phone  Notes  [facility] of [facility] [place] [phone] Accepts children up to age 6 who are enrolled in [place] or [place]; pregnant women with a Medicaid card; and children who have applied for Medicaid or [place] Health Choice, but were declined, whose parents can pay a reduced fee at time of service.  [facility] Department of [facility]  [address], [place] [phone] Accepts children up to age 1 who are enrolled in [place] or [place]; pregnant women with a Medicaid card; and children who have applied for Medicaid or [place]  Health Choice, but were declined, whose parents can pay a reduced fee at time of service.  [place] Adult Dental Access PROGRAM  [place] [phone] Patients are seen by appointment only. Walk-ins are not accepted. [place] will see patients 69 years of age and older. Monday - Tuesday (8am-5pm) Most Wednesdays (8:30-5pm) $30 per visit, cash only  [facility] Adult Dental Access PROGRAM  [address], [facility] [phone] Patients are seen by appointment only. Walk-ins are not accepted. [place] will see patients 14 years of age and older. One Wednesday Evening (Monthly: Volunteer Based).  $30 per visit, cash only  [place]  [phone] for adults; Children under age 42, call Graduate Pediatric Dentistry at [phone]. Children aged 64-14, please call [phone] to request a pediatric application.  Dental services are provided in all areas of dental care including fillings, crowns and bridges, complete and partial dentures, implants, gum treatment, root canals, and extractions. Preventive care is also provided. Treatment is provided to both adults and children. Patients are selected via a lottery and there is often a waiting list.   [facility] [address], [place]  [phone] [email or web address]   [facility] [address] [place], [place] [phone], Ext. [phone] Second and Fourth Thursday of each month, opens at 6:30 AM; Clinic ends at 9 AM.  Patients are seen on a first-come first-served basis, and a limited number are seen during each clinic.   [facility]  [address] Hillard Danker [place], [place] [phone]   Eligibility Requirements You must have lived in [place], [place], or [place] counties for at least the last three months.   You cannot be eligible for state or federal sponsored [employer], including [employer], [place], or [employer].    You generally cannot be eligible for healthcare insurance through your employer.  How to apply: Eligibility screenings are held every Tuesday and Wednesday afternoon from 1:00 pm until 4:00 pm. You do not need an appointment for the interview!  [facility] [address] [place], [place]   [place]  [place] Department  [place]  [phone]    Behavioral Health Resources in the Community: Intensive Outpatient Programs Organization         Address  Phone  Notes  [place]. [address], [place], [place] [phone]   [facility] Outpatient [address] [place], [place]   ADS: Alcohol & Drug Svcs [address], [place], [place]   [place] [address],  [place], [place] or [phone]   Substance Abuse Resources Organization         Address  Phone  Notes  Alcohol and Drug Services  [phone]   [place]  [phone]   [facility] [place]   Chinita Pester  [phone]   Residential & Outpatient Substance Abuse Program  [phone]   Psychological Services Organization         Address  Phone  Notes  [facility] [place]  [place]  [phone]   [place] [address] [place] or [phone]    Mobile Crisis Teams Organization         Address  Phone  Notes  [facility], Mobile Crisis Care Unit  [phone]   [facility]  [address] [place], [place]   [place] [address] [place] [phone]    Self-Help/Support Groups Organization         Address  Phone             Notes  [place]. of [place] - variety of support groups  [place] Call  for more information  Narcotics Anonymous (NA), [facility] [address], [employer] [place]  2 meetings at this location   Special educational needs teacher         Address  Phone  Notes  ASAP Residential Treatment Veteran,    [place]  [phone]   [facility]  [address], [place] [address], [place], [place]   [place], [place] [phone] Admissions: 8am-3pm M-F  Incentives Substance [place] [address]    [place], [place] [phone]   [facility] [address] [place], [place], [place]   [facility] [address],  [place], [place]   [facility] Programs - Intensive Outpatient [address] Kristeen [address], [place], [place]   [facility] ([place] [place].,  [place], [place] [phone] or [phone]   [facility] Services (RTS) [address] [place], [place] Accepts Medicaid  [facility] [place] [address],  [place] [phone] Substance Abuse/Addiction Treatment   [facility] Organization         Address  Phone  Notes  [facility]  [phone]   Domenic Schwab, PhD [address] Arlis [place], [place]   [phone] or [phone]   [place], [place] [phone]   [facility] [address],  [place], [place] [phone] Insurance/Medicaid/sponsorship through [facility] and Families [address], [place], [place] [phone] [place] [address]   [place], [place] [phone]    Dr. Adele Schilder  [phone]   [facility] Dept. 1) [address]  2) [place] 3)  [place] [address], [place]  [phone]  [phone]   [place] [phone] or [phone] (After Hours)

## 2014-05-04 NOTE — ED Provider Notes (Signed)
CSN: [id number]     Arrival date & time 05/04/14  1458 History   First MD Initiated Contact with Patient 05/04/14 1520     Chief Complaint  Patient presents with  . Groin Pain  . Hematuria     (Consider location/radiation/quality/duration/timing/severity/associated sxs/prior Treatment) HPI The patient reports he has a history of prostate cancer. He reports he has been having problems with pain with urination and blood in the urine. He reports he's also experiencing lower back pain, he endorses pain bilaterally. No difficulty walking. No pain radiation into the legs. He does report he has a element of chronic back pain. The patient reports that this has been worsening over the past few days. He also reports constipation and straining at stool. Past Medical History  Diagnosis Date  . Hypertension   . COPD (chronic obstructive pulmonary disease)   . Emphysema   . Enlarged prostate   . Degenerative arthritis of spine   . Back pain, chronic   . Diverticulitis   . Diabetes mellitus without complication   . Anal fissure   . Prostate cancer 11/2013  . Heart murmur   . Nocturia    Past Surgical History  Procedure Laterality Date  . Lumbar disc surgery  2007    L4-L5  . Inguinal hernia repair Right years ago  . Robot assisted laparoscopic radical prostatectomy N/A 03/16/2014    Procedure: ROBOTIC ASSISTED LAPAROSCOPIC RADICAL PROSTATECTOMY;  Surgeon: Ardis Hughs, MD;  Location: [facility];  Service: Urology;  Laterality: N/A;  . Lymphadenectomy Bilateral 03/16/2014    Procedure: LYMPHADENECTOMY;  Surgeon: Ardis Hughs, MD;  Location: [facility];  Service: Urology;  Laterality: Bilateral;   Family History  Problem Relation Age of Onset  . Colon cancer Sister   . Prostate cancer Father   . Diabetes Mother   . Heart failure Mother   . Cancer Mother     cervical   History  Substance Use Topics  . Smoking status: Former Smoker -- 1.00 packs/day for 20 years    Quit date:  01/16/2014  . Smokeless tobacco: Never Used  . Alcohol Use: No    Review of Systems  10 Systems reviewed and are negative for acute change except as noted in the HPI.   Allergies  Vicodin  Home Medications   Prior to Admission medications   Medication Sig Start Date End Date Taking? Authorizing Provider  albuterol (PROVENTIL HFA;VENTOLIN HFA) 108 (90 BASE) MCG/ACT inhaler Inhale 2 puffs into the lungs every 6 (six) hours as needed for wheezing or shortness of breath (wheezing).    Yes Historical Provider, MD  ALPRAZolam Duanne Moron) 1 MG tablet Take 1 mg by mouth 3 (three) times daily as needed for anxiety (anxiety).    Yes Historical Provider, MD  aspirin EC 81 MG tablet Take 81 mg by mouth every morning.    Yes Historical Provider, MD  fluticasone-salmeterol (ADVAIR HFA) 115-21 MCG/ACT inhaler Inhale 2 puffs into the lungs 2 (two) times daily.   Yes Historical Provider, MD  lisinopril-hydrochlorothiazide (PRINZIDE,ZESTORETIC) 20-12.5 MG per tablet Take 1 tablet by mouth every morning.    Yes Historical Provider, MD  metFORMIN (GLUCOPHAGE) 500 MG tablet Take 500 mg by mouth daily with breakfast.   Yes Historical Provider, MD  ondansetron (ZOFRAN) 4 MG tablet Take 1 tablet (4 mg total) by mouth every 8 (eight) hours as needed for nausea or vomiting. 03/19/14  Yes Ardis Hughs, MD  oxyCODONE-acetaminophen (PERCOCET) 7.5-325 MG per tablet Take 1-2  tablets by mouth every 6 (six) hours as needed for pain. 03/19/14  Yes Ardis Hughs, MD  traMADol (ULTRAM) 50 MG tablet Take 1 tablet (50 mg total) by mouth every 6 (six) hours as needed. 05/02/14  Yes Noland Fordyce, PA-C  zolpidem (AMBIEN) 5 MG tablet Take 5 mg by mouth at bedtime as needed for sleep (sleep).    Yes Historical Provider, MD  cyclobenzaprine (FLEXERIL) 5 MG tablet Take 1 tablet (5 mg total) by mouth 3 (three) times daily as needed for muscle spasms. 05/02/14   Noland Fordyce, PA-C  docusate sodium (COLACE) 100 MG capsule Take  1 capsule (100 mg total) by mouth 2 (two) times daily as needed (take to keep stool soft.). 03/19/14   Ardis Hughs, MD  phenazopyridine (PYRIDIUM) 200 MG tablet Take 1 tablet (200 mg total) by mouth 3 (three) times daily. 05/04/14   Charlesetta Shanks, MD  polyethylene glycol (MIRALAX / GLYCOLAX) packet Take 17 g by mouth daily. 05/04/14   Charlesetta Shanks, MD  predniSONE (DELTASONE) 20 MG tablet 3 tabs po day one, then 2 po daily x 4 days Patient not taking: Reported on 05/04/2014 05/02/14   Noland Fordyce, PA-C   BP 143/86 mmHg  Pulse 101  Temp(Src) 97.5 F (36.4 C) (Oral)  Resp 18  SpO2 100% Physical Exam  Constitutional: He is oriented to person, place, and time. He appears well-developed and well-nourished.  HENT:  Head: Normocephalic and atraumatic.  Eyes: EOM are normal. Pupils are equal, round, and reactive to light.  Neck: Neck supple.  Cardiovascular: Normal rate, regular rhythm, normal heart sounds and intact distal pulses.   Pulmonary/Chest: Effort normal and breath sounds normal.  Abdominal: Soft. Bowel sounds are normal. He exhibits no distension. There is tenderness (Patient endorses lower abdominal tenderness bilaterally. No lateralizing, no guarding, no mass.). There is no guarding.  Patient endorses bilateral CVA tenderness to percussion.  Musculoskeletal: Normal range of motion. He exhibits no edema.  Neurological: He is alert and oriented to person, place, and time. He has normal strength. Coordination normal. GCS eye subscore is 4. GCS verbal subscore is 5. GCS motor subscore is 6.  Skin: Skin is warm, dry and intact.  Psychiatric: He has a normal mood and affect.    ED Course  Procedures (including critical care time) Labs Review Labs Reviewed  URINALYSIS, ROUTINE W REFLEX MICROSCOPIC - Abnormal; Notable for the following:    Hgb urine dipstick TRACE (*)    Ketones, ur 15 (*)    All other components within normal limits  COMPREHENSIVE METABOLIC PANEL - Abnormal;  Notable for the following:    CO2 17 (*)    Glucose, Bld 50 (*)    GFR calc non Af Amer 84 (*)    Anion gap 17 (*)    All other components within normal limits  ETHANOL - Abnormal; Notable for the following:    Alcohol, Ethyl (B) 128 (*)    All other components within normal limits  CBC WITH DIFFERENTIAL - Abnormal; Notable for the following:    WBC 11.9 (*)    Neutro Abs 8.8 (*)    Monocytes Absolute 1.1 (*)    All other components within normal limits  LIPASE, BLOOD  URINE MICROSCOPIC-ADD ON    Imaging Review Ct Renal Stone Study  05/04/2014   CLINICAL DATA:  Groin pain.  Hematuria.  History of prostatectomy.  EXAM: CT ABDOMEN AND PELVIS WITHOUT CONTRAST  TECHNIQUE: Multidetector CT imaging of the abdomen and  pelvis was performed following the standard protocol without IV contrast.  COMPARISON:  11/26/2013.  FINDINGS: Musculoskeletal: No aggressive osseous lesions. Os acetabulum is present in the LEFT hip. Moderate RIGHT hip osteoarthritis. Mild LEFT hip osteoarthritis.  Lung Bases: Atelectasis.  Liver: Unenhanced CT was performed per clinician order. Lack of IV contrast limits sensitivity and specificity, especially for evaluation of abdominal/pelvic solid viscera. Diffuse low-attenuation the liver consistent with hepatosteatosis. No mass lesion or intrahepatic biliary ductal dilation.  Spleen:  Normal.  Gallbladder:  Normal.  Common bile duct:  Normal.  Pancreas:  Normal.  Adrenal glands:  Normal bilaterally.  Kidneys: LEFT kidney appears normal. No urolithiasis. LEFT ureter is normal. RIGHT ureter is also normal. RIGHT kidney is also within normal limits. No renal calculi.  Stomach:  Partially collapsed.  No inflammatory changes.  Small bowel: Duodenum is normal. Small bowel appears within normal limits. No inflammatory changes or obstruction.  Colon: Normal appendix. Colonic diverticulosis. No colonic inflammatory changes. Negative for diverticulitis.  Pelvic Genitourinary: Urinary bladder  appears normal. Prostatectomy.  Peritoneum: No free fluid or free air.  Vasculature: Atherosclerosis.  Body Wall: Fat containing RIGHT inguinal hernia.  IMPRESSION: 1. No acute abnormality. 2. Fatty liver. 3. Negative for urolithiasis. 4. Prostatectomy. 5. Fat containing RIGHT inguinal hernia.   Electronically Signed   By: Dereck Ligas M.D.   On: 05/04/2014 15:48     EKG Interpretation None      MDM   Final diagnoses:  Constipation, unspecified constipation type  Dysuria  H/O prostate cancer   At this point there are no acute findings for the patient's dysuria and lower abdominal discomfort. The patient does report blood in the urine however currently there is no significant hematuria. A urine culture will be obtained. CT does not show evidence of pyelonephritis or stone. Patient does describe straining at stool and will be treated with mural ask for symptoms of constipation which may have associated urinary symptoms. The patient also describes some chronic musculoskeletal type back pain which is a likely explanation for the back pain component he is describing. The patient does have some acute alcohol consumption however his mental status is clear and alert.    Charlesetta Shanks, MD 05/04/14 [phone]

## 2014-05-04 NOTE — ED Notes (Signed)
Pt c/o groin pain and blood in urine. Had prostate removed. ETOH. CBG 64. AAO x4.

## 2014-05-04 NOTE — ED Notes (Signed)
Pt is unable to void at this time.  

## 2014-05-04 NOTE — ED Notes (Signed)
Bed: JE56 Expected date:  Expected time:  Means of arrival:  Comments: Prostate Ca, pain

## 2014-05-06 LAB — URINE CULTURE
COLONY COUNT: NO GROWTH
Culture: NO GROWTH

## 2014-05-26 ENCOUNTER — Emergency Department (HOSPITAL_COMMUNITY): Payer: Medicare Other

## 2014-05-26 ENCOUNTER — Emergency Department (HOSPITAL_COMMUNITY)
Admission: EM | Admit: 2014-05-26 | Discharge: 2014-05-26 | Disposition: A | Discharge status: Home / Self Care | Payer: Medicare Other | Attending: Emergency Medicine | Admitting: Emergency Medicine

## 2014-05-26 ENCOUNTER — Encounter (HOSPITAL_COMMUNITY): Payer: Self-pay

## 2014-05-26 DIAGNOSIS — Z87891 Personal history of nicotine dependence: Secondary | ICD-10-CM | POA: Diagnosis not present

## 2014-05-26 DIAGNOSIS — S3991XA Unspecified injury of abdomen, initial encounter: Secondary | ICD-10-CM | POA: Diagnosis not present

## 2014-05-26 DIAGNOSIS — Z8546 Personal history of malignant neoplasm of prostate: Secondary | ICD-10-CM | POA: Diagnosis not present

## 2014-05-26 DIAGNOSIS — Z79899 Other long term (current) drug therapy: Secondary | ICD-10-CM | POA: Diagnosis not present

## 2014-05-26 DIAGNOSIS — Y9301 Activity, walking, marching and hiking: Secondary | ICD-10-CM | POA: Insufficient documentation

## 2014-05-26 DIAGNOSIS — Z8719 Personal history of other diseases of the digestive system: Secondary | ICD-10-CM | POA: Insufficient documentation

## 2014-05-26 DIAGNOSIS — R011 Cardiac murmur, unspecified: Secondary | ICD-10-CM | POA: Insufficient documentation

## 2014-05-26 DIAGNOSIS — W010XXA Fall on same level from slipping, tripping and stumbling without subsequent striking against object, initial encounter: Secondary | ICD-10-CM | POA: Insufficient documentation

## 2014-05-26 DIAGNOSIS — S3992XA Unspecified injury of lower back, initial encounter: Secondary | ICD-10-CM | POA: Insufficient documentation

## 2014-05-26 DIAGNOSIS — J449 Chronic obstructive pulmonary disease, unspecified: Secondary | ICD-10-CM | POA: Insufficient documentation

## 2014-05-26 DIAGNOSIS — Y9289 Other specified places as the place of occurrence of the external cause: Secondary | ICD-10-CM | POA: Diagnosis not present

## 2014-05-26 DIAGNOSIS — Z7982 Long term (current) use of aspirin: Secondary | ICD-10-CM | POA: Diagnosis not present

## 2014-05-26 DIAGNOSIS — I10 Essential (primary) hypertension: Secondary | ICD-10-CM | POA: Insufficient documentation

## 2014-05-26 DIAGNOSIS — G8929 Other chronic pain: Secondary | ICD-10-CM | POA: Insufficient documentation

## 2014-05-26 DIAGNOSIS — E119 Type 2 diabetes mellitus without complications: Secondary | ICD-10-CM | POA: Insufficient documentation

## 2014-05-26 DIAGNOSIS — Z8739 Personal history of other diseases of the musculoskeletal system and connective tissue: Secondary | ICD-10-CM | POA: Diagnosis not present

## 2014-05-26 DIAGNOSIS — Y998 Other external cause status: Secondary | ICD-10-CM | POA: Insufficient documentation

## 2014-05-26 DIAGNOSIS — M545 Low back pain, unspecified: Secondary | ICD-10-CM

## 2014-05-26 DIAGNOSIS — W19XXXA Unspecified fall, initial encounter: Secondary | ICD-10-CM

## 2014-05-26 LAB — LIPASE, BLOOD: Lipase: 50 U/L (ref 11–59)

## 2014-05-26 LAB — CBC WITH DIFFERENTIAL/PLATELET
Basophils Absolute: 0 10*3/uL (ref 0.0–0.1)
Basophils Relative: 1 % (ref 0–1)
Eosinophils Absolute: 0.1 10*3/uL (ref 0.0–0.7)
Eosinophils Relative: 2 % (ref 0–5)
HEMATOCRIT: 40.7 % (ref 39.0–52.0)
Hemoglobin: 14.3 g/dL (ref 13.0–17.0)
Lymphocytes Relative: 22 % (ref 12–46)
Lymphs Abs: 1.9 10*3/uL (ref 0.7–4.0)
MCH: 30.6 pg (ref 26.0–34.0)
MCHC: 35.1 g/dL (ref 30.0–36.0)
MCV: 87.2 fL (ref 78.0–100.0)
MONOS PCT: 8 % (ref 3–12)
Monocytes Absolute: 0.6 10*3/uL (ref 0.1–1.0)
NEUTROS PCT: 67 % (ref 43–77)
Neutro Abs: 5.8 10*3/uL (ref 1.7–7.7)
Platelets: 286 10*3/uL (ref 150–400)
RBC: 4.67 MIL/uL (ref 4.22–5.81)
RDW: 14.9 % (ref 11.5–15.5)
WBC: 8.4 10*3/uL (ref 4.0–10.5)

## 2014-05-26 LAB — URINALYSIS, ROUTINE W REFLEX MICROSCOPIC
Bilirubin Urine: NEGATIVE
Glucose, UA: NEGATIVE mg/dL
HGB URINE DIPSTICK: NEGATIVE
KETONES UR: NEGATIVE mg/dL
Leukocytes, UA: NEGATIVE
NITRITE: NEGATIVE
Protein, ur: NEGATIVE mg/dL
Specific Gravity, Urine: 1.021 (ref 1.005–1.030)
Urobilinogen, UA: 1 mg/dL (ref 0.0–1.0)
pH: 5.5 (ref 5.0–8.0)

## 2014-05-26 LAB — COMPREHENSIVE METABOLIC PANEL
ALK PHOS: 83 U/L (ref 39–117)
ALT: 12 U/L (ref 0–53)
AST: 19 U/L (ref 0–37)
Albumin: 3.8 g/dL (ref 3.5–5.2)
Anion gap: 12 (ref 5–15)
BILIRUBIN TOTAL: 0.5 mg/dL (ref 0.3–1.2)
BUN: 17 mg/dL (ref 6–23)
CHLORIDE: 104 mmol/L (ref 96–112)
CO2: 21 mmol/L (ref 19–32)
Calcium: 9 mg/dL (ref 8.4–10.5)
Creatinine, Ser: 1.25 mg/dL (ref 0.50–1.35)
GFR calc Af Amer: 68 mL/min — ABNORMAL LOW (ref >90)
GFR calc non Af Amer: 59 mL/min — ABNORMAL LOW (ref >90)
GLUCOSE: 93 mg/dL (ref 70–99)
Potassium: 3.6 mmol/L (ref 3.5–5.1)
SODIUM: 137 mmol/L (ref 135–145)
Total Protein: 6.8 g/dL (ref 6.0–8.3)

## 2014-05-26 MED ORDER — OXYCODONE-ACETAMINOPHEN 5-325 MG PO TABS: 2.0000 | ORAL_TABLET | Freq: Once | ORAL | Status: DC

## 2014-05-26 MED ORDER — OXYCODONE-ACETAMINOPHEN 5-325 MG PO TABS
1.0000 | ORAL_TABLET | Freq: Four times a day (QID) | ORAL | Status: DC | PRN
Start: 2014-05-26 — End: 2015-01-06

## 2014-05-26 MED ORDER — FENTANYL CITRATE 0.05 MG/ML IJ SOLN
100.0000 ug | Freq: Once | INTRAMUSCULAR | Status: DC
  Filled 2014-05-26: qty 2

## 2014-05-26 MED ORDER — OXYCODONE-ACETAMINOPHEN 5-325 MG PO TABS
2.0000 | ORAL_TABLET | Freq: Once | ORAL | Status: AC
  Administered 2014-05-26: 2 via ORAL
  Filled 2014-05-26: qty 2

## 2014-05-26 MED ORDER — SODIUM CHLORIDE 0.9 % IV BOLUS (SEPSIS): 1000.0000 mL | Freq: Once | INTRAVENOUS | Status: DC

## 2014-05-26 NOTE — ED Notes (Signed)
Pt verbalized understanding of discharge instructions and has no further questions.

## 2014-05-26 NOTE — Discharge Instructions (Signed)
Back Pain, Adult Low back pain is very common. About 1 in 5 people have back pain.The cause of low back pain is rarely dangerous. The pain often gets better over time.About half of people with a sudden onset of back pain feel better in just 2 weeks. About 8 in 10 people feel better by 6 weeks.  CAUSES Some common causes of back pain include:  Strain of the muscles or ligaments supporting the spine.  Wear and tear (degeneration) of the spinal discs.  Arthritis.  Direct injury to the back. DIAGNOSIS Most of the time, the direct cause of low back pain is not known.However, back pain can be treated effectively even when the exact cause of the pain is unknown.Answering your caregiver's questions about your overall health and symptoms is one of the most accurate ways to make sure the cause of your pain is not dangerous. If your caregiver needs more information, he or she may order lab work or imaging tests (X-rays or MRIs).However, even if imaging tests show changes in your back, this usually does not require surgery. HOME CARE INSTRUCTIONS For many people, back pain returns.Since low back pain is rarely dangerous, it is often a condition that people can learn to manageon their own.   Remain active. It is stressful on the back to sit or stand in one place. Do not sit, drive, or stand in one place for more than 30 minutes at a time. Take short walks on level surfaces as soon as pain allows.Try to increase the length of time you walk each day.  Do not stay in bed.Resting more than 1 or 2 days can delay your recovery.  Do not avoid exercise or work.Your body is made to move.It is not dangerous to be active, even though your back may hurt.Your back will likely heal faster if you return to being active before your pain is gone.  Pay attention to your body when you bend and lift. Many people have less discomfortwhen lifting if they bend their knees, keep the load close to their bodies,and  avoid twisting. Often, the most comfortable positions are those that put less stress on your recovering back.  Find a comfortable position to sleep. Use a firm mattress and lie on your side with your knees slightly bent. If you lie on your back, put a pillow under your knees.  Only take over-the-counter or prescription medicines as directed by your caregiver. Over-the-counter medicines to reduce pain and inflammation are often the most helpful.Your caregiver may prescribe muscle relaxant drugs.These medicines help dull your pain so you can more quickly return to your normal activities and healthy exercise.  Put ice on the injured area.  Put ice in a plastic bag.  Place a towel between your skin and the bag.  Leave the ice on for 15-20 minutes, 03-04 times a day for the first 2 to 3 days. After that, ice and heat may be alternated to reduce pain and spasms.  Ask your caregiver about trying back exercises and gentle massage. This may be of some benefit.  Avoid feeling anxious or stressed.Stress increases muscle tension and can worsen back pain.It is important to recognize when you are anxious or stressed and learn ways to manage it.Exercise is a great option. SEEK MEDICAL CARE IF:  You have pain that is not relieved with rest or medicine.  You have pain that does not improve in 1 week.  You have new symptoms.  You are generally not feeling well. SEEK   IMMEDIATE MEDICAL CARE IF:   You have pain that radiates from your back into your legs.  You develop new bowel or bladder control problems.  You have unusual weakness or numbness in your arms or legs.  You develop nausea or vomiting.  You develop abdominal pain.  You feel faint. Document Released: 04/20/2005 Document Revised: 10/20/2011 Document Reviewed: 08/22/2013 Exit[employer] Patient Information 2015 [employer]. This information is not intended to replace advice given to you by your health care provider. Make sure you  discuss any questions you have with your health care provider.  

## 2014-05-26 NOTE — ED Provider Notes (Signed)
CSN: [id number]     Arrival date & time 05/26/14  1144 History   First MD Initiated Contact with Patient 05/26/14 1159     Chief Complaint  Patient presents with  . Fall  . Back Pain   HPI  Patient is a 64 year old male with past medical history of hypertension, COPD, emphysema, prostate cancer, chronic back pain, diverticulitis, diabetes mellitus, and nocturia who presents emergency room for evaluation of back pain after a fall. Patient also complains of generalized abdominal pain. Patient states that he got up this morning and fell twice while walking in the snow. He states that he fell on his back both times. He denies hitting his head. He denies loss of consciousness. He denies use of blood thinners. Patient states that since falling he now has tingling and numbness to the bilateral legs. He also admits to some tingling and numbness in the genital region. He is unsure whether he is loss of bowel or bladder since the falls as he is wearing depends. He states that he never has good control of his bowel or bladder. Patient also complains of abdominal pain. He states that he has had this abdominal pain since having his prostate removed. He states he had surgery recently. Patient admits to a remote history of L4-L5 microdiscectomy. Patient states that he has not been taking any pain medication at home.  Past Medical History  Diagnosis Date  . Hypertension   . COPD (chronic obstructive pulmonary disease)   . Emphysema   . Enlarged prostate   . Degenerative arthritis of spine   . Back pain, chronic   . Diverticulitis   . Diabetes mellitus without complication   . Anal fissure   . Prostate cancer 11/2013  . Heart murmur   . Nocturia    Past Surgical History  Procedure Laterality Date  . Lumbar disc surgery  2007    L4-L5  . Inguinal hernia repair Right years ago  . Robot assisted laparoscopic radical prostatectomy N/A 03/16/2014    Procedure: ROBOTIC ASSISTED LAPAROSCOPIC RADICAL  PROSTATECTOMY;  Surgeon: Ardis Hughs, MD;  Location: [facility];  Service: Urology;  Laterality: N/A;  . Lymphadenectomy Bilateral 03/16/2014    Procedure: LYMPHADENECTOMY;  Surgeon: Ardis Hughs, MD;  Location: [facility];  Service: Urology;  Laterality: Bilateral;   Family History  Problem Relation Age of Onset  . Colon cancer Sister   . Prostate cancer Father   . Diabetes Mother   . Heart failure Mother   . Cancer Mother     cervical   History  Substance Use Topics  . Smoking status: Former Smoker -- 1.00 packs/day for 20 years    Quit date: 01/16/2014  . Smokeless tobacco: Never Used  . Alcohol Use: No    Review of Systems  Constitutional: Negative for fever, chills and fatigue.  Respiratory: Negative for chest tightness and shortness of breath.   Cardiovascular: Negative for chest pain and palpitations.  Gastrointestinal: Positive for abdominal pain. Negative for nausea, vomiting, diarrhea and constipation.  Genitourinary: Negative for dysuria, urgency, frequency, hematuria and difficulty urinating.  Musculoskeletal: Positive for back pain. Negative for gait problem.  Neurological: Positive for numbness.      Allergies  Vicodin  Home Medications   Prior to Admission medications   Medication Sig Start Date End Date Taking? Authorizing Provider  albuterol (PROVENTIL HFA;VENTOLIN HFA) 108 (90 BASE) MCG/ACT inhaler Inhale 2 puffs into the lungs every 6 (six) hours as needed for wheezing or shortness  of breath (wheezing).     Historical Provider, MD  ALPRAZolam Duanne Moron) 1 MG tablet Take 1 mg by mouth 3 (three) times daily as needed for anxiety (anxiety).     Historical Provider, MD  aspirin EC 81 MG tablet Take 81 mg by mouth every morning.     Historical Provider, MD  cyclobenzaprine (FLEXERIL) 5 MG tablet Take 1 tablet (5 mg total) by mouth 3 (three) times daily as needed for muscle spasms. 05/02/14   Noland Fordyce, PA-C  docusate sodium (COLACE) 100 MG capsule Take  1 capsule (100 mg total) by mouth 2 (two) times daily as needed (take to keep stool soft.). 03/19/14   Ardis Hughs, MD  fluticasone-salmeterol (ADVAIR HFA) [phone] MCG/ACT inhaler Inhale 2 puffs into the lungs 2 (two) times daily.    Historical Provider, MD  lisinopril-hydrochlorothiazide (PRINZIDE,ZESTORETIC) 20-12.5 MG per tablet Take 1 tablet by mouth every morning.     Historical Provider, MD  metFORMIN (GLUCOPHAGE) 500 MG tablet Take 500 mg by mouth daily with breakfast.    Historical Provider, MD  ondansetron (ZOFRAN) 4 MG tablet Take 1 tablet (4 mg total) by mouth every 8 (eight) hours as needed for nausea or vomiting. 03/19/14   Ardis Hughs, MD  oxyCODONE-acetaminophen (PERCOCET) 5-325 MG per tablet Take 1 tablet by mouth every 6 (six) hours as needed. 05/26/14   Wandra Arthurs, MD  phenazopyridine (PYRIDIUM) 200 MG tablet Take 1 tablet (200 mg total) by mouth 3 (three) times daily. 05/04/14   Charlesetta Shanks, MD  polyethylene glycol (MIRALAX / GLYCOLAX) packet Take 17 g by mouth daily. 05/04/14   Charlesetta Shanks, MD  predniSONE (DELTASONE) 20 MG tablet 3 tabs po day one, then 2 po daily x 4 days Patient not taking: Reported on 05/04/2014 05/02/14   Noland Fordyce, PA-C  traMADol (ULTRAM) 50 MG tablet Take 1 tablet (50 mg total) by mouth every 6 (six) hours as needed. 05/02/14   Noland Fordyce, PA-C  zolpidem (AMBIEN) 5 MG tablet Take 5 mg by mouth at bedtime as needed for sleep (sleep).     Historical Provider, MD   BP 97/75 mmHg  Pulse 111  Temp(Src) 97.7 F (36.5 C) (Oral)  Resp 15  SpO2 96% Physical Exam  Constitutional: He is oriented to person, place, and time. He appears well-developed and well-nourished. No distress.  HENT:  Head: Normocephalic and atraumatic.  Mouth/Throat: Oropharynx is clear and moist. No oropharyngeal exudate.  Eyes: Conjunctivae and EOM are normal. Pupils are equal, round, and reactive to light. No scleral icterus.  Neck: Normal range of motion. Neck  supple. No JVD present. No thyromegaly present.  Cardiovascular: Normal rate, regular rhythm, normal heart sounds and intact distal pulses.  Exam reveals no gallop and no friction rub.   No murmur heard. Pulmonary/Chest: Effort normal and breath sounds normal. No respiratory distress. He has no wheezes. He has no rales. He exhibits no tenderness.  Abdominal: Soft. Normal appearance and bowel sounds are normal. He exhibits no distension and no mass. There is generalized tenderness. There is no rigidity, no rebound, no guarding, no tenderness at McBurney's point and negative Murphy's sign.  Multiple well-healed surgical scars from a laparoscopic surgery.  Musculoskeletal: Normal range of motion.  Patient rises slowly from sitting to standing.  They walk without an antalgic gait.  There is no evidence of erythema, ecchymosis, or gross deformity. There is a well-healed midline surgical scar in the lumbar spine  There is tenderness to palpation over  bony lumbar spine, and bilateral thoracic and lumbar paraspinal muscles..  Active ROM is limited due to pain.  Sensation to light touch is intact over all extremities.  Strength is symmetric and equal in all extremities.      Lymphadenopathy:    He has no cervical adenopathy.  Neurological: He is alert and oriented to person, place, and time. He has normal strength. No cranial nerve deficit or sensory deficit.  Skin: Skin is warm and dry. He is not diaphoretic.  Psychiatric: He has a normal mood and affect. His behavior is normal. Judgment and thought content normal.  Nursing note and vitals reviewed.   ED Course  Procedures (including critical care time) Labs Review Labs Reviewed  COMPREHENSIVE METABOLIC PANEL - Abnormal; Notable for the following:    GFR calc non Af Amer 59 (*)    GFR calc Af Amer 68 (*)    All other components within normal limits  URINALYSIS, ROUTINE W REFLEX MICROSCOPIC  CBC WITH DIFFERENTIAL/PLATELET  LIPASE, BLOOD     Imaging Review Dg Thoracic Spine 2 View  05/26/2014   CLINICAL DATA:  Low back pain post fall on ice today  EXAM: THORACIC SPINE - 2 VIEW  COMPARISON:  None.  FINDINGS: Three views of thoracic spine submitted. No acute fracture or subluxation. Alignment and vertebral body heights are preserved. Minimal degenerative changes with anterior and lateral spurring lower thoracic spine.  IMPRESSION: No acute fracture or subluxation. Minimal degenerative changes lower thoracic spine.   Electronically Signed   By: Lahoma Crocker M.D.   On: 05/26/2014 14:31   Dg Lumbar Spine Complete  05/26/2014   CLINICAL DATA:  Low back pain post fall on ice today  EXAM: LUMBAR SPINE - COMPLETE 4+ VIEW  COMPARISON:  05/04/2014 sagittal images of the spine  FINDINGS: Five views of lumbar spine submitted. No acute fracture or subluxation. Alignment, disc spaces and vertebral body heights are preserved. Minimal anterior spurring upper endplate of L4 and L5 vertebral bodies. Mild facet degenerative changes L4 and L5 level.  IMPRESSION: No acute fracture or subluxation.  Mild degenerative changes.   Electronically Signed   By: Lahoma Crocker M.D.   On: 05/26/2014 14:32     EKG Interpretation None      MDM   Final diagnoses:  Fall  Midline low back pain without sciatica   Patient is a 64 year old male who presents emergency room for evaluation of back pain after falls and abdominal pain. Physical exam reveals no focal neurological deficits. Patient has a history of inability to control his bowels and bladder.  Patient does not have any red flags at this time for cauda equina. Lumbar and thoracic spine x-rays are negative. CBC, CMP, lipase, and urinalysis are all negative. At this time patient does not require any further workup. We'll discharge home with 10 09/04/2023 Percocets which were written by Dr. Darl Householder. Patient stated that he "lost his previous prescription". Patient to return for intractable pain, intractable nausea and  vomiting, or any other concerning factors. Patient refused IV despite tachycardia. Doubt that this is likely epidural abscess, or cauda equina. Patient discussed and seen by Dr. Darl Householder who agrees with the above workup and plan at this time. Patient stable for discharge.    Cherylann Parr, PA-C 05/26/14 1457  Wandra Arthurs, MD 05/26/14 [phone]

## 2014-05-26 NOTE — ED Notes (Addendum)
Pt refused IV access, MD notified

## 2014-05-26 NOTE — ED Notes (Signed)
Pt stated that he slipped and fell in the snow this morning and fell on his back each time. Pt complaining back, abdomen and leg pain. Pt in no acute distress

## 2014-06-14 ENCOUNTER — Encounter: Payer: Self-pay | Admitting: Podiatrist

## 2014-06-14 ENCOUNTER — Ambulatory Visit (INDEPENDENT_AMBULATORY_CARE_PROVIDER_SITE_OTHER): Payer: Medicare Other | Admitting: Podiatrist

## 2014-06-14 DIAGNOSIS — M216X9 Other acquired deformities of unspecified foot: Secondary | ICD-10-CM

## 2014-06-14 DIAGNOSIS — B351 Tinea unguium: Secondary | ICD-10-CM

## 2014-06-14 DIAGNOSIS — M79606 Pain in leg, unspecified: Secondary | ICD-10-CM | POA: Diagnosis not present

## 2014-06-14 DIAGNOSIS — E1140 Type 2 diabetes mellitus with diabetic neuropathy, unspecified: Secondary | ICD-10-CM

## 2014-06-14 DIAGNOSIS — Q828 Other specified congenital malformations of skin: Secondary | ICD-10-CM

## 2014-06-14 NOTE — Progress Notes (Signed)
Patient presents for follow up of foot and nail care.  Relates pain from the toenails and calluses.   Physical Exam  GENERAL APPEARANCE: Alert, conversant. Appropriately groomed. No acute distress.  VASCULAR: Pedal pulses palpable at 2/4 DP and PT bilateral. Capillary refill time is immediate to all digits, Proximal to distal cooling it warm to warm. Digital hair growth is present bilateral  NEUROLOGIC: sensation is intact epicritically and protectively to 5.07 monofilament at 5/5 sites bilateral. Light touch is intact bilateral, vibratory sensation intact bilateral, achilles tendon reflex is intact bilateral.  MUSCULOSKELETAL: acceptable muscle strength, tone and stability bilateral. Intrinsic muscluature intact bilateral. Rectus appearance of foot and digits noted bilateral.  DERMATOLOGIC: porokeratotic lesions present submet 5 bilateral, left hallux medial side, right 2nd , nails x 10 are elongated, thick, discolored, distrophic and are mycotic   Assessment:  Plan:debridement of nails and lesions, without complication. Patient will return in 3 months or as needed for follow up

## 2014-06-14 NOTE — Patient Instructions (Signed)
Diabetes and Foot Care Diabetes may cause you to have problems because of poor blood supply (circulation) to your feet and legs. This may cause the skin on your feet to become thinner, break easier, and heal more slowly. Your skin may become dry, and the skin may peel and crack. You may also have nerve damage in your legs and feet causing decreased feeling in them. You may not notice minor injuries to your feet that could lead to infections or more serious problems. Taking care of your feet is one of the most important things you can do for yourself.  HOME CARE INSTRUCTIONS  Wear shoes at all times, even in the house. Do not go barefoot. Bare feet are easily injured.  Check your feet daily for blisters, cuts, and redness. If you cannot see the bottom of your feet, use a mirror or ask someone for help.  Wash your feet with warm water (do not use hot water) and mild soap. Then pat your feet and the areas between your toes until they are completely dry. Do not soak your feet as this can dry your skin.  Apply a moisturizing lotion or petroleum jelly (that does not contain alcohol and is unscented) to the skin on your feet and to dry, brittle toenails. Do not apply lotion between your toes.  Trim your toenails straight across. Do not dig under them or around the cuticle. File the edges of your nails with an emery board or nail file.  Do not cut corns or calluses or try to remove them with medicine.  Wear clean socks or stockings every day. Make sure they are not too tight. Do not wear knee-high stockings since they may decrease blood flow to your legs.  Wear shoes that fit properly and have enough cushioning. To break in new shoes, wear them for just a few hours a day. This prevents you from injuring your feet. Always look in your shoes before you put them on to be sure there are no objects inside.  Do not cross your legs. This may decrease the blood flow to your feet.  If you find a minor scrape,  cut, or break in the skin on your feet, keep it and the skin around it clean and dry. These areas may be cleansed with mild soap and water. Do not cleanse the area with peroxide, alcohol, or iodine.  When you remove an adhesive bandage, be sure not to damage the skin around it.  If you have a wound, look at it several times a day to make sure it is healing.  Do not use heating pads or hot water bottles. They may burn your skin. If you have lost feeling in your feet or legs, you may not know it is happening until it is too late.  Make sure your health care provider performs a complete foot exam at least annually or more often if you have foot problems. Report any cuts, sores, or bruises to your health care provider immediately. SEEK MEDICAL CARE IF:   You have an injury that is not healing.  You have cuts or breaks in the skin.  You have an ingrown nail.  You notice redness on your legs or feet.  You feel burning or tingling in your legs or feet.  You have pain or cramps in your legs and feet.  Your legs or feet are numb.  Your feet always feel cold. SEEK IMMEDIATE MEDICAL CARE IF:   There is increasing redness,   swelling, or pain in or around a wound.  There is a red line that goes up your leg.  Pus is coming from a wound.  You develop a fever or as directed by your health care provider.  You notice a bad smell coming from an ulcer or wound. Document Released: 04/17/2000 Document Revised: 12/21/2012 Document Reviewed: 09/27/2012 [employer] Patient Information 2015 [employer]. This information is not intended to replace advice given to you by your health care provider. Make sure you discuss any questions you have with your health care provider.  

## 2014-06-24 ENCOUNTER — Encounter (HOSPITAL_COMMUNITY): Payer: Self-pay | Admitting: Emergency Medicine

## 2014-06-24 ENCOUNTER — Emergency Department (HOSPITAL_COMMUNITY)
Admission: EM | Admit: 2014-06-24 | Discharge: 2014-06-24 | Discharge status: Against Medical Advice | Payer: Medicare Other | Attending: Emergency Medicine | Admitting: Emergency Medicine

## 2014-06-24 DIAGNOSIS — Z7982 Long term (current) use of aspirin: Secondary | ICD-10-CM | POA: Insufficient documentation

## 2014-06-24 DIAGNOSIS — G8929 Other chronic pain: Secondary | ICD-10-CM | POA: Insufficient documentation

## 2014-06-24 DIAGNOSIS — J449 Chronic obstructive pulmonary disease, unspecified: Secondary | ICD-10-CM | POA: Diagnosis not present

## 2014-06-24 DIAGNOSIS — Z8546 Personal history of malignant neoplasm of prostate: Secondary | ICD-10-CM | POA: Insufficient documentation

## 2014-06-24 DIAGNOSIS — R109 Unspecified abdominal pain: Secondary | ICD-10-CM | POA: Diagnosis not present

## 2014-06-24 DIAGNOSIS — I10 Essential (primary) hypertension: Secondary | ICD-10-CM | POA: Diagnosis not present

## 2014-06-24 DIAGNOSIS — R05 Cough: Secondary | ICD-10-CM

## 2014-06-24 DIAGNOSIS — Z79899 Other long term (current) drug therapy: Secondary | ICD-10-CM | POA: Insufficient documentation

## 2014-06-24 DIAGNOSIS — Z7952 Long term (current) use of systemic steroids: Secondary | ICD-10-CM | POA: Diagnosis not present

## 2014-06-24 DIAGNOSIS — Z87891 Personal history of nicotine dependence: Secondary | ICD-10-CM | POA: Insufficient documentation

## 2014-06-24 DIAGNOSIS — E119 Type 2 diabetes mellitus without complications: Secondary | ICD-10-CM | POA: Diagnosis not present

## 2014-06-24 DIAGNOSIS — Z8719 Personal history of other diseases of the digestive system: Secondary | ICD-10-CM | POA: Insufficient documentation

## 2014-06-24 DIAGNOSIS — Z87448 Personal history of other diseases of urinary system: Secondary | ICD-10-CM | POA: Diagnosis not present

## 2014-06-24 DIAGNOSIS — Z7951 Long term (current) use of inhaled steroids: Secondary | ICD-10-CM | POA: Diagnosis not present

## 2014-06-24 DIAGNOSIS — R059 Cough, unspecified: Secondary | ICD-10-CM

## 2014-06-24 DIAGNOSIS — M791 Myalgia: Secondary | ICD-10-CM | POA: Diagnosis not present

## 2014-06-24 DIAGNOSIS — R011 Cardiac murmur, unspecified: Secondary | ICD-10-CM | POA: Diagnosis not present

## 2014-06-24 NOTE — ED Notes (Signed)
Entered pt's room to prep for XRAY, pt eloped, belongings gone. EDP notified.

## 2014-06-24 NOTE — ED Provider Notes (Signed)
CSN: [id number]     Arrival date & time 06/24/14  4627 History  This chart was scribed for Varney Biles, MD by Chester Holstein, ED Scribe. This patient was seen in room A01C/A01C and the patient's care was started at 3:54 AM.    Chief Complaint  Patient presents with  . Cough    Patient is a 64 y.o. male presenting with cough. The history is provided by the patient. No language interpreter was used.  Cough Associated symptoms: myalgias   Associated symptoms: no chest pain and no shortness of breath    HPI Comments: Ryan Hopkins is a 64 y.o. male with PMHx of HTN, COPD, emphysema, enlarged prostate, prostate CA, heart murmur, diverticulitis, anal fissure, and  NIDDM who presents to the Emergency Department complaining of productive cough with onset 4 days ago. Pt notes associated abdominal pain for 2 days and generalized body aches. Pt states "I just feel like I'm coming down with something." Pt is not utd on his flu shot. Pt denies chest pain, SOB, nausea, vomiting, diarrhea, weakness, dysuria, hematuria, and h/o of MI.   Past Medical History  Diagnosis Date  . Hypertension   . COPD (chronic obstructive pulmonary disease)   . Emphysema   . Enlarged prostate   . Degenerative arthritis of spine   . Back pain, chronic   . Diverticulitis   . Diabetes mellitus without complication   . Anal fissure   . Prostate cancer 11/2013  . Heart murmur   . Nocturia    Past Surgical History  Procedure Laterality Date  . Lumbar disc surgery  2007    L4-L5  . Inguinal hernia repair Right years ago  . Robot assisted laparoscopic radical prostatectomy N/A 03/16/2014    Procedure: ROBOTIC ASSISTED LAPAROSCOPIC RADICAL PROSTATECTOMY;  Surgeon: Ardis Hughs, MD;  Location: [facility];  Service: Urology;  Laterality: N/A;  . Lymphadenectomy Bilateral 03/16/2014    Procedure: LYMPHADENECTOMY;  Surgeon: Ardis Hughs, MD;  Location: [facility];  Service: Urology;  Laterality: Bilateral;   Family  History  Problem Relation Age of Onset  . Colon cancer Sister   . Prostate cancer Father   . Diabetes Mother   . Heart failure Mother   . Cancer Mother     cervical   History  Substance Use Topics  . Smoking status: Former Smoker -- 1.00 packs/day for 20 years    Quit date: 01/16/2014  . Smokeless tobacco: Never Used  . Alcohol Use: No    Review of Systems  Respiratory: Positive for cough (productive cough). Negative for shortness of breath.   Cardiovascular: Negative for chest pain.  Gastrointestinal: Positive for abdominal pain. Negative for nausea, vomiting and diarrhea.  Genitourinary: Negative for dysuria and hematuria.  Musculoskeletal: Positive for myalgias.  Neurological: Negative for weakness.      Allergies  Vicodin  Home Medications   Prior to Admission medications   Medication Sig Start Date End Date Taking? Authorizing Provider  albuterol (PROVENTIL HFA;VENTOLIN HFA) 108 (90 BASE) MCG/ACT inhaler Inhale 2 puffs into the lungs every 6 (six) hours as needed for wheezing or shortness of breath (wheezing).     Historical Provider, MD  ALPRAZolam Duanne Moron) 1 MG tablet Take 1 mg by mouth 3 (three) times daily as needed for anxiety (anxiety).     Historical Provider, MD  aspirin EC 81 MG tablet Take 81 mg by mouth every morning.     Historical Provider, MD  cyclobenzaprine (FLEXERIL) 5 MG tablet Take 1  tablet (5 mg total) by mouth 3 (three) times daily as needed for muscle spasms. 05/02/14   Noland Fordyce, PA-C  docusate sodium (COLACE) 100 MG capsule Take 1 capsule (100 mg total) by mouth 2 (two) times daily as needed (take to keep stool soft.). 03/19/14   Ardis Hughs, MD  fluticasone-salmeterol (ADVAIR HFA) [phone] MCG/ACT inhaler Inhale 2 puffs into the lungs 2 (two) times daily.    Historical Provider, MD  lisinopril-hydrochlorothiazide (PRINZIDE,ZESTORETIC) 20-12.5 MG per tablet Take 1 tablet by mouth every morning.     Historical Provider, MD  metFORMIN  (GLUCOPHAGE) 500 MG tablet Take 500 mg by mouth daily with breakfast.    Historical Provider, MD  ondansetron (ZOFRAN) 4 MG tablet Take 1 tablet (4 mg total) by mouth every 8 (eight) hours as needed for nausea or vomiting. 03/19/14   Ardis Hughs, MD  oxyCODONE-acetaminophen (PERCOCET) 5-325 MG per tablet Take 1 tablet by mouth every 6 (six) hours as needed. 05/26/14   Wandra Arthurs, MD  phenazopyridine (PYRIDIUM) 200 MG tablet Take 1 tablet (200 mg total) by mouth 3 (three) times daily. 05/04/14   Charlesetta Shanks, MD  polyethylene glycol (MIRALAX / GLYCOLAX) packet Take 17 g by mouth daily. 05/04/14   Charlesetta Shanks, MD  predniSONE (DELTASONE) 20 MG tablet 3 tabs po day one, then 2 po daily x 4 days 05/02/14   Noland Fordyce, PA-C  traMADol (ULTRAM) 50 MG tablet Take 1 tablet (50 mg total) by mouth every 6 (six) hours as needed. 05/02/14   Noland Fordyce, PA-C  zolpidem (AMBIEN) 5 MG tablet Take 5 mg by mouth at bedtime as needed for sleep (sleep).     Historical Provider, MD   BP 154/97 mmHg  Pulse 95  Temp(Src) 98 F (36.7 C) (Oral)  Resp 18  Ht 5\' 7"  (1.702 m)  Wt 200 lb (90.719 kg)  BMI 31.32 kg/m2  SpO2 98% Physical Exam  Constitutional: He is oriented to person, place, and time. He appears well-developed and well-nourished.  HENT:  Head: Normocephalic.  Eyes: Conjunctivae are normal.  Neck: Normal range of motion. Neck supple.  Cardiovascular: Normal rate, regular rhythm and normal heart sounds.   Pulmonary/Chest: Effort normal and breath sounds normal.  Anterior lungs clear to auscultation  Abdominal: Soft. There is no tenderness.  No tenderness to deep palpation.  Musculoskeletal: Normal range of motion.  Neurological: He is alert and oriented to person, place, and time.  Skin: Skin is warm and dry.  Psychiatric: He has a normal mood and affect. His behavior is normal.  Nursing note and vitals reviewed.   ED Course  Procedures (including critical care time) DIAGNOSTIC  STUDIES: Oxygen Saturation is 98% on room air, normal by my interpretation.    COORDINATION OF CARE: 3:58 1AM Discussed treatment plan with patient at beside, the patient agrees with the plan and has no further questions at this time.   Labs Review Labs Reviewed - No data to display  Imaging Review No results found.   EKG Interpretation None      MDM   Final diagnoses:  Cough    I personally performed the services described in this documentation, which was scribed in my presence. The recorded information has been reviewed and is accurate.  Pt comes in with cc of flu like sx. Before any workup could be initiated - patient eloped. Low suspicion for any life threatening etiology.   Varney Biles, MD 06/26/14 [phone]

## 2014-06-24 NOTE — ED Notes (Signed)
Pt arrives with flu like symptoms ongoing for a couple of days, cough, minute SHOB. Speaking in full sentences with ease.

## 2014-09-13 ENCOUNTER — Ambulatory Visit: Payer: Medicare Other

## 2014-10-30 ENCOUNTER — Ambulatory Visit (HOSPITAL_COMMUNITY)
Admission: RE | Admit: 2014-10-30 | Discharge: 2014-10-30 | Disposition: A | Discharge status: Home / Self Care | Payer: Medicare Other | Source: Ambulatory Visit | Attending: Physical Medicine and Rehabilitation | Admitting: Physical Medicine and Rehabilitation

## 2014-10-30 ENCOUNTER — Other Ambulatory Visit (HOSPITAL_COMMUNITY): Payer: Self-pay | Admitting: Physical Medicine and Rehabilitation

## 2014-10-30 DIAGNOSIS — M25552 Pain in left hip: Secondary | ICD-10-CM

## 2014-12-07 ENCOUNTER — Ambulatory Visit: Payer: Medicare Other | Admitting: Podiatry

## 2014-12-18 ENCOUNTER — Ambulatory Visit: Payer: Medicare Other | Admitting: Podiatry

## 2014-12-26 ENCOUNTER — Ambulatory Visit (HOSPITAL_BASED_OUTPATIENT_CLINIC_OR_DEPARTMENT_OTHER): Payer: Medicare Other

## 2015-01-06 ENCOUNTER — Encounter (HOSPITAL_COMMUNITY): Payer: Self-pay | Admitting: Nurse Practitioner

## 2015-01-06 ENCOUNTER — Emergency Department (HOSPITAL_COMMUNITY): Payer: Medicare Other

## 2015-01-06 ENCOUNTER — Emergency Department (HOSPITAL_COMMUNITY)
Admission: EM | Admit: 2015-01-06 | Discharge: 2015-01-07 | Disposition: A | Discharge status: Home / Self Care | Payer: Medicare Other | Attending: Emergency Medicine | Admitting: Emergency Medicine

## 2015-01-06 DIAGNOSIS — Z7952 Long term (current) use of systemic steroids: Secondary | ICD-10-CM | POA: Insufficient documentation

## 2015-01-06 DIAGNOSIS — Z79899 Other long term (current) drug therapy: Secondary | ICD-10-CM | POA: Diagnosis not present

## 2015-01-06 DIAGNOSIS — Z87891 Personal history of nicotine dependence: Secondary | ICD-10-CM | POA: Insufficient documentation

## 2015-01-06 DIAGNOSIS — R011 Cardiac murmur, unspecified: Secondary | ICD-10-CM | POA: Insufficient documentation

## 2015-01-06 DIAGNOSIS — Z8546 Personal history of malignant neoplasm of prostate: Secondary | ICD-10-CM | POA: Diagnosis not present

## 2015-01-06 DIAGNOSIS — G8929 Other chronic pain: Secondary | ICD-10-CM | POA: Insufficient documentation

## 2015-01-06 DIAGNOSIS — R1030 Lower abdominal pain, unspecified: Secondary | ICD-10-CM | POA: Insufficient documentation

## 2015-01-06 DIAGNOSIS — I10 Essential (primary) hypertension: Secondary | ICD-10-CM | POA: Diagnosis not present

## 2015-01-06 DIAGNOSIS — Z7982 Long term (current) use of aspirin: Secondary | ICD-10-CM | POA: Insufficient documentation

## 2015-01-06 DIAGNOSIS — E119 Type 2 diabetes mellitus without complications: Secondary | ICD-10-CM | POA: Insufficient documentation

## 2015-01-06 DIAGNOSIS — J449 Chronic obstructive pulmonary disease, unspecified: Secondary | ICD-10-CM | POA: Insufficient documentation

## 2015-01-06 LAB — URINALYSIS, ROUTINE W REFLEX MICROSCOPIC
Bilirubin Urine: NEGATIVE
Glucose, UA: NEGATIVE mg/dL
Hgb urine dipstick: NEGATIVE
Ketones, ur: NEGATIVE mg/dL
LEUKOCYTES UA: NEGATIVE
NITRITE: NEGATIVE
PH: 5 (ref 5.0–8.0)
Protein, ur: NEGATIVE mg/dL
SPECIFIC GRAVITY, URINE: 1.019 (ref 1.005–1.030)
Urobilinogen, UA: 1 mg/dL (ref 0.0–1.0)

## 2015-01-06 LAB — COMPREHENSIVE METABOLIC PANEL
ALBUMIN: 4 g/dL (ref 3.5–5.0)
ALT: 14 U/L — ABNORMAL LOW (ref 17–63)
AST: 24 U/L (ref 15–41)
Alkaline Phosphatase: 69 U/L (ref 38–126)
Anion gap: 9 (ref 5–15)
BUN: 13 mg/dL (ref 6–20)
CHLORIDE: 104 mmol/L (ref 101–111)
CO2: 23 mmol/L (ref 22–32)
Calcium: 9.5 mg/dL (ref 8.9–10.3)
Creatinine, Ser: 1.45 mg/dL — ABNORMAL HIGH (ref 0.61–1.24)
GFR calc Af Amer: 56 mL/min — ABNORMAL LOW (ref >60)
GFR, EST NON AFRICAN AMERICAN: 49 mL/min — AB (ref >60)
Glucose, Bld: 151 mg/dL — ABNORMAL HIGH (ref 65–99)
POTASSIUM: 3.4 mmol/L — AB (ref 3.5–5.1)
SODIUM: 136 mmol/L (ref 135–145)
Total Bilirubin: 0.6 mg/dL (ref 0.3–1.2)
Total Protein: 7 g/dL (ref 6.5–8.1)

## 2015-01-06 LAB — CBC
HEMATOCRIT: 46.5 % (ref 39.0–52.0)
Hemoglobin: 16 g/dL (ref 13.0–17.0)
MCH: 30.7 pg (ref 26.0–34.0)
MCHC: 34.4 g/dL (ref 30.0–36.0)
MCV: 89.3 fL (ref 78.0–100.0)
Platelets: 251 10*3/uL (ref 150–400)
RBC: 5.21 MIL/uL (ref 4.22–5.81)
RDW: 14.7 % (ref 11.5–15.5)
WBC: 7.6 10*3/uL (ref 4.0–10.5)

## 2015-01-06 LAB — CBG MONITORING, ED: Glucose-Capillary: 143 mg/dL — ABNORMAL HIGH (ref 65–99)

## 2015-01-06 MED ORDER — FENTANYL CITRATE (PF) 100 MCG/2ML IJ SOLN
50.0000 ug | Freq: Once | INTRAMUSCULAR | Status: AC
  Administered 2015-01-06: 50 ug via INTRAVENOUS
  Filled 2015-01-06: qty 2

## 2015-01-06 MED ORDER — OXYCODONE-ACETAMINOPHEN 5-325 MG PO TABS: 1.0000 | ORAL_TABLET | ORAL | Status: DC | PRN

## 2015-01-06 MED ORDER — SODIUM CHLORIDE 0.9 % IV BOLUS (SEPSIS)
1000.0000 mL | Freq: Once | INTRAVENOUS | Status: AC
Start: 2015-01-06 — End: 2015-01-06
  Administered 2015-01-06: 1000 mL via INTRAVENOUS

## 2015-01-06 NOTE — ED Notes (Signed)
Patient transported to CT 

## 2015-01-06 NOTE — ED Provider Notes (Signed)
CSN: [id number]     Arrival date & time 01/06/15  1600 History   First MD Initiated Contact with Patient 01/06/15 1823     Chief Complaint  Patient presents with  . Abdominal Pain   HPI   64 year old male presents today with numerous complaints. Patient reports that Saturday morning upon awakening he had lower abdominal pain. Patient reports that the night before he injected medication induced penis as needed for erection, but reports that it had not been refrigerated as indicated. He notes the abdominal pain has continued to worsen spreading bilaterally into his lower abdomen. He reports nausea, denies vomiting, fever, upper abdominal pain, chest pain shortness of breath. Patient denies any changes in the color clarity or characteristics of his urine. He reports he hasn't had a bowel movement since Friday. He reports a significant past medical history of prostatectomy in November. He also notes that he is incontinent of his urine, this is a chronic condition. Patient is tearful, stating that "I've gone through a lot with the surgery".   Past Medical History  Diagnosis Date  . Hypertension   . COPD (chronic obstructive pulmonary disease)   . Emphysema   . Enlarged prostate   . Degenerative arthritis of spine   . Back pain, chronic   . Diverticulitis   . Diabetes mellitus without complication   . Anal fissure   . Prostate cancer 11/2013  . Heart murmur   . Nocturia    Past Surgical History  Procedure Laterality Date  . Lumbar disc surgery  2007    L4-L5  . Inguinal hernia repair Right years ago  . Robot assisted laparoscopic radical prostatectomy N/A 03/16/2014    Procedure: ROBOTIC ASSISTED LAPAROSCOPIC RADICAL PROSTATECTOMY;  Surgeon: Ardis Hughs, MD;  Location: [facility];  Service: Urology;  Laterality: N/A;  . Lymphadenectomy Bilateral 03/16/2014    Procedure: LYMPHADENECTOMY;  Surgeon: Ardis Hughs, MD;  Location: [facility];  Service: Urology;  Laterality: Bilateral;    Family History  Problem Relation Age of Onset  . Colon cancer Sister   . Prostate cancer Father   . Diabetes Mother   . Heart failure Mother   . Cancer Mother     cervical   Social History  Substance Use Topics  . Smoking status: Former Smoker -- 1.00 packs/day for 20 years    Quit date: 01/16/2014  . Smokeless tobacco: Never Used  . Alcohol Use: No    Review of Systems  All other systems reviewed and are negative.   Allergies  Vicodin  Home Medications   Prior to Admission medications   Medication Sig Start Date End Date Taking? Authorizing Provider  albuterol (PROVENTIL HFA;VENTOLIN HFA) 108 (90 BASE) MCG/ACT inhaler Inhale 2 puffs into the lungs every 6 (six) hours as needed for wheezing or shortness of breath (wheezing).    Yes Historical Provider, MD  alprostadil (EDEX) 40 MCG injection 40 mcg by Intracavitary route as needed for erectile dysfunction (50 ml injection). use no more than 3 times per week   Yes Historical Provider, MD  aspirin EC 81 MG tablet Take 81 mg by mouth every morning.    Yes Historical Provider, MD  diphenhydrAMINE (BENADRYL) 25 MG tablet Take 25 mg by mouth every 6 (six) hours as needed for allergies.   Yes Historical Provider, MD  fluticasone-salmeterol (ADVAIR HFA) 115-21 MCG/ACT inhaler Inhale 2 puffs into the lungs 2 (two) times daily.   Yes Historical Provider, MD  lisinopril-hydrochlorothiazide (PRINZIDE,ZESTORETIC) 20-12.5 MG per  tablet Take 1 tablet by mouth every morning.    Yes Historical Provider, MD  metFORMIN (GLUCOPHAGE) 500 MG tablet Take 500 mg by mouth daily with breakfast.   Yes Historical Provider, MD  zolpidem (AMBIEN) 5 MG tablet Take 5 mg by mouth at bedtime as needed for sleep (sleep).    Yes Historical Provider, MD  cyclobenzaprine (FLEXERIL) 5 MG tablet Take 1 tablet (5 mg total) by mouth 3 (three) times daily as needed for muscle spasms. 05/02/14   Noland Fordyce, PA-C  docusate sodium (COLACE) 100 MG capsule Take 1  capsule (100 mg total) by mouth 2 (two) times daily as needed (take to keep stool soft.). 03/19/14   Ardis Hughs, MD  oxyCODONE-acetaminophen (PERCOCET/ROXICET) 5-325 MG per tablet Take 1 tablet by mouth every 4 (four) hours as needed for severe pain. 01/06/15   Okey Regal, PA-C  phenazopyridine (PYRIDIUM) 200 MG tablet Take 1 tablet (200 mg total) by mouth 3 (three) times daily. 05/04/14   Charlesetta Shanks, MD  polyethylene glycol (MIRALAX / GLYCOLAX) packet Take 17 g by mouth daily. 05/04/14   Charlesetta Shanks, MD  predniSONE (DELTASONE) 20 MG tablet 3 tabs po day one, then 2 po daily x 4 days 05/02/14   Noland Fordyce, PA-C  traMADol (ULTRAM) 50 MG tablet Take 1 tablet (50 mg total) by mouth every 6 (six) hours as needed. 05/02/14   Noland Fordyce, PA-C   BP 164/105 mmHg  Pulse 56  Temp(Src) 98.1 F (36.7 C) (Oral)  Resp 18  Ht 5\' 8"  (1.727 m)  Wt 200 lb (90.719 kg)  BMI 30.42 kg/m2  SpO2 98%   Physical Exam  Constitutional: He is oriented to person, place, and time. He appears well-developed and well-nourished.  HENT:  Head: Normocephalic and atraumatic.  Eyes: Conjunctivae are normal. Pupils are equal, round, and reactive to light. Right eye exhibits no discharge. Left eye exhibits no discharge. No scleral icterus.  Neck: Normal range of motion. Neck supple. No JVD present. No tracheal deviation present.  Cardiovascular: Normal rate, regular rhythm, normal heart sounds and intact distal pulses.  Exam reveals no gallop and no friction rub.   No murmur heard. Pulmonary/Chest: Effort normal and breath sounds normal. No stridor. No respiratory distress. He has no wheezes. He has no rales. He exhibits no tenderness.  Abdominal: Soft. Bowel sounds are normal. He exhibits no distension and no mass. There is tenderness. There is no rebound and no guarding.  Suprapubic   Musculoskeletal: Normal range of motion. He exhibits no edema or tenderness.  Neurological: He is alert and oriented to  person, place, and time. Coordination normal.  Skin: Skin is warm and dry. No rash noted. No erythema. No pallor.  Psychiatric: He has a normal mood and affect. His behavior is normal. Judgment and thought content normal.  Nursing note and vitals reviewed.   ED Course  Procedures (including critical care time) Labs Review Labs Reviewed  COMPREHENSIVE METABOLIC PANEL - Abnormal; Notable for the following:    Potassium 3.4 (*)    Glucose, Bld 151 (*)    Creatinine, Ser 1.45 (*)    ALT 14 (*)    GFR calc non Af Amer 49 (*)    GFR calc Af Amer 56 (*)    All other components within normal limits  CBG MONITORING, ED - Abnormal; Notable for the following:    Glucose-Capillary 143 (*)    All other components within normal limits  CBC  URINALYSIS, ROUTINE W REFLEX  MICROSCOPIC (NOT AT [facility])    Imaging Review Ct Abdomen Wo Contrast  01/06/2015   CLINICAL DATA:  64 year old male with Mid abdominal pain  EXAM: CT ABDOMEN WITHOUT CONTRAST  TECHNIQUE: Multidetector CT imaging of the abdomen was performed following the standard protocol without IV contrast.  COMPARISON:  Abdominal CT dated 05/04/2014  FINDINGS: Evaluation is limited in the absence of intravenous contrast as well as streak artifact caused by patient's arms.  Minimal bibasilar dependent atelectatic changes. The visualized lung bases are otherwise clear. No free air or free fluid identified in the visualized abdomen.  The liver, gallbladder, pancreas, spleen, adrenal glands, kidneys, visualized ureters appear unremarkable. There is no evidence of bowel obstruction or inflammation. The visualized appendix appear unremarkable. The visualized abdominal aorta and IVC appear grossly unremarkable. No lymphadenopathy identified. Midline anterior abdominal wall incisional scar noted. The visualized osseous structures are grossly unremarkable. No acute fracture.  IMPRESSION: Unremarkable noncontrast CT of the abdomen.   Electronically Signed   By:  Anner Crete M.D.   On: 01/06/2015 23:04   Ct Head Wo Contrast  01/06/2015   CLINICAL DATA:  Mid abdominal pain with headache.  EXAM: CT HEAD WITHOUT CONTRAST  TECHNIQUE: Contiguous axial images were obtained from the base of the skull through the vertex without intravenous contrast.  COMPARISON:  05/25/2013  FINDINGS: Mild cerebral atrophy diffusely. 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 Low-attenuation changes in the deep white matter consistent with small vessel ischemia. No mass effect or midline shift. No abnormal extra-axial fluid collections. Gray-white matter junctions are distinct. Basal cisterns are not effaced. No evidence of acute intracranial hemorrhage. No depressed skull fractures. Mucosal thickening throughout the paranasal sinuses with old fracture deformity of the right medial orbital wall. Mastoid air cells are not opacified. Old nasal bone fractures.  IMPRESSION: No acute intracranial abnormalities. Chronic atrophy and small vessel ischemic changes. Old nasal bone fractures and old right medial orbital wall fractures. Mucosal thickening in the paranasal sinuses.   Electronically Signed   By: Lucienne Capers M.D.   On: 01/06/2015 22:59   US Scrotum  01/06/2015   CLINICAL DATA:  Lower pelvic pain. History of prostate cancer. Reported penile injection for rectal dysfunction.  EXAM: SCROTAL ULTRASOUND  DOPPLER ULTRASOUND OF THE TESTICLES  TECHNIQUE: Complete ultrasound examination of the testicles, epididymis, and other scrotal structures was performed. Color and spectral Doppler ultrasound were also utilized to evaluate blood flow to the testicles.  COMPARISON:  05/04/2014  FINDINGS: Pulsed Doppler interrogation of both testes demonstrates normal low resistance arterial and venous waveforms bilaterally.  Measurements: 4.2 by 2.3 by 2.4 cm. No mass or microlithiasis visualized.  Left testicle  Measurements: 4.3 by 1.9 by 2.3 cm. No mass or microlithiasis visualized. Tiny marginal scrotal  cyst with a 1.5 mm.  Right epididymis: 1.0 by 0.5 by 0.8 cm right epididymal cyst or spermatocele.  Left epididymis:  Normal in size and appearance.  Hydrocele:  Bilateral hydroceles  Varicocele:  Right varicocele.  IMPRESSION: 1. Bilateral hydroceles and right varicocele. 2. Tiny marginal scrotal cyst on the left, 1.5 mm diameter, likely incidental. 3. Normal vascularity in both testicles.   Electronically Signed   By: Van Clines M.D.   On: 01/06/2015 21:08   [place] Art/ven Flow Abd Pelv Doppler  01/06/2015   CLINICAL DATA:  Lower pelvic pain. History of prostate cancer. Reported penile injection for rectal dysfunction.  EXAM: SCROTAL ULTRASOUND  DOPPLER ULTRASOUND OF THE TESTICLES  TECHNIQUE: Complete ultrasound examination of the  testicles, epididymis, and other scrotal structures was performed. Color and spectral Doppler ultrasound were also utilized to evaluate blood flow to the testicles.  COMPARISON:  05/04/2014  FINDINGS: Pulsed Doppler interrogation of both testes demonstrates normal low resistance arterial and venous waveforms bilaterally.  Measurements: 4.2 by 2.3 by 2.4 cm. No mass or microlithiasis visualized.  Left testicle  Measurements: 4.3 by 1.9 by 2.3 cm. No mass or microlithiasis visualized. Tiny marginal scrotal cyst with a 1.5 mm.  Right epididymis: 1.0 by 0.5 by 0.8 cm right epididymal cyst or spermatocele.  Left epididymis:  Normal in size and appearance.  Hydrocele:  Bilateral hydroceles  Varicocele:  Right varicocele.  IMPRESSION: 1. Bilateral hydroceles and right varicocele. 2. Tiny marginal scrotal cyst on the left, 1.5 mm diameter, likely incidental. 3. Normal vascularity in both testicles.   Electronically Signed   By: Van Clines M.D.   On: 01/06/2015 21:08   I have personally reviewed and evaluated these images and lab results as part of my medical decision-making.   EKG Interpretation None      MDM   Final diagnoses:  Lower abdominal pain    Labs:  Urinalysis, CMP, CBC, CBG-  urinalysis, CMP, CBC, CBG- creatinine 1.45  Imaging: CT Head WO, CT abdomen without contrast, ultrasound scrotum,- no significant findings  Consults:  Therapeutics: Fentanyl, normal saline  Discharge Meds: Percocet  Assessment/Plan: Patient presents with lower abdominal pain, is afebrile, vital signs are reassuring, no significant findings on laboratory and diagnostic imaging. Uncertain etiology of patient's lower abdominal pain. Patient is instructed to contact his urologist and inform him of today's visit  potential side effects to medication taken. Instructed contact his primary care provider and schedule follow-up evaluation this week. If new or worsening signs or symptoms present he is to return to the emergency room for further evaluation and management.         Okey Regal, PA-C 01/07/15 2947  [name], MD 01/08/15 [phone]

## 2015-01-06 NOTE — Discharge Instructions (Signed)
Abdominal Pain Many things can cause abdominal pain. Usually, abdominal pain is not caused by a disease and will improve without treatment. It can often be observed and treated at home. Your health care provider will do a physical exam and possibly order blood tests and X-rays to help determine the seriousness of your pain. However, in many cases, more time must pass before a clear cause of the pain can be found. Before that point, your health care provider may not know if you need more testing or further treatment. HOME CARE INSTRUCTIONS  Monitor your abdominal pain for any changes. The following actions may help to alleviate any discomfort you are experiencing:  Only take over-the-counter or prescription medicines as directed by your health care provider.  Do not take laxatives unless directed to do so by your health care provider.  Try a clear liquid diet (broth, tea, or water) as directed by your health care provider. Slowly move to a bland diet as tolerated. SEEK MEDICAL CARE IF:  You have unexplained abdominal pain.  You have abdominal pain associated with nausea or diarrhea.  You have pain when you urinate or have a bowel movement.  You experience abdominal pain that wakes you in the night.  You have abdominal pain that is worsened or improved by eating food.  You have abdominal pain that is worsened with eating fatty foods.  You have a fever. SEEK IMMEDIATE MEDICAL CARE IF:   Your pain does not go away within 2 hours.  You keep throwing up (vomiting).  Your pain is felt only in portions of the abdomen, such as the right side or the left lower portion of the abdomen.  You pass bloody or black tarry stools. MAKE SURE YOU:  Understand these instructions.   Will watch your condition.   Will get help right away if you are not doing well or get worse.  Document Released: 01/28/2005 Document Revised: 04/25/2013 Document Reviewed: 12/28/2012 [facility] Patient Information  2015 [place], [place]. This information is not intended to replace advice given to you by your health care provider. Make sure you discuss any questions you have with your health care provider.  Please use medication only as directed. Please follow-up with urology and inform them of your visit and all relevant data. Please contact your primary care provider and schedule follow-up evaluation this week. If symptoms worsen please return to the emergency room for further evaluation and management.

## 2015-01-06 NOTE — ED Notes (Signed)
He c/o abd pain for past several days, pain is increasingly worse since onset and now spreading into bilateral sides. C/o nausea, chills. He reports last BM was 3 days ago. He reports urinary incontinence since his prostate removal in November. He reports his symptoms started after he injected some medication that was supposed to be refrigerated but was not,

## 2015-01-06 NOTE — ED Notes (Signed)
Bedside handoff complete

## 2015-01-08 ENCOUNTER — Ambulatory Visit: Payer: Medicare Other | Admitting: Podiatry

## 2015-01-09 ENCOUNTER — Ambulatory Visit (HOSPITAL_BASED_OUTPATIENT_CLINIC_OR_DEPARTMENT_OTHER): Payer: Medicare Other | Attending: Physical Medicine and Rehabilitation

## 2015-04-08 ENCOUNTER — Ambulatory Visit (HOSPITAL_BASED_OUTPATIENT_CLINIC_OR_DEPARTMENT_OTHER): Payer: Medicare Other | Attending: Physical Medicine and Rehabilitation

## 2015-04-08 VITALS — Ht 68.0 in | Wt 210.0 lb

## 2015-04-08 DIAGNOSIS — G4733 Obstructive sleep apnea (adult) (pediatric): Secondary | ICD-10-CM | POA: Insufficient documentation

## 2015-04-08 DIAGNOSIS — R0683 Snoring: Secondary | ICD-10-CM | POA: Diagnosis not present

## 2015-04-09 DIAGNOSIS — Z5900 Homelessness unspecified: Secondary | ICD-10-CM

## 2015-04-10 ENCOUNTER — Ambulatory Visit (INDEPENDENT_AMBULATORY_CARE_PROVIDER_SITE_OTHER): Payer: Medicare Other | Admitting: Podiatry

## 2015-04-10 ENCOUNTER — Encounter: Payer: Self-pay | Admitting: Podiatry

## 2015-04-10 DIAGNOSIS — B351 Tinea unguium: Secondary | ICD-10-CM

## 2015-04-10 DIAGNOSIS — Q828 Other specified congenital malformations of skin: Secondary | ICD-10-CM

## 2015-04-10 DIAGNOSIS — M79675 Pain in left toe(s): Secondary | ICD-10-CM

## 2015-04-10 DIAGNOSIS — M79674 Pain in right toe(s): Secondary | ICD-10-CM

## 2015-04-10 NOTE — Patient Instructions (Signed)
Diabetes and Foot Care Diabetes may cause you to have problems because of poor blood supply (circulation) to your feet and legs. This may cause the skin on your feet to become thinner, break easier, and heal more slowly. Your skin may become dry, and the skin may peel and crack. You may also have nerve damage in your legs and feet causing decreased feeling in them. You may not notice minor injuries to your feet that could lead to infections or more serious problems. Taking care of your feet is one of the most important things you can do for yourself.  HOME CARE INSTRUCTIONS  Wear shoes at all times, even in the house. Do not go barefoot. Bare feet are easily injured.  Check your feet daily for blisters, cuts, and redness. If you cannot see the bottom of your feet, use a mirror or ask someone for help.  Wash your feet with warm water (do not use hot water) and mild soap. Then pat your feet and the areas between your toes until they are completely dry. Do not soak your feet as this can dry your skin.  Apply a moisturizing lotion or petroleum jelly (that does not contain alcohol and is unscented) to the skin on your feet and to dry, brittle toenails. Do not apply lotion between your toes.  Trim your toenails straight across. Do not dig under them or around the cuticle. File the edges of your nails with an emery board or nail file.  Do not cut corns or calluses or try to remove them with medicine.  Wear clean socks or stockings every day. Make sure they are not too tight. Do not wear knee-high stockings since they may decrease blood flow to your legs.  Wear shoes that fit properly and have enough cushioning. To break in new shoes, wear them for just a few hours a day. This prevents you from injuring your feet. Always look in your shoes before you put them on to be sure there are no objects inside.  Do not cross your legs. This may decrease the blood flow to your feet.  If you find a minor scrape,  cut, or break in the skin on your feet, keep it and the skin around it clean and dry. These areas may be cleansed with mild soap and water. Do not cleanse the area with peroxide, alcohol, or iodine.  When you remove an adhesive bandage, be sure not to damage the skin around it.  If you have a wound, look at it several times a day to make sure it is healing.  Do not use heating pads or hot water bottles. They may burn your skin. If you have lost feeling in your feet or legs, you may not know it is happening until it is too late.  Make sure your health care provider performs a complete foot exam at least annually or more often if you have foot problems. Report any cuts, sores, or bruises to your health care provider immediately. SEEK MEDICAL CARE IF:   You have an injury that is not healing.  You have cuts or breaks in the skin.  You have an ingrown nail.  You notice redness on your legs or feet.  You feel burning or tingling in your legs or feet.  You have pain or cramps in your legs and feet.  Your legs or feet are numb.  Your feet always feel cold. SEEK IMMEDIATE MEDICAL CARE IF:   There is increasing redness,   swelling, or pain in or around a wound.  There is a red line that goes up your leg.  Pus is coming from a wound.  You develop a fever or as directed by your health care provider.  You notice a bad smell coming from an ulcer or wound.   This information is not intended to replace advice given to you by your health care provider. Make sure you discuss any questions you have with your health care provider.   Document Released: 04/17/2000 Document Revised: 12/21/2012 Document Reviewed: 09/27/2012 Elsevier Interactive Patient Education 2016 [employer]  

## 2015-04-11 NOTE — Progress Notes (Signed)
Patient ID: Ryan Hopkins, male   DOB: May 30, 1950, 64 y.o.   MRN: [id number]  Subjective: Today this patient presents complaining of painful toenails walking and wearing shoes and is requesting debridement of toenails. Also, patient is complaining of painful callouses on the plantar aspect of right and left feet. The last visit for a similar service was on 06/14/2014  Patient has a history of type 2 diabetes  Objective: Orientated 2  Vascular: No peripheral edema bilaterally DP and PT pulses 2/4 bilaterally Capillary reflex immediate bilaterally  Neurological: Sensation to 10 g monofilament wire intact 3/5 right and 4/5 left Vibratory sensation reactive bilaterally Ankle reflex equal and reactive bilaterally  Dermatological: No open skin lesions bilaterally Nucleated plantar keratoses fifth MPJ bilaterally The toenails are elongated, brittle, discolored, deformed and tender to direct palpation 6-10  Musculoskeletal: Hammertoe second bilaterally  Assessment: Satisfactory neurovascular status Type II diabetic without complications 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

## 2015-04-13 DIAGNOSIS — G4733 Obstructive sleep apnea (adult) (pediatric): Secondary | ICD-10-CM

## 2015-04-13 NOTE — Progress Notes (Signed)
   Patient Name: Ryan Hopkins, Ryan Hopkins Date: 04/08/2015 Gender: Male D.O.B: 17-Apr-1951 Age (years): 64 Referring Provider: Margaretha Sheffield Height (inches): 68 Interpreting Physician: Baird Lyons MD, ABSM Weight (lbs): 210 RPSGT: Madelon Lips BMI: 32 MRN: [id number] Neck Size: 16.50 CLINICAL INFORMATION Sleep Study Type: NPSG Indication for sleep study: OSA Epworth Sleepiness Score: 5 SLEEP STUDY TECHNIQUE As per the AASM Manual for the Scoring of Sleep and Associated Events v2.3 (April 2016) with a hypopnea requiring 4% desaturations. The channels recorded and monitored were frontal, central and occipital EEG, electrooculogram (EOG), submentalis EMG (chin), nasal and oral airflow, thoracic and abdominal wall motion, anterior tibialis EMG, snore microphone, electrocardiogram, and pulse oximetry. MEDICATIONS Patient's medications include: charted for review. Medications self-administered by patient during sleep study : No sleep medicine administered.  SLEEP ARCHITECTURE The study was initiated at 9:29:27 PM and ended at 4:11:16 AM. Sleep onset time was 5.6 minutes and the sleep efficiency was 94.0%. The total sleep time was 377.7 minutes. Stage REM latency was 59.5 minutes. The patient spent 9.80% of the night in stage N1 sleep, 55.79% in stage N2 sleep, 0.00% in stage N3 and 34.42% in REM. Alpha intrusion was absent. Supine sleep was 32.44%.  RESPIRATORY PARAMETERS The overall apnea/hypopnea index (AHI) was 8.9 per hour. There were 9 total apneas, including 9 obstructive, 0 central and 0 mixed apneas. There were 47 hypopneas and 3 RERAs. The AHI during Stage REM sleep was 19.8 per hour. AHI while supine was 5.9 per hour. The mean oxygen saturation was 93.33%. The minimum SpO2 during sleep was 75.00%. Moderate snoring was noted during this study.  CARDIAC DATA The 2 lead EKG demonstrated sinus rhythm. The mean heart rate was 79.51 beats per minute. Other EKG findings include:  None.  LEG MOVEMENT DATA The total PLMS were 26 with a resulting PLMS index of 4.13. Associated arousal with leg movement index was 1.1 .  IMPRESSIONS - Mild obstructive sleep apnea occurred during this study (AHI = 8.9/h). - No significant central sleep apnea occurred during this study (CAI = 0.0/h). - Moderate oxygen desaturation was noted during this study (Min O2 = 75.00%). - The patient snored with Moderate snoring volume. - No cardiac abnormalities were noted during this study. - Clinically significant periodic limb movements did not occur during sleep. No significant associated arousals.  DIAGNOSIS - Obstructive Sleep Apnea (327.23 [G47.33 ICD-10])  RECOMMENDATIONS - Therapeutic CPAP titration to determine optimal pressure required to alleviate sleep disordered breathing. - A fitted oral appliance may be an appropriate alternative - Avoid alcohol, sedatives and other CNS depressants that may worsen sleep apnea and disrupt normal sleep architecture. - Sleep hygiene should be reviewed to assess factors that may improve sleep quality. - Weight management and regular exercise should be initiated or continued if appropriate.  Deneise Lever Diplomate, American Board of Sleep Medicine  ELECTRONICALLY SIGNED ON:  04/13/2015, 11:14 AM [place] PH: [phone]   FX: [phone] [place]

## 2015-04-15 NOTE — Congregational Nurse Program (Signed)
Congregational Nurse Program Note  Date of Encounter: 04/09/2015  Past Medical History: Past Medical History  Diagnosis Date  . Hypertension   . COPD (chronic obstructive pulmonary disease) ([place])   . Emphysema   . Enlarged prostate   . Degenerative arthritis of spine   . Back pain, chronic   . Diverticulitis   . Diabetes mellitus without complication ([place])   . Anal fissure   . Prostate cancer ([place]) 11/2013  . Heart murmur   . Nocturia     Encounter Details:     CNP Questionnaire - 04/09/15 1335    Patient Demographics   Is this a new or existing patient? New   Patient is considered a/an Not Applicable   Patient Assistance   Patient's financial/insurance status Low Income;Other (comment)  medicare   Patient referred to apply for the following financial assistance Not Applicable   Food insecurities addressed Provided food supplies   Transportation assistance No   Assistance securing medications No   Educational health offerings Not Applicable   Encounter Details   Primary purpose of visit Acute Illness/Condition Visit;Navigating the Healthcare System   Was an Emergency Department visit averted? Not Applicable   Does patient have a medical provider? Yes   Patient referred to Not Applicable   Was a mental health screening completed? (GAINS tool) No   Does patient have dental issues? Yes   Was a dental referral made? Yes   Since previous encounter, have you referred patient for abnormal blood pressure that resulted in a new diagnosis or medication change? No   Since previous encounter, have you referred patient for abnormal blood glucose that resulted in a new diagnosis or medication change? No   For Abstraction Use Only   Does patient have insurance? Yes       Clinic visit.  States has a "white bump" on the inside of his cheek.  Denies pain.  Oral lesion noted on inner left side of cheek.  Lesion is white in color and aprox. Pea size.  Plans to see HCP on 12/8.   Instructed client to inform HCP of this lesion during that visit

## 2015-04-25 DIAGNOSIS — Z5900 Homelessness unspecified: Secondary | ICD-10-CM

## 2015-04-25 NOTE — Congregational Nurse Program (Signed)
Congregational Nurse Program Note  Date of Encounter: 04/25/2015  Past Medical History: Past Medical History  Diagnosis Date  . Hypertension   . COPD (chronic obstructive pulmonary disease) ([place])   . Emphysema   . Enlarged prostate   . Degenerative arthritis of spine   . Back pain, chronic   . Diverticulitis   . Diabetes mellitus without complication ([place])   . Anal fissure   . Prostate cancer ([place]) 11/2013  . Heart murmur   . Nocturia     Encounter Details:     CNP Questionnaire - 04/25/15 1359    Patient Demographics   Is this a new or existing patient? Existing   Patient is considered a/an Not Applicable   Patient Assistance   Patient's financial/insurance status Low Income;Other (comment)  medicare   Patient referred to apply for the following financial assistance Not Applicable   Food insecurities addressed Provided food supplies   Transportation assistance No   Assistance securing medications No   Educational health offerings Not Applicable   Encounter Details   Primary purpose of visit [place]   Was an Emergency Department visit averted? Not Applicable   Does patient have a medical provider? Yes   Patient referred to Not Applicable   Was a mental health screening completed? (GAINS tool) No   Does patient have dental issues? Yes   Was a dental referral made? No resources for a referral   Since previous encounter, have you referred patient for abnormal blood pressure that resulted in a new diagnosis or medication change? No   Since previous encounter, have you referred patient for abnormal blood glucose that resulted in a new diagnosis or medication change? No   For Abstraction Use Only   Does patient have insurance? Yes     Clinic visit:  In need of dental care.  Has no financial resources for dental care.  I have placed calls with referral sources.  Am awaiting call back.

## 2015-05-14 DIAGNOSIS — R739 Hyperglycemia, unspecified: Secondary | ICD-10-CM

## 2015-05-16 ENCOUNTER — Ambulatory Visit: Payer: Medicare Other

## 2015-05-20 ENCOUNTER — Ambulatory Visit: Payer: Medicare Other | Admitting: Podiatry

## 2015-05-21 ENCOUNTER — Encounter: Payer: Self-pay | Admitting: Podiatry

## 2015-05-21 ENCOUNTER — Ambulatory Visit (INDEPENDENT_AMBULATORY_CARE_PROVIDER_SITE_OTHER): Payer: Medicare Other | Admitting: Podiatry

## 2015-05-21 DIAGNOSIS — M79674 Pain in right toe(s): Secondary | ICD-10-CM

## 2015-05-21 DIAGNOSIS — Q828 Other specified congenital malformations of skin: Secondary | ICD-10-CM | POA: Diagnosis not present

## 2015-05-21 DIAGNOSIS — B351 Tinea unguium: Secondary | ICD-10-CM

## 2015-05-21 DIAGNOSIS — E1140 Type 2 diabetes mellitus with diabetic neuropathy, unspecified: Secondary | ICD-10-CM | POA: Diagnosis not present

## 2015-05-21 DIAGNOSIS — M79675 Pain in left toe(s): Secondary | ICD-10-CM | POA: Diagnosis not present

## 2015-05-21 NOTE — Progress Notes (Signed)
Patient ID: Ryan Hopkins, male   DOB: 1950/06/27, 65 y.o.   MRN: [id number]  Subjective: 65 y.o. returns the office today for painful, elongated, thickened toenails which he cannot trim himself. Denies any redness or drainage around the nails. Denies any acute changes since last appointment and no new complaints today. Denies any systemic complaints such as fevers, chills, nausea, vomiting.   Objective: AAO 3, NAD DP/PT pulses palpable 2/4, CRT less than 3 seconds Protective sensation decreased with Simms Weinstein monofilament Nails hypertrophic, dystrophic, elongated, brittle, discolored 10. There is tenderness overlying the nails 1-5 bilaterally. There is no surrounding erythema or drainage along the nail sites. Hyperkeratotic lesions submetatarsal 5 bilaterally. Upon debridement no underlying ulceration, drainage or other signs of infection. No open lesions or pre-ulcerative lesions are identified. No other areas of tenderness bilateral lower extremities. No overlying edema, erythema, increased warmth. No pain with calf compression, swelling, warmth, erythema.  Assessment: Patient presents with symptomatic onychomycosis, hyperkeratotic lesions  Plan: -Treatment options including alternatives, risks, complications were discussed -Nails sharply debrided 10 without complication/bleeding. -Hyperkeratotic lesions debrided 2 without complications or bleeding. -Discussed daily foot inspection. If there are any changes, to call the office immediately.  -Follow-up in 3 months or sooner if any problems are to arise. In the meantime, encouraged to call the office with any questions, concerns, changes symptoms.  Celesta Gentile, DPM

## 2015-05-31 LAB — GLUCOSE, POCT (MANUAL RESULT ENTRY): POC GLUCOSE: 117 mg/dL — AB (ref 70–99)

## 2015-05-31 NOTE — Congregational Nurse Program (Signed)
Congregational Nurse Program Note  Date of Encounter: 05/14/2015  Past Medical History: Past Medical History  Diagnosis Date  . Hypertension   . COPD (chronic obstructive pulmonary disease) ([place])   . Emphysema   . Enlarged prostate   . Degenerative arthritis of spine   . Back pain, chronic   . Diverticulitis   . Diabetes mellitus without complication ([place])   . Anal fissure   . Prostate cancer ([place]) 11/2013  . Heart murmur   . Nocturia     Encounter Details:     CNP Questionnaire - 05/14/15 1254    Patient Demographics   Is this a new or existing patient? Existing   Patient is considered a/an Not Applicable   Patient Assistance   Location of Patient Assistance Not Applicable   Patient's financial/insurance status Low Income;Medicaid  medicare   Patient referred to apply for the following financial assistance Not Applicable   Food insecurities addressed Provided food supplies   Transportation assistance No   Assistance securing medications No   Educational health offerings Not Applicable   Encounter Details   Primary purpose of visit [place]   Was an Emergency Department visit averted? Not Applicable   Does patient have a medical provider? Yes   Patient referred to Not Applicable   Was a mental health screening completed? (GAINS tool) No   Does patient have dental issues? Yes   Was a dental referral made? Yes   Since previous encounter, have you referred patient for abnormal blood pressure that resulted in a new diagnosis or medication change? No   Since previous encounter, have you referred patient for abnormal blood glucose that resulted in a new diagnosis or medication change? No   For Abstraction Use Only   Does patient have insurance? Yes       Came to clinic stating he felt shaky.  CBG 117.  Last time he ate was early this am.  Sandwhich given alone with bottle water.  Client expressed feeling better after he ate.

## 2015-06-06 DIAGNOSIS — R739 Hyperglycemia, unspecified: Secondary | ICD-10-CM

## 2015-06-06 DIAGNOSIS — I10 Essential (primary) hypertension: Secondary | ICD-10-CM

## 2015-06-06 LAB — GLUCOSE, POCT (MANUAL RESULT ENTRY): POC GLUCOSE: 117 mg/dL — AB (ref 70–99)

## 2015-06-06 NOTE — Congregational Nurse Program (Signed)
Congregational Nurse Program Note  Date of Encounter: 06/06/2015  Past Medical History: Past Medical History  Diagnosis Date  . Hypertension   . COPD (chronic obstructive pulmonary disease) ([place])   . Emphysema   . Enlarged prostate   . Degenerative arthritis of spine   . Back pain, chronic   . Diverticulitis   . Diabetes mellitus without complication ([place])   . Anal fissure   . Prostate cancer ([place]) 11/2013  . Heart murmur   . Nocturia     Encounter Details:     CNP Questionnaire - 06/06/15 1452    Patient Demographics   Is this a new or existing patient? Existing   Patient is considered a/an Not Applicable   Race African-American/Black   Patient Assistance   Location of Patient Assistance Not Applicable   Patient's financial/insurance status Low Income;Medicaid  medicare   Uninsured Patient No   Patient referred to apply for the following financial assistance Not Applicable   Food insecurities addressed Provided food supplies   Transportation assistance No   Assistance securing medications No   Educational health offerings Not Applicable;Navigating the healthcare system   Encounter Details   Primary purpose of visit Navigating the Healthcare System;Chronic Illness/Condition Visit;Education/Health Concerns   Was an Emergency Department visit averted? Not Applicable   Does patient have a medical provider? Yes   Patient referred to Establish PCP   Was a mental health screening completed? (GAINS tool) No   Does patient have dental issues? Yes   Was a dental referral made? Yes   Since previous encounter, have you referred patient for abnormal blood pressure that resulted in a new diagnosis or medication change? No   Since previous encounter, have you referred patient for abnormal blood glucose that resulted in a new diagnosis or medication change? No   For Abstraction Use Only   Does patient have insurance? Yes     Clinic visit requesting B/P check.  States B/P was  elevated while at the Urologist yesterday.  B/P 140/106  Instructed to contact PHCP to make an appointment and f/u.  Encouraged to return to clinic to monitor B/P.  Client is taking

## 2015-06-11 DIAGNOSIS — Z5900 Homelessness unspecified: Secondary | ICD-10-CM

## 2015-06-11 NOTE — Congregational Nurse Program (Signed)
Congregational Nurse Program Note  Date of Encounter: 06/11/2015  Past Medical History: Past Medical History  Diagnosis Date  . Hypertension   . COPD (chronic obstructive pulmonary disease) ([place])   . Emphysema   . Enlarged prostate   . Degenerative arthritis of spine   . Back pain, chronic   . Diverticulitis   . Diabetes mellitus without complication ([place])   . Anal fissure   . Prostate cancer ([place]) 11/2013  . Heart murmur   . Nocturia     Encounter Details:     CNP Questionnaire - 06/11/15 1518    Patient Demographics   Is this a new or existing patient? Existing   Patient is considered a/an Not Applicable   Race African-American/Black   Patient Assistance   Location of Patient Assistance Not Applicable   Patient's financial/insurance status Low Income;Medicaid  medicare   Uninsured Patient No   Patient referred to apply for the following financial assistance Not Applicable   Food insecurities addressed Provided food supplies   Transportation assistance No   Assistance securing medications No   Educational health offerings Not Applicable;Navigating the healthcare system   Encounter Details   Primary purpose of visit Navigating the Healthcare System;Chronic Illness/Condition Visit;Education/Health Concerns   Was an Emergency Department visit averted? Not Applicable   Does patient have a medical provider? Yes   Patient referred to Establish PCP   Was a mental health screening completed? (GAINS tool) No   Does patient have dental issues? Yes   Was a dental referral made? Yes   Since previous encounter, have you referred patient for abnormal blood pressure that resulted in a new diagnosis or medication change? No   Since previous encounter, have you referred patient for abnormal blood glucose that resulted in a new diagnosis or medication change? No   For Abstraction Use Only   Does patient have insurance? Yes     clinic visit B/P check  140/88

## 2015-07-10 ENCOUNTER — Ambulatory Visit: Payer: Medicare Other | Admitting: Podiatry

## 2015-07-25 ENCOUNTER — Telehealth: Payer: Self-pay | Admitting: Clinical

## 2015-07-25 NOTE — Telephone Encounter (Signed)
Pt wants to establish as a new patient. Ryan Hopkins is given 2 known options: 1.  [facility] is not currently taking new patients, pts are advised to call [facility], [phone] on 08-05-15 at 9am., or 2. [facility] at [phone], is taking new patients. Ryan Hopkins says he does not have a way to write down the [facility] number, but says he will accept a phone call for [facility] to set up an appointment for him to establish care as a new patient. [facility] is contacted, and given Ryan Hopkins contact information, name and DOB, as requested by Ryan Hopkins, and [facility] agrees to call him to schedule a new patient appointment.

## 2015-07-29 ENCOUNTER — Ambulatory Visit (INDEPENDENT_AMBULATORY_CARE_PROVIDER_SITE_OTHER): Payer: Medicare Other | Admitting: Podiatry

## 2015-07-29 ENCOUNTER — Encounter: Payer: Self-pay | Admitting: Podiatry

## 2015-07-29 DIAGNOSIS — E1140 Type 2 diabetes mellitus with diabetic neuropathy, unspecified: Secondary | ICD-10-CM

## 2015-07-29 DIAGNOSIS — B351 Tinea unguium: Secondary | ICD-10-CM

## 2015-07-29 DIAGNOSIS — Q828 Other specified congenital malformations of skin: Secondary | ICD-10-CM

## 2015-07-29 DIAGNOSIS — M79676 Pain in unspecified toe(s): Secondary | ICD-10-CM

## 2015-07-29 NOTE — Progress Notes (Signed)
Patient ID: Ryan Hopkins, male   DOB: April 01, 1951, 65 y.o.   MRN: [id number]  Subjective: 65 y.o. returns the office today for painful, elongated, thickened toenails which he cannot trim himself. Denies any redness or drainage around the nails. Denies any acute changes since last appointment and no new complaints today. Denies any systemic complaints such as fevers, chills, nausea, vomiting.   Objective: AAO 3, NAD DP/PT pulses palpable 2/4, CRT less than 3 seconds Protective sensation decreased with Simms Weinstein monofilament Nails hypertrophic, dystrophic, elongated, brittle, discolored 10. There is tenderness overlying the nails 1-5 bilaterally. There is no surrounding erythema or drainage along the nail sites. Hyperkeratotic lesions submetatarsal 5 bilaterally and to the left hallux. Upon debridement no underlying ulceration, drainage or other signs of infection. No open lesions or pre-ulcerative lesions are identified. No other areas of tenderness bilateral lower extremities. No overlying edema, erythema, increased warmth. No pain with calf compression, swelling, warmth, erythema.  Assessment: Patient presents with symptomatic onychomycosis, hyperkeratotic lesions  Plan: -Treatment options including alternatives, risks, complications were discussed -Nails sharply debrided 10 without complication/bleeding. -Hyperkeratotic lesions debrided 3 without complications or bleeding. -Discussed daily foot inspection. If there are any changes, to call the office immediately.  -Follow-up in 3 months or sooner if any problems are to arise. In the meantime, encouraged to call the office with any questions, concerns, changes symptoms.  Celesta Gentile, DPM

## 2015-08-08 ENCOUNTER — Institutional Professional Consult (permissible substitution): Payer: Medicare Other | Admitting: Internal Medicine

## 2015-08-09 ENCOUNTER — Ambulatory Visit: Payer: Medicare Other | Admitting: Family Medicine

## 2015-08-12 ENCOUNTER — Ambulatory Visit (INDEPENDENT_AMBULATORY_CARE_PROVIDER_SITE_OTHER): Payer: Medicare Other | Admitting: Pulmonary Disease

## 2015-08-12 ENCOUNTER — Encounter: Payer: Self-pay | Admitting: Pulmonary Disease

## 2015-08-12 VITALS — BP 122/88 | HR 88 | Ht 68.0 in | Wt 214.0 lb

## 2015-08-12 DIAGNOSIS — J439 Emphysema, unspecified: Secondary | ICD-10-CM | POA: Diagnosis not present

## 2015-08-12 DIAGNOSIS — G4733 Obstructive sleep apnea (adult) (pediatric): Secondary | ICD-10-CM | POA: Insufficient documentation

## 2015-08-12 DIAGNOSIS — J449 Chronic obstructive pulmonary disease, unspecified: Secondary | ICD-10-CM | POA: Insufficient documentation

## 2015-08-12 NOTE — Patient Instructions (Signed)
1. We will order you an auto CPAP for her mild sleep apnea. 2. Please give [place] a call if you don't receive her machine in the next 1-2 weeks. 3. Call the office if you having issues with her CPAP.  Return to clinic in 2-3 months.

## 2015-08-12 NOTE — Progress Notes (Signed)
Subjective:    Patient ID: Ryan Hopkins, male    DOB: May 16, 1950, 65 y.o.   MRN: [id number]  HPI   This is the case of Ryan Hopkins, 65 y.o. Male, who was referred by Dr. Corinna Capra  in consultation regarding OSA.    As you very well know, patient  denies snoring.  He sleeps by himself. Occasional gasping, apneas.  Sleeps 6 hrs/night. Wakes up at night 2-3x -- unknown reason.  Wakes up refreshed in am.  Denies hypersomnia.   He had a sleep study in 04/2015 . AHI 9.   Pt has COPD. 20PY, quit  25 yrs ago.  Not on meds.   Takes percocet 10/325 TID > on and off x 5  Yrs. Same dose.  Takes ambien at hs.   Review of Systems  Constitutional: Negative.  Negative for fever and unexpected weight change.  HENT: Positive for congestion and postnasal drip. Negative for dental problem, ear pain, nosebleeds, rhinorrhea, sinus pressure, sneezing, sore throat and trouble swallowing.   Eyes: Negative.  Negative for redness and itching.  Respiratory: Positive for cough, shortness of breath and wheezing. Negative for chest tightness.   Cardiovascular: Negative.  Negative for palpitations and leg swelling.  Gastrointestinal: Negative.  Negative for nausea and vomiting.  Endocrine: Negative.   Genitourinary: Negative.  Negative for dysuria.  Musculoskeletal: Positive for arthralgias. Negative for joint swelling.  Skin: Negative.  Negative for rash.  Allergic/Immunologic: Positive for environmental allergies.  Neurological: Negative.  Negative for headaches.  Hematological: Negative.  Does not bruise/bleed easily.  Psychiatric/Behavioral: Negative.  Negative for dysphoric mood. The patient is not nervous/anxious.    Past Medical History  Diagnosis Date  . Hypertension   . COPD (chronic obstructive pulmonary disease) ([place])   . Emphysema   . Enlarged prostate   . Degenerative arthritis of spine   . Back pain, chronic   . Diverticulitis   . Diabetes mellitus without complication ([place])   .  Anal fissure   . Prostate cancer ([place]) 11/2013  . Heart murmur   . Nocturia    Prostate CA -- in remission/ (-) DVT  Family History  Problem Relation Age of Onset  . Colon cancer Sister   . Prostate cancer Father   . Diabetes Mother   . Heart failure Mother   . Cancer Mother     cervical     Past Surgical History  Procedure Laterality Date  . Lumbar disc surgery  2007    L4-L5  . Inguinal hernia repair Right years ago  . Robot assisted laparoscopic radical prostatectomy N/A 03/16/2014    Procedure: ROBOTIC ASSISTED LAPAROSCOPIC RADICAL PROSTATECTOMY;  Surgeon: Ardis Hughs, MD;  Location: [facility];  Service: Urology;  Laterality: N/A;  . Lymphadenectomy Bilateral 03/16/2014    Procedure: LYMPHADENECTOMY;  Surgeon: Ardis Hughs, MD;  Location: [facility];  Service: Urology;  Laterality: Bilateral;    Social History   Social History  . Marital Status: Legally Separated    Spouse Name: N/A  . Number of Children: 3  . Years of Education: N/A   Occupational History  . Not on file.   Social History Main Topics  . Smoking status: Former Smoker -- 1.00 packs/day for 20 years    Quit date: 01/16/2014  . Smokeless tobacco: Never Used  . Alcohol Use: No  . Drug Use: No     Comment: Quit marijuana years ago  . Sexual Activity: Not on file   Other Topics  Concern  . Not on file   Social History Narrative   Retired, was a Biomedical scientist. (-) ETOH.  Allergies  Allergen Reactions  . Vicodin [Hydrocodone-Acetaminophen] Nausea Only    PT. STATED IT MAKES HIM NAUSEATED .     Outpatient Prescriptions Prior to Visit  Medication Sig Dispense Refill  . albuterol (PROVENTIL HFA;VENTOLIN HFA) 108 (90 BASE) MCG/ACT inhaler Inhale 2 puffs into the lungs every 6 (six) hours as needed for wheezing or shortness of breath (wheezing).     Marland Kitchen aspirin EC 81 MG tablet Take 81 mg by mouth every morning.     . diphenhydrAMINE (BENADRYL) 25 MG tablet Take 25 mg by mouth every 6 (six) hours as  needed for allergies.    . fluticasone-salmeterol (ADVAIR HFA) 115-21 MCG/ACT inhaler Inhale 2 puffs into the lungs 2 (two) times daily.    Marland Kitchen lisinopril-hydrochlorothiazide (PRINZIDE,ZESTORETIC) 20-12.5 MG per tablet Take 1 tablet by mouth every morning.     . metFORMIN (GLUCOPHAGE) 500 MG tablet Take 500 mg by mouth daily with breakfast.    . oxyCODONE-acetaminophen (PERCOCET/ROXICET) 5-325 MG per tablet Take 1 tablet by mouth every 4 (four) hours as needed for severe pain. 15 tablet 0  . zolpidem (AMBIEN) 5 MG tablet Take 5 mg by mouth at bedtime as needed for sleep (sleep).     . docusate sodium (COLACE) 100 MG capsule Take 1 capsule (100 mg total) by mouth 2 (two) times daily as needed (take to keep stool soft.). 60 capsule 0  . phenazopyridine (PYRIDIUM) 200 MG tablet Take 1 tablet (200 mg total) by mouth 3 (three) times daily. 6 tablet 0  . alprostadil (EDEX) 40 MCG injection 40 mcg by Intracavitary route as needed for erectile dysfunction (50 ml injection). Reported on 08/12/2015    . cyclobenzaprine (FLEXERIL) 5 MG tablet Take 1 tablet (5 mg total) by mouth 3 (three) times daily as needed for muscle spasms. (Patient not taking: Reported on 08/12/2015) 15 tablet 0  . polyethylene glycol (MIRALAX / GLYCOLAX) packet Take 17 g by mouth daily. (Patient not taking: Reported on 08/12/2015) 14 each 0  . predniSONE (DELTASONE) 20 MG tablet 3 tabs po day one, then 2 po daily x 4 days 11 tablet 0  . traMADol (ULTRAM) 50 MG tablet Take 1 tablet (50 mg total) by mouth every 6 (six) hours as needed. 15 tablet 0   No facility-administered medications prior to visit.   No orders of the defined types were placed in this encounter.          Objective:   Physical Exam   Vitals:  Filed Vitals:   08/12/15 0958  BP: 122/88  Pulse: 88  Height: 5\' 8"  (1.727 m)  Weight: 214 lb (97.07 kg)  SpO2: 99%    Constitutional/General:  Pleasant, well-nourished, well-developed, not in any distress,   Comfortably seating.  Well kempt  Body mass index is 32.55 kg/(m^2). Wt Readings from Last 3 Encounters:  08/12/15 214 lb (97.07 kg)  04/08/15 210 lb (95.255 kg)  01/06/15 200 lb (90.719 kg)    Neck circumference:   HEENT: Pupils equal and reactive to light and accommodation. Anicteric sclerae. Normal nasal mucosa.   No oral  lesions,  mouth clear,  oropharynx clear, no postnasal drip. (-) Oral thrush. No dental caries.  Airway - Mallampati class III  Neck: No masses. Midline trachea. No JVD, (-) LAD. (-) bruits appreciated.  Respiratory/Chest: Grossly normal chest. (-) deformity. (-) Accessory muscle use.  Symmetric expansion. (-)  Tenderness on palpation.  Resonant on percussion.  Diminished BS on both lower lung zones. (-) wheezing, crackles, rhonchi (-) egophony  Cardiovascular: Regular rate and  rhythm, heart sounds normal, no murmur or gallops, no peripheral edema  Gastrointestinal:  Normal bowel sounds. Soft, non-tender. No hepatosplenomegaly.  (-) masses.   Musculoskeletal:  Normal muscle tone. Normal gait.   Extremities: Grossly normal. (-) clubbing, cyanosis.  (-) edema  Skin: (-) rash,lesions seen.   Neurological/Psychiatric : alert, oriented to time, place, person. Normal mood and affect           Assessment & Plan:  OSA (obstructive sleep apnea) Mild OSA AHI 9 (04/2015). Has COPD and has DM and is on chronic pain meds.  Patient has mild symptoms. His main issue is to chronic back pain. We discussed benefits of treatment of sleep apnea. We decided we will start him on auto CPAP given his comorbidities and the fact that he is on chronic pain medications.  Plan : 1. Start autocpap. 2. Will need a mask fitting session. 3. May have issues with compliance.   COPD (chronic obstructive pulmonary disease) ([place]) 20 PY smoking, quit 20 yrs ago. Not on meds. Not too symptomatic. Needs CXR on f/u.  Needs pft on f/u.     Thank you very much for  letting me participate in this patient's care. Please do not hesitate to give me a call if you have any questions or concerns regarding the treatment plan.   Patient will follow up with me in 2-3 mos.     Monica Becton, MD 08/12/2015   3:03 PM [facility] [place] Pager: [phone] Office: [phone], Fax: [phone]

## 2015-08-12 NOTE — Assessment & Plan Note (Addendum)
20 PY smoking, quit 20 yrs ago. Not on meds. Not too symptomatic. Needs CXR on f/u.  Needs pft on f/u.

## 2015-08-12 NOTE — Assessment & Plan Note (Addendum)
Mild OSA AHI 9 (04/2015). Has COPD and has DM and is on chronic pain meds.  Patient has mild symptoms. His main issue is to chronic back pain. We discussed benefits of treatment of sleep apnea. We decided we will start him on auto CPAP given his comorbidities and the fact that he is on chronic pain medications.  Plan : 1. Start autocpap. 2. Will need a mask fitting session. 3. May have issues with compliance.

## 2015-08-19 ENCOUNTER — Ambulatory Visit: Payer: Medicare Other | Admitting: Podiatry

## 2015-08-21 ENCOUNTER — Telehealth: Payer: Self-pay | Admitting: Pulmonary Disease

## 2015-08-21 NOTE — Telephone Encounter (Signed)
Patient states that [facility] is wanting him to pay for his CPAP machine and he cannot afford it, he said that the doctors office told him he wouldn't have to pay anything.  Offered him the CPAP Patient assistance program, patient says that he does not have $100 to pay for a machine, he said he doesn't have any money, he is on a fixed income.  Advised patient that I would ask Dr. Corrie Dandy what he recommends, patient hung up on me.  FYI to Dr. Corrie Dandy

## 2015-08-21 NOTE — Telephone Encounter (Signed)
You did the right thing -- refer to cpap assistance. Thanks.  AD

## 2015-08-23 ENCOUNTER — Ambulatory Visit: Payer: Medicare Other | Admitting: Family Medicine

## 2015-08-28 ENCOUNTER — Emergency Department (HOSPITAL_COMMUNITY)
Admission: EM | Admit: 2015-08-28 | Discharge: 2015-08-28 | Disposition: A | Discharge status: Home / Self Care | Payer: Medicare Other | Attending: Emergency Medicine | Admitting: Emergency Medicine

## 2015-08-28 ENCOUNTER — Encounter (HOSPITAL_COMMUNITY): Payer: Self-pay | Admitting: Emergency Medicine

## 2015-08-28 DIAGNOSIS — R011 Cardiac murmur, unspecified: Secondary | ICD-10-CM | POA: Insufficient documentation

## 2015-08-28 DIAGNOSIS — F17210 Nicotine dependence, cigarettes, uncomplicated: Secondary | ICD-10-CM | POA: Diagnosis not present

## 2015-08-28 DIAGNOSIS — E119 Type 2 diabetes mellitus without complications: Secondary | ICD-10-CM | POA: Insufficient documentation

## 2015-08-28 DIAGNOSIS — R109 Unspecified abdominal pain: Secondary | ICD-10-CM | POA: Diagnosis present

## 2015-08-28 DIAGNOSIS — G8929 Other chronic pain: Secondary | ICD-10-CM | POA: Diagnosis not present

## 2015-08-28 DIAGNOSIS — J439 Emphysema, unspecified: Secondary | ICD-10-CM | POA: Insufficient documentation

## 2015-08-28 DIAGNOSIS — M79604 Pain in right leg: Secondary | ICD-10-CM | POA: Diagnosis not present

## 2015-08-28 DIAGNOSIS — I10 Essential (primary) hypertension: Secondary | ICD-10-CM | POA: Insufficient documentation

## 2015-08-28 DIAGNOSIS — M549 Dorsalgia, unspecified: Secondary | ICD-10-CM | POA: Diagnosis not present

## 2015-08-28 LAB — CBC
HCT: 47.6 % (ref 39.0–52.0)
Hemoglobin: 16.3 g/dL (ref 13.0–17.0)
MCH: 30.5 pg (ref 26.0–34.0)
MCHC: 34.2 g/dL (ref 30.0–36.0)
MCV: 89.1 fL (ref 78.0–100.0)
PLATELETS: 257 10*3/uL (ref 150–400)
RBC: 5.34 MIL/uL (ref 4.22–5.81)
RDW: 14.4 % (ref 11.5–15.5)
WBC: 6.7 10*3/uL (ref 4.0–10.5)

## 2015-08-28 LAB — COMPREHENSIVE METABOLIC PANEL
ALT: 17 U/L (ref 17–63)
AST: 21 U/L (ref 15–41)
Albumin: 3.7 g/dL (ref 3.5–5.0)
Alkaline Phosphatase: 68 U/L (ref 38–126)
Anion gap: 9 (ref 5–15)
BILIRUBIN TOTAL: 0.5 mg/dL (ref 0.3–1.2)
BUN: 14 mg/dL (ref 6–20)
CALCIUM: 9.3 mg/dL (ref 8.9–10.3)
CO2: 23 mmol/L (ref 22–32)
CREATININE: 1.6 mg/dL — AB (ref 0.61–1.24)
Chloride: 108 mmol/L (ref 101–111)
GFR, EST AFRICAN AMERICAN: 50 mL/min — AB (ref >60)
GFR, EST NON AFRICAN AMERICAN: 43 mL/min — AB (ref >60)
Glucose, Bld: 102 mg/dL — ABNORMAL HIGH (ref 65–99)
Potassium: 4.1 mmol/L (ref 3.5–5.1)
Sodium: 140 mmol/L (ref 135–145)
TOTAL PROTEIN: 7.1 g/dL (ref 6.5–8.1)

## 2015-08-28 LAB — CBG MONITORING, ED: GLUCOSE-CAPILLARY: 106 mg/dL — AB (ref 65–99)

## 2015-08-28 LAB — LIPASE, BLOOD: Lipase: 67 U/L — ABNORMAL HIGH (ref 11–51)

## 2015-08-28 IMAGING — DX DG LUMBAR SPINE COMPLETE 4+V
5 series · 5 of 5 positions shown · non-contrast
Comparison: 05/04/2014 sagittal images of the spine

CLINICAL DATA: Low back pain post fall on ice today

EXAM:
LUMBAR SPINE - COMPLETE 4+ VIEW

[l-spine ap]
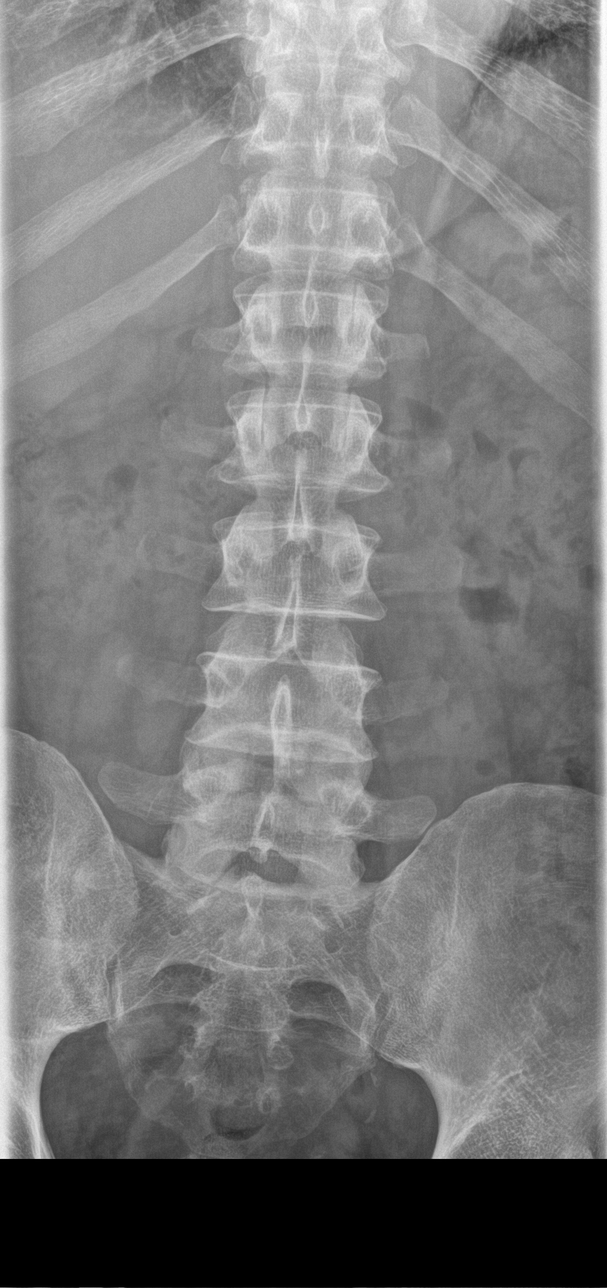

[l-spine obl (1 of 2)]
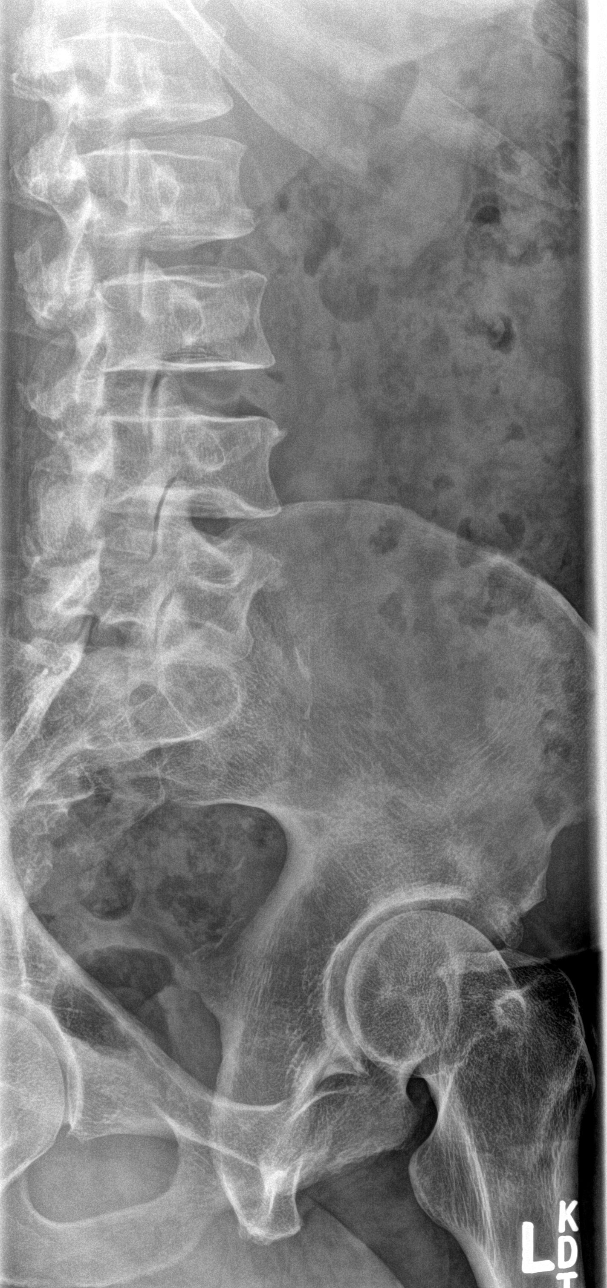

[l-spine lat]
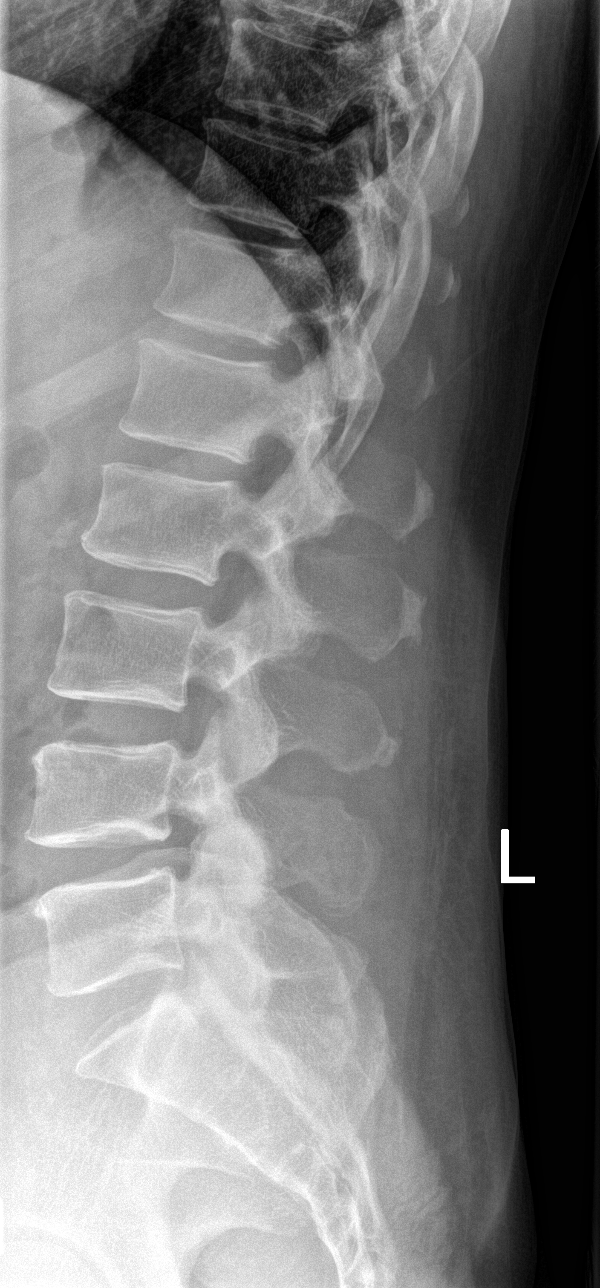

[l-spine spot]
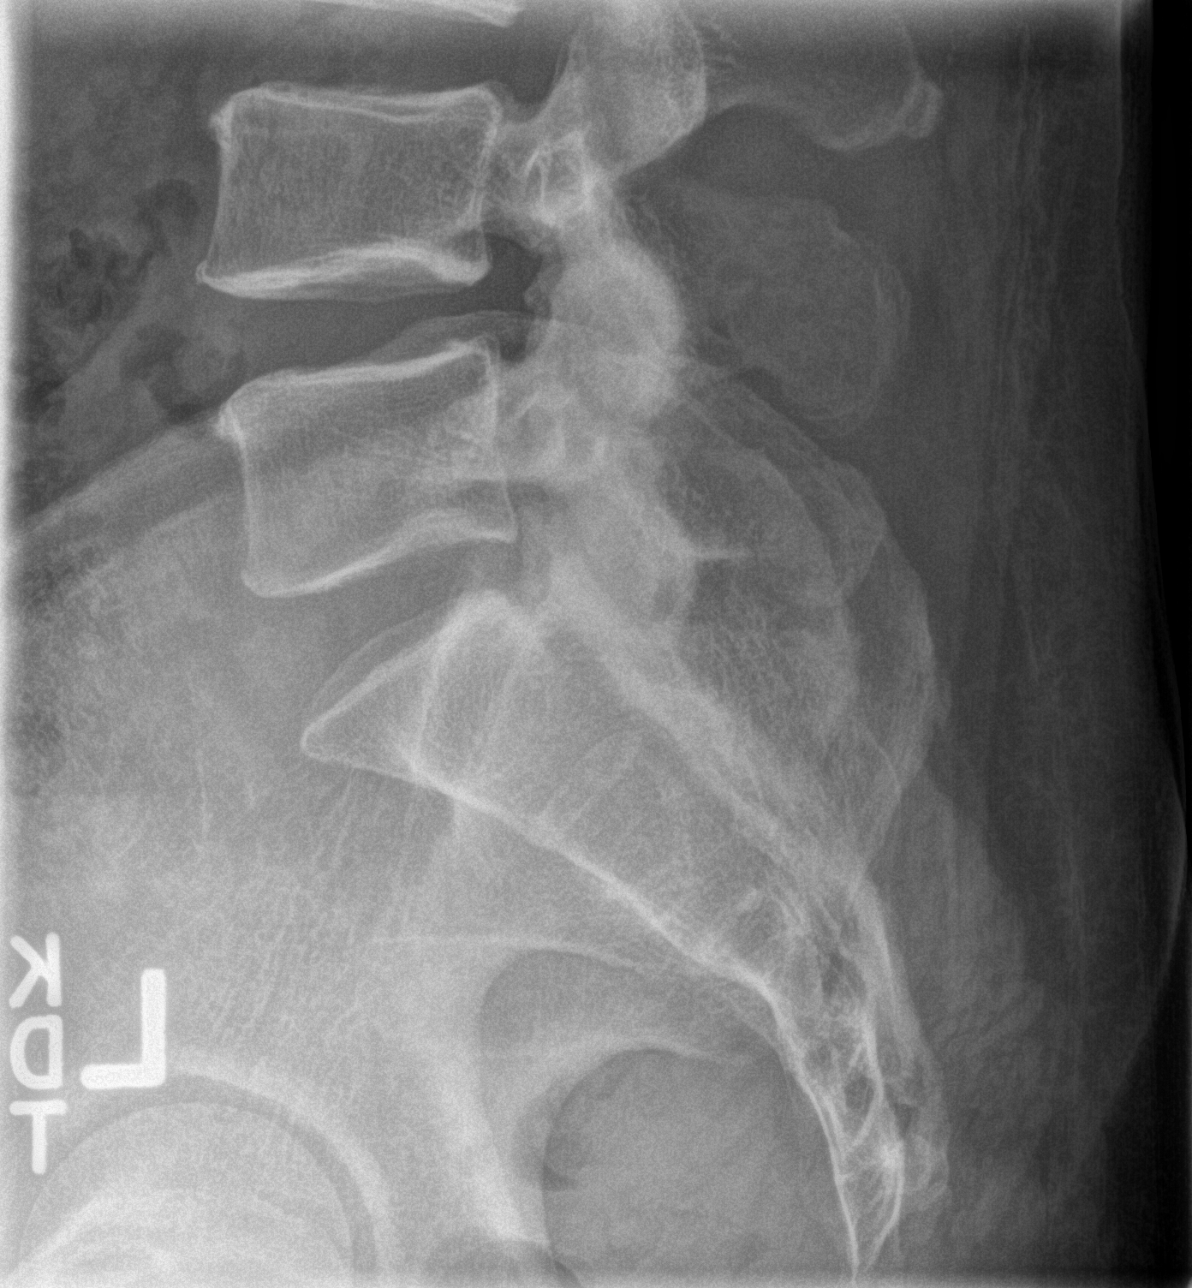

[l-spine obl (2 of 2)]
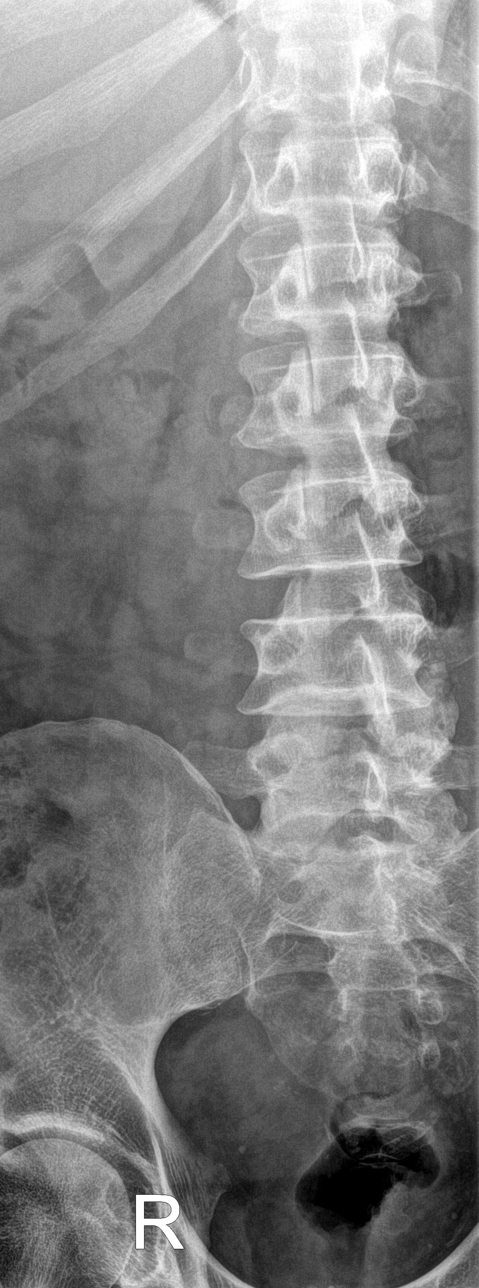

[5 of 5 positions shown; findings below may reference images not displayed]

FINDINGS: Five views of lumbar spine submitted. No acute fracture or
subluxation. Alignment, disc spaces and vertebral body heights are
preserved. Minimal anterior spurring upper endplate of L4 and L5
vertebral bodies. Mild facet degenerative changes L4 and L5 level.
IMPRESSION: No acute fracture or subluxation.  Mild degenerative changes.

## 2015-08-28 IMAGING — DX DG THORACIC SPINE 2V
3 series · 3 of 3 positions shown · non-contrast
Comparison: None.

CLINICAL DATA: Low back pain post fall on ice today

EXAM:
THORACIC SPINE - 2 VIEW

[t-spine ap]
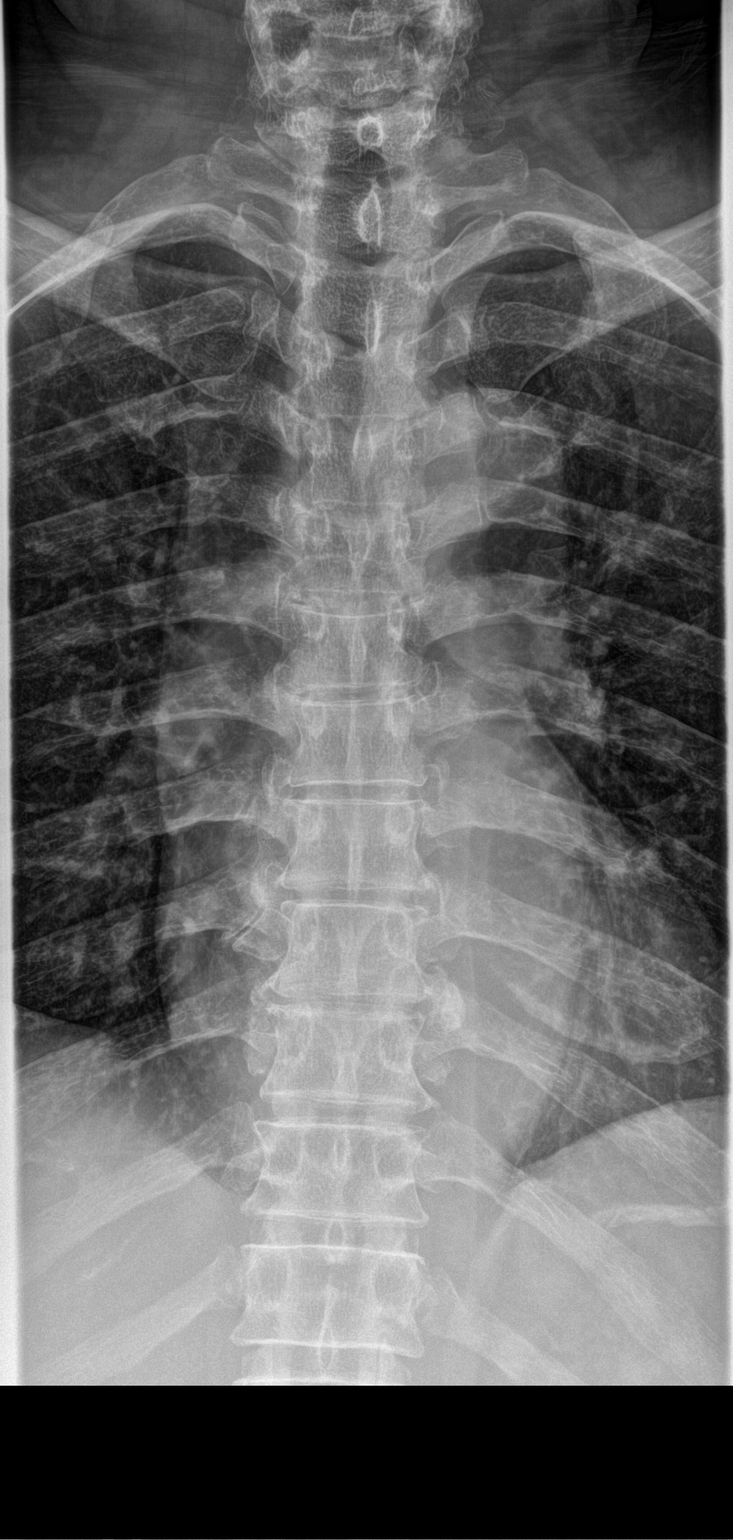

[t-spine lat]
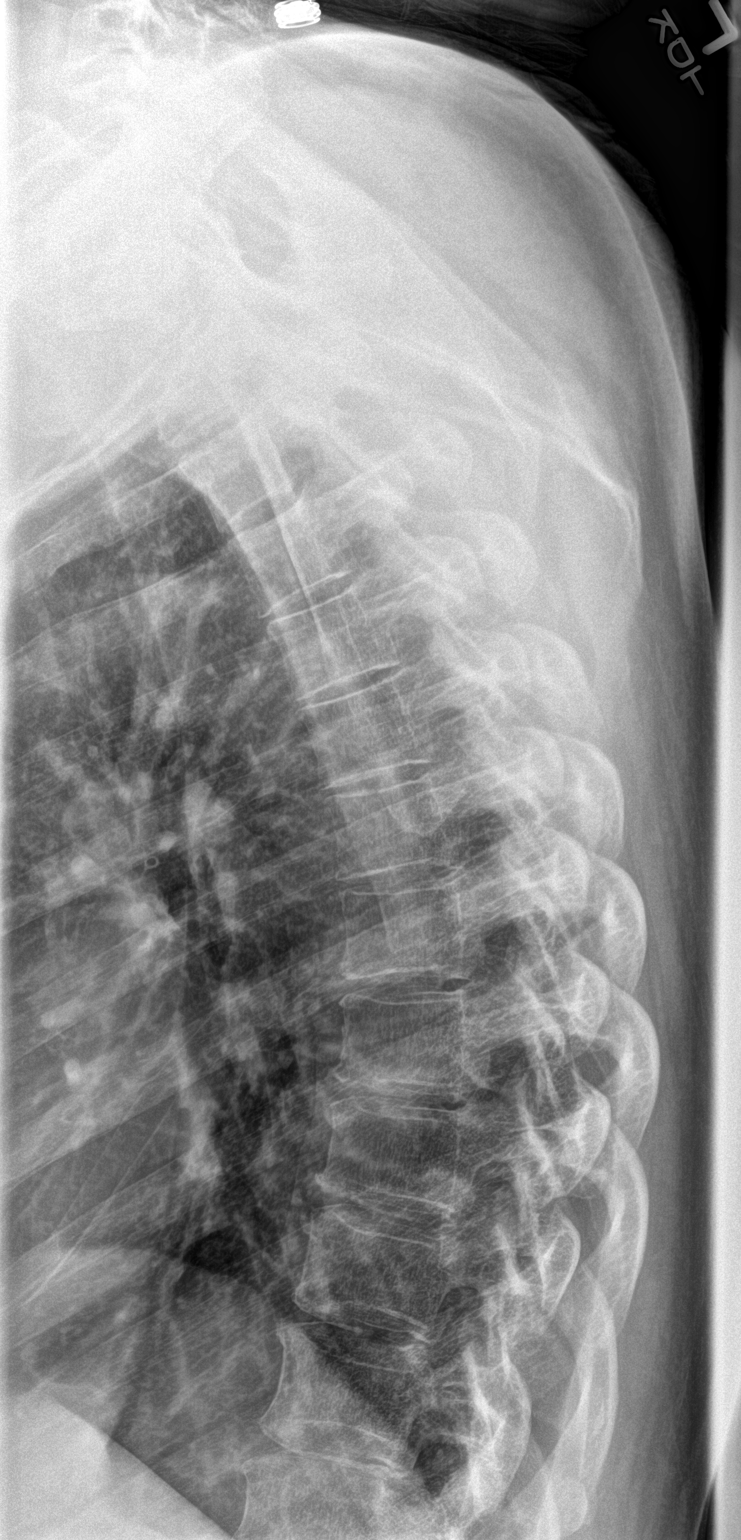

[t-spine swimmers]
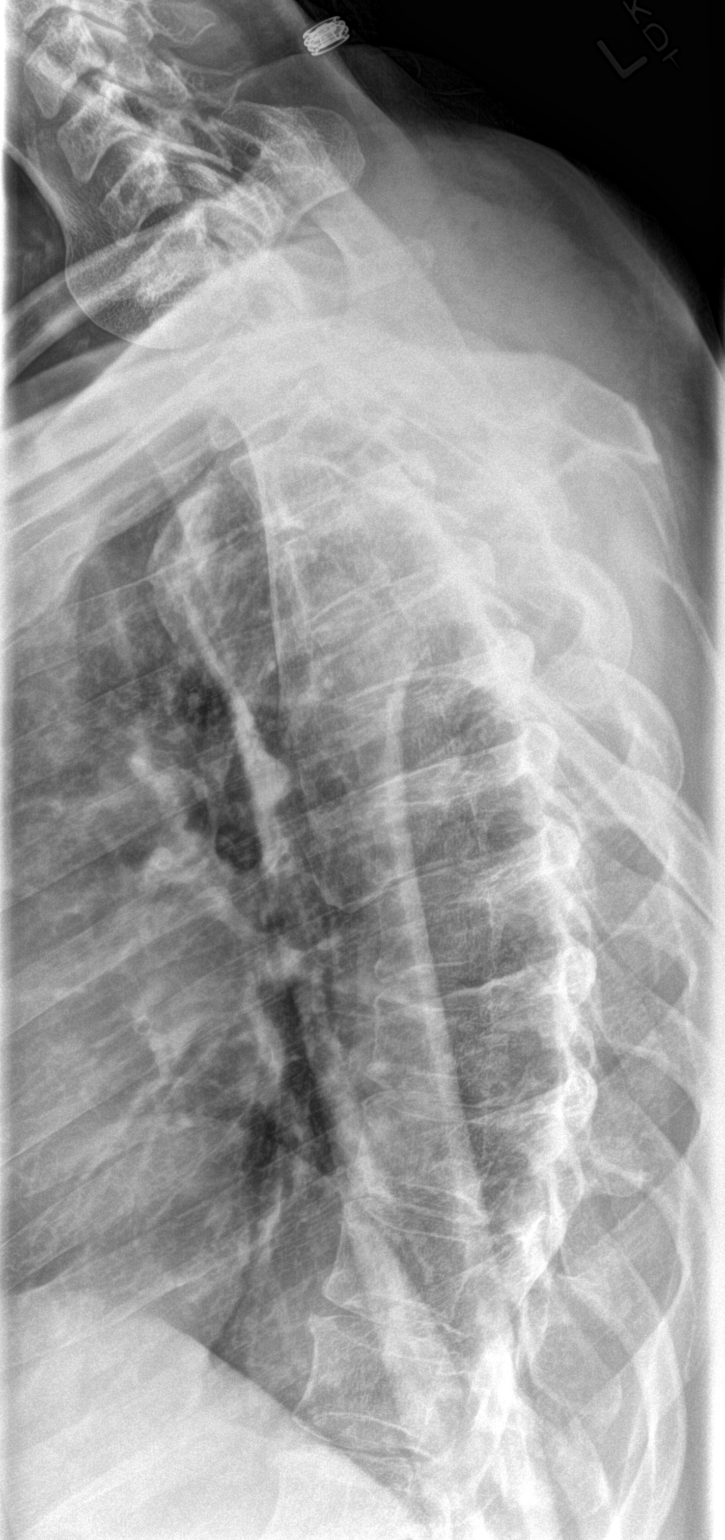

[3 of 3 positions shown; findings below may reference images not displayed]

FINDINGS: Three views of thoracic spine submitted. No acute fracture or
subluxation. Alignment and vertebral body heights are preserved.
Minimal degenerative changes with anterior and lateral spurring
lower thoracic spine.
IMPRESSION: No acute fracture or subluxation. Minimal degenerative changes lower
thoracic spine.

## 2015-08-28 NOTE — ED Notes (Signed)
Made rounds, pt not in room, pharmacy tech states pt walked out. This RN checked lobby and pt is no were insight at this time. MD and PA notified.

## 2015-08-28 NOTE — ED Notes (Signed)
Pt sts abd pain and back pain x weeks with pain in right leg; pt sts no appetite

## 2015-08-30 ENCOUNTER — Emergency Department (HOSPITAL_COMMUNITY): Payer: Medicare Other

## 2015-08-30 ENCOUNTER — Encounter (HOSPITAL_COMMUNITY): Payer: Self-pay | Admitting: Internal Medicine

## 2015-08-30 ENCOUNTER — Observation Stay (HOSPITAL_COMMUNITY)
Admission: EM | Admit: 2015-08-30 | Discharge: 2015-09-02 | Disposition: A | Discharge status: Home / Self Care | Payer: Medicare Other | Attending: Internal Medicine | Admitting: Internal Medicine

## 2015-08-30 DIAGNOSIS — I69398 Other sequelae of cerebral infarction: Secondary | ICD-10-CM | POA: Diagnosis not present

## 2015-08-30 DIAGNOSIS — R4781 Slurred speech: Secondary | ICD-10-CM | POA: Insufficient documentation

## 2015-08-30 DIAGNOSIS — R29810 Facial weakness: Secondary | ICD-10-CM | POA: Diagnosis not present

## 2015-08-30 DIAGNOSIS — F17210 Nicotine dependence, cigarettes, uncomplicated: Secondary | ICD-10-CM | POA: Insufficient documentation

## 2015-08-30 DIAGNOSIS — R109 Unspecified abdominal pain: Secondary | ICD-10-CM

## 2015-08-30 DIAGNOSIS — I440 Atrioventricular block, first degree: Secondary | ICD-10-CM | POA: Diagnosis not present

## 2015-08-30 DIAGNOSIS — R531 Weakness: Secondary | ICD-10-CM | POA: Diagnosis not present

## 2015-08-30 DIAGNOSIS — E119 Type 2 diabetes mellitus without complications: Secondary | ICD-10-CM | POA: Diagnosis not present

## 2015-08-30 DIAGNOSIS — E1122 Type 2 diabetes mellitus with diabetic chronic kidney disease: Secondary | ICD-10-CM | POA: Diagnosis not present

## 2015-08-30 DIAGNOSIS — Z79899 Other long term (current) drug therapy: Secondary | ICD-10-CM | POA: Diagnosis not present

## 2015-08-30 DIAGNOSIS — Z7982 Long term (current) use of aspirin: Secondary | ICD-10-CM | POA: Diagnosis not present

## 2015-08-30 DIAGNOSIS — R079 Chest pain, unspecified: Secondary | ICD-10-CM | POA: Diagnosis not present

## 2015-08-30 DIAGNOSIS — J439 Emphysema, unspecified: Secondary | ICD-10-CM | POA: Diagnosis not present

## 2015-08-30 DIAGNOSIS — Z8546 Personal history of malignant neoplasm of prostate: Secondary | ICD-10-CM | POA: Insufficient documentation

## 2015-08-30 DIAGNOSIS — R269 Unspecified abnormalities of gait and mobility: Secondary | ICD-10-CM | POA: Diagnosis not present

## 2015-08-30 DIAGNOSIS — I959 Hypotension, unspecified: Secondary | ICD-10-CM | POA: Insufficient documentation

## 2015-08-30 DIAGNOSIS — N179 Acute kidney failure, unspecified: Principal | ICD-10-CM

## 2015-08-30 DIAGNOSIS — I10 Essential (primary) hypertension: Secondary | ICD-10-CM | POA: Insufficient documentation

## 2015-08-30 DIAGNOSIS — Z7984 Long term (current) use of oral hypoglycemic drugs: Secondary | ICD-10-CM | POA: Diagnosis not present

## 2015-08-30 DIAGNOSIS — Z885 Allergy status to narcotic agent status: Secondary | ICD-10-CM | POA: Diagnosis not present

## 2015-08-30 DIAGNOSIS — Z8673 Personal history of transient ischemic attack (TIA), and cerebral infarction without residual deficits: Secondary | ICD-10-CM | POA: Insufficient documentation

## 2015-08-30 DIAGNOSIS — I639 Cerebral infarction, unspecified: Secondary | ICD-10-CM

## 2015-08-30 HISTORY — DX: Transient cerebral ischemic attack, unspecified: G45.9

## 2015-08-30 LAB — BASIC METABOLIC PANEL
Anion gap: 16 — ABNORMAL HIGH (ref 5–15)
BUN: 19 mg/dL (ref 6–20)
CO2: 15 mmol/L — ABNORMAL LOW (ref 22–32)
CREATININE: 3.16 mg/dL — AB (ref 0.61–1.24)
Calcium: 9.6 mg/dL (ref 8.9–10.3)
Chloride: 105 mmol/L (ref 101–111)
GFR calc Af Amer: 22 mL/min — ABNORMAL LOW (ref >60)
GFR, EST NON AFRICAN AMERICAN: 19 mL/min — AB (ref >60)
Glucose, Bld: 126 mg/dL — ABNORMAL HIGH (ref 65–99)
POTASSIUM: 4.4 mmol/L (ref 3.5–5.1)
SODIUM: 136 mmol/L (ref 135–145)

## 2015-08-30 LAB — URINALYSIS, ROUTINE W REFLEX MICROSCOPIC
BILIRUBIN URINE: NEGATIVE
Glucose, UA: NEGATIVE mg/dL
Hgb urine dipstick: NEGATIVE
KETONES UR: 15 mg/dL — AB
Leukocytes, UA: NEGATIVE
NITRITE: NEGATIVE
PH: 5 (ref 5.0–8.0)
Protein, ur: NEGATIVE mg/dL
Specific Gravity, Urine: 1.014 (ref 1.005–1.030)

## 2015-08-30 LAB — HEPATIC FUNCTION PANEL
ALT: 26 U/L (ref 17–63)
AST: 65 U/L — ABNORMAL HIGH (ref 15–41)
Albumin: 3.7 g/dL (ref 3.5–5.0)
Alkaline Phosphatase: 59 U/L (ref 38–126)
BILIRUBIN INDIRECT: 1.5 mg/dL — AB (ref 0.3–0.9)
Bilirubin, Direct: 1 mg/dL — ABNORMAL HIGH (ref 0.1–0.5)
Total Bilirubin: 2.5 mg/dL — ABNORMAL HIGH (ref 0.3–1.2)

## 2015-08-30 LAB — I-STAT CG4 LACTIC ACID, ED: LACTIC ACID, VENOUS: 2.77 mmol/L — AB (ref 0.5–2.0)

## 2015-08-30 LAB — LIPASE, BLOOD: LIPASE: 43 U/L (ref 11–51)

## 2015-08-30 LAB — CBC
HCT: 47.9 % (ref 39.0–52.0)
Hemoglobin: 16.4 g/dL (ref 13.0–17.0)
MCH: 30.8 pg (ref 26.0–34.0)
MCHC: 34.2 g/dL (ref 30.0–36.0)
MCV: 89.9 fL (ref 78.0–100.0)
PLATELETS: 250 10*3/uL (ref 150–400)
RBC: 5.33 MIL/uL (ref 4.22–5.81)
RDW: 14.3 % (ref 11.5–15.5)
WBC: 12.7 10*3/uL — AB (ref 4.0–10.5)

## 2015-08-30 LAB — GLUCOSE, CAPILLARY: GLUCOSE-CAPILLARY: 136 mg/dL — AB (ref 65–99)

## 2015-08-30 LAB — TROPONIN I: Troponin I: <0.03 ng/mL (ref <0.031)

## 2015-08-30 LAB — I-STAT TROPONIN, ED: TROPONIN I, POC: 0.01 ng/mL (ref 0.00–0.08)

## 2015-08-30 LAB — LACTIC ACID, PLASMA: LACTIC ACID, VENOUS: 1.1 mmol/L (ref 0.5–2.0)

## 2015-08-30 MED ORDER — FENTANYL CITRATE (PF) 100 MCG/2ML IJ SOLN
50.0000 ug | INTRAMUSCULAR | Status: DC | PRN
  Administered 2015-08-30 (×2): 50 ug via INTRAVENOUS
  Filled 2015-08-30 (×2): qty 2

## 2015-08-30 MED ORDER — ASPIRIN EC 325 MG PO TBEC
325.0000 mg | DELAYED_RELEASE_TABLET | Freq: Every day | ORAL | Status: DC
  Administered 2015-08-30 – 2015-09-02 (×4): 325 mg via ORAL
  Filled 2015-08-30 (×4): qty 1

## 2015-08-30 MED ORDER — IPRATROPIUM-ALBUTEROL 0.5-2.5 (3) MG/3ML IN SOLN: 3.0000 mL | RESPIRATORY_TRACT | Status: DC | PRN

## 2015-08-30 MED ORDER — PANTOPRAZOLE SODIUM 40 MG PO TBEC
40.0000 mg | DELAYED_RELEASE_TABLET | Freq: Every day | ORAL | Status: DC
  Administered 2015-08-30 – 2015-09-02 (×4): 40 mg via ORAL
  Filled 2015-08-30 (×4): qty 1

## 2015-08-30 MED ORDER — ONDANSETRON HCL 4 MG/2ML IJ SOLN
4.0000 mg | Freq: Once | INTRAMUSCULAR | Status: AC
  Administered 2015-08-30: 4 mg via INTRAVENOUS
  Filled 2015-08-30: qty 2

## 2015-08-30 MED ORDER — INSULIN ASPART 100 UNIT/ML ~~LOC~~ SOLN
0.0000 [IU] | Freq: Three times a day (TID) | SUBCUTANEOUS | Status: DC
  Administered 2015-08-31: 2 [IU] via SUBCUTANEOUS

## 2015-08-30 MED ORDER — ONDANSETRON HCL 4 MG/2ML IJ SOLN
4.0000 mg | Freq: Four times a day (QID) | INTRAMUSCULAR | Status: DC | PRN
  Administered 2015-08-31 – 2015-09-02 (×3): 4 mg via INTRAVENOUS
  Filled 2015-08-30 (×3): qty 2

## 2015-08-30 MED ORDER — ACETAMINOPHEN 325 MG PO TABS
650.0000 mg | ORAL_TABLET | ORAL | Status: DC | PRN
  Administered 2015-08-30: 650 mg via ORAL
  Filled 2015-08-30: qty 2

## 2015-08-30 MED ORDER — SODIUM CHLORIDE 0.9 % IV SOLN: INTRAVENOUS | Status: DC

## 2015-08-30 MED ORDER — SODIUM CHLORIDE 0.9 % IV BOLUS (SEPSIS)
500.0000 mL | Freq: Once | INTRAVENOUS | Status: AC
  Administered 2015-08-30: 500 mL via INTRAVENOUS

## 2015-08-30 MED ORDER — SODIUM CHLORIDE 0.9 % IV SOLN
Freq: Once | INTRAVENOUS | Status: AC
Start: 2015-08-30 — End: 2015-08-31
  Administered 2015-08-30: 20:00:00 via INTRAVENOUS

## 2015-08-30 NOTE — H&P (Signed)
[facility] History and Physical  Ryan Hopkins [id number] DOB: 05-07-1950 DOA: 08/30/2015   PCP: Not reported  Chief Complaint: Chest pain, abdominal pain, gait disturbance, slurred speech, worsening right facial droop  HPI: Ryan Hopkins is a 65 y.o. gentleman with a history of COPD/emphysems (he is not on home oxygen), HTN, DM, TIA, and prostate cancer who presents to the ED for evaluation of multiple complaints as listed above.  He was actually here two nights ago and left without being seen due to the prolonged wait time.  He comes back tonight, reporting that he has had 1.5 weeks of decreased appetite/PO intake, right flank pain as well as periumbilical pain that radiates to his substernal area.  He has mild discomfort in his left shoulder as well.  He reports profuse diaphoresis today, but no nausea or vomiting.  No documented fever.  He has had light-headedness, but no LOC.  He reports progressive SOB with exertion and swelling in both ankles.  He denies hematemesis or BRBPR.  He says his stools have been darker than normal but not black.  He has taken NSAIDs to treat his pain.  He also reports a known his of TIA and baseline facial droop.  He feels that this is worse and has been associated with slurred speech and gait disturbance.  ED evaluation included noncontrasted CT of chest/abdomen/pelvis (limited due to renal function) that did not show acute findings to explain his symptoms.  However, he has evidence of AKI.  Hospitalist asked to admit for further evaluation and treatment.  Review of Systems: Constipation; no diarrhea.  Dry cough.  No hematuria.  Otherwise, 12 systems reviewed and negative except as stated in the HPI.  Past Medical History  Diagnosis Date  . Hypertension   . COPD (chronic obstructive pulmonary disease) ([place])   . Emphysema   . Enlarged prostate   . Degenerative arthritis of spine   . Back pain, chronic   . Diverticulitis   . Diabetes mellitus  without complication ([place])   . Anal fissure   . Prostate cancer ([place]) 11/2013  . Heart murmur   . Nocturia   . TIA (transient ischemic attack)    Past Surgical History  Procedure Laterality Date  . Lumbar disc surgery  2007    L4-L5  . Inguinal hernia repair Right years ago  . Robot assisted laparoscopic radical prostatectomy N/A 03/16/2014    Procedure: ROBOTIC ASSISTED LAPAROSCOPIC RADICAL PROSTATECTOMY;  Surgeon: Ardis Hughs, MD;  Location: [facility];  Service: Urology;  Laterality: N/A;  . Lymphadenectomy Bilateral 03/16/2014    Procedure: LYMPHADENECTOMY;  Surgeon: Ardis Hughs, MD;  Location: [facility];  Service: Urology;  Laterality: Bilateral;   Social History:  Social History   Social History Narrative  Still smokes; admits to "sneaking cigarettes".  Occasional beer.  No illicit drug use.  He is married but estranged from his wife.  He has several adult children, but he identifies his daughter Ryan Hopkins as his next of kin.  Allergies  Allergen Reactions  . Vicodin [Hydrocodone-Acetaminophen] Nausea Only    PT. STATED IT MAKES HIM NAUSEATED .    Family History  Problem Relation Age of Onset  . Colon cancer Sister   . Prostate cancer Father   . Diabetes Mother   . Heart failure Mother   . Cancer Mother     cervical   Prior to Admission medications   Medication Sig Start Date End Date Taking? Authorizing Provider  albuterol (PROVENTIL HFA;VENTOLIN  HFA) 108 (90 BASE) MCG/ACT inhaler Inhale 2 puffs into the lungs every 6 (six) hours as needed for wheezing or shortness of breath (wheezing).    Yes Historical Provider, MD  lisinopril (PRINIVIL,ZESTRIL) 20 MG tablet Take 20 mg by mouth daily.   Yes Historical Provider, MD  metFORMIN (GLUCOPHAGE) 500 MG tablet Take 500 mg by mouth 2 (two) times daily with a meal.   Yes Historical Provider, MD  ondansetron (ZOFRAN-ODT) 4 MG disintegrating tablet Take 4 mg by mouth every 8 (eight) hours as needed for nausea or  vomiting.   Yes Historical Provider, MD  zolpidem (AMBIEN) 10 MG tablet Take 5 mg by mouth at bedtime as needed for sleep.   Yes Historical Provider, MD   Physical Exam: Filed Vitals:   08/30/15 2015 08/30/15 2030 08/30/15 2100 08/30/15 2113  BP: 111/75 124/80 119/85 118/83  Pulse: 90 88 86 82  Temp:    97.8 F (36.6 C)  TempSrc:    Oral  Resp: 21 0  16  Height:    5\' 7"  (1.702 m)  Weight:    91.989 kg (202 lb 12.8 oz)  SpO2: 98% 97% 99% 100%    General:  Awake and alert.  Oriented to person, place, time and situation.  NAD.  Head: [place]/AT  Eyes: PERRL bilaterally, conjunctiva are pink.  EOMI.  ENT: Mucous membranes are moist.  No nasal drainage.  Neck is supple.  Cardiovascular: NR/RR.  NO EDEMA PRESENT.  No carotid bruit.  Respiratory: CTA bilaterally.  GI: Abdomen is soft/NT/ND.  Bowel sounds are hypoactive.  No guarding.  Skin: Warm and dry.  Musculoskeletal: Moves all four extremities spontaneously.  Psychiatric: Normal affect.  Neurologic: His has right facial droop.  Otherwise, CN grossly intact.  Patient's strength is symmetric bilaterally in both upper and lower extremities.  No pronator drip.  I did not have the patient ambulate in the ED.  Labs on Admission:  Basic Metabolic Panel:  Recent Labs Lab 08/28/15 0758 08/30/15 1757  NA 140 136  K 4.1 4.4  CL 108 105  CO2 23 15*  GLUCOSE 102* 126*  BUN 14 19  CREATININE 1.60* 3.16*  CALCIUM 9.3 9.6   Liver Function Tests:  Recent Labs Lab 08/28/15 0758 08/30/15 1757  AST 21 65*  ALT 17 26  ALKPHOS 68 59  BILITOT 0.5 2.5*  PROT 7.1 >12.0*  ALBUMIN 3.7 3.7    Recent Labs Lab 08/28/15 0758 08/30/15 1844  LIPASE 67* 43   CBC:  Recent Labs Lab 08/28/15 0758 08/30/15 1757  WBC 6.7 12.7*  HGB 16.3 16.4  HCT 47.6 47.9  MCV 89.1 89.9  PLT 257 250   Cardiac Enzymes:  Recent Labs Lab 08/30/15 2157  TROPONINI <0.03   CBG:  Recent Labs Lab 08/28/15 0753  GLUCAP 106*     Radiological Exams on Admission: Ct Abdomen Pelvis Wo Contrast  08/30/2015  CLINICAL DATA:  Severe central abdominal pain radiating to chest and back. Umbilical area pain radiating up to chest with shortness of breath. EXAM: CT CHEST, ABDOMEN AND PELVIS WITHOUT CONTRAST TECHNIQUE: Multidetector CT imaging of the chest, abdomen and pelvis was performed following the standard protocol without IV contrast. COMPARISON:  CT abdomen dated 01/06/2015. FINDINGS: CT CHEST FINDINGS Mediastinum/Lymph Nodes: Evaluation of the thoracic aorta is limited without intravascular contrast. Thoracic aorta is normal in caliber. No evidence of intramural hematoma. No hemorrhage or edema within the mediastinum. Heart size is normal. No pericardial effusion. Lungs/Pleura: Lungs are clear.  No consolidation or pleural effusion. No pneumothorax. Trachea and central bronchi are unremarkable. Musculoskeletal: No acute or suspicious osseous lesion. Mild degenerative change noted within the thoracic spine. Superficial soft tissues about the chest are unremarkable. CT ABDOMEN PELVIS FINDINGS Hepatobiliary: No mass visualize within the liver on this unenhanced exam. Gallbladder is unremarkable. No bile duct dilatation seen. Pancreas: No mass or inflammatory process identified on this un-enhanced exam. Spleen: Within normal limits in size. Adrenals/Urinary Tract: Kidneys are unremarkable without stone or hydronephrosis. No ureteral or bladder calculi identified. Bladder appears normal. No bladder stone. Adrenal glands appear normal. Stomach/Bowel: Bowel is normal in caliber. No bowel wall thickening or evidence of bowel wall inflammation seen. Appendix is normal. Stomach appears normal. Vascular/Lymphatic: Abdominal aorta is normal in caliber. No enlarged lymph nodes seen within the abdomen or pelvis. Reproductive: No mass or other significant abnormality. Other: No free fluid or abscess collections seen. No free intraperitoneal air.  Musculoskeletal: No acute or suspicious osseous lesion. Mild degenerative change noted within the lumbar spine. Superficial soft tissues are unremarkable. IMPRESSION: No acute findings within the chest, abdomen or pelvis on this noncontrast CT examination. Mild degenerative change within the thoracic and lumbar spine. Electronically Signed   By: Franki Cabot M.D.   On: 08/30/2015 20:15   Ct Chest Wo Contrast  08/30/2015  CLINICAL DATA:  Severe central abdominal pain radiating to chest and back. Umbilical area pain radiating up to chest with shortness of breath. EXAM: CT CHEST, ABDOMEN AND PELVIS WITHOUT CONTRAST TECHNIQUE: Multidetector CT imaging of the chest, abdomen and pelvis was performed following the standard protocol without IV contrast. COMPARISON:  CT abdomen dated 01/06/2015. FINDINGS: CT CHEST FINDINGS Mediastinum/Lymph Nodes: Evaluation of the thoracic aorta is limited without intravascular contrast. Thoracic aorta is normal in caliber. No evidence of intramural hematoma. No hemorrhage or edema within the mediastinum. Heart size is normal. No pericardial effusion. Lungs/Pleura: Lungs are clear. No consolidation or pleural effusion. No pneumothorax. Trachea and central bronchi are unremarkable. Musculoskeletal: No acute or suspicious osseous lesion. Mild degenerative change noted within the thoracic spine. Superficial soft tissues about the chest are unremarkable. CT ABDOMEN PELVIS FINDINGS Hepatobiliary: No mass visualize within the liver on this unenhanced exam. Gallbladder is unremarkable. No bile duct dilatation seen. Pancreas: No mass or inflammatory process identified on this un-enhanced exam. Spleen: Within normal limits in size. Adrenals/Urinary Tract: Kidneys are unremarkable without stone or hydronephrosis. No ureteral or bladder calculi identified. Bladder appears normal. No bladder stone. Adrenal glands appear normal. Stomach/Bowel: Bowel is normal in caliber. No bowel wall thickening or  evidence of bowel wall inflammation seen. Appendix is normal. Stomach appears normal. Vascular/Lymphatic: Abdominal aorta is normal in caliber. No enlarged lymph nodes seen within the abdomen or pelvis. Reproductive: No mass or other significant abnormality. Other: No free fluid or abscess collections seen. No free intraperitoneal air. Musculoskeletal: No acute or suspicious osseous lesion. Mild degenerative change noted within the lumbar spine. Superficial soft tissues are unremarkable. IMPRESSION: No acute findings within the chest, abdomen or pelvis on this noncontrast CT examination. Mild degenerative change within the thoracic and lumbar spine. Electronically Signed   By: Franki Cabot M.D.   On: 08/30/2015 20:15   Dg Chest Portable 1 View  08/30/2015  CLINICAL DATA:  Chest pain. EXAM: PORTABLE CHEST 1 VIEW COMPARISON:  March 14, 2014. FINDINGS: The heart size and mediastinal contours are within normal limits. Both lungs are clear. No pneumothorax or pleural effusion is noted. The visualized skeletal structures are  unremarkable. IMPRESSION: No acute cardiopulmonary abnormality seen. Electronically Signed   By: Marijo Conception, M.D.   On: 08/30/2015 18:45    EKG: Independently reviewed. NSR, prologned PR interval.  No acute ST segment elevation  Assessment/Plan Principal Problem:   AKI (acute kidney injury) ([place]) Active Problems:   Chest pain   Facial droop   DM (diabetes mellitus) ([place])  Admit to telemetry  Abdominal pain and atypical chest pain.   --Serial troponin --Repeat EKG in the AM --Trial of a PPI (highly suspect GERD, particularly with recent increased NSAID use) --Follow hemoglobin and lactic acid after hydration (ischemic bowel would be on the differential as well).  Check stool heme occult since he reports change in stool color.  However, no signs of active blood loss at this time. --Deferring echo and notions of stress test for now (clinical history of very  atypical)  Facial droop, slurred speech --Will order head CT without contrast.  Based on results and clinical exam, may need to consider MRI. --Full strength aspirin for now --Hold Ambien  AKI --Hydrate with NS --Repeat BUN/Cr in the AM --Avoid NSAIDs --Hold metformin, lisinopril  Markedly elevated protein on CMP noted; will repeat in the AM (this is not consistent with labs from two days ago)  Prolonged PR interval on admit EKG --Will check a magnesium level --Repeat EKG in the AM  DM --SSI coverage  Code Status: FULL CODE Family Communication: Patient alone at time of admission Disposition Plan: To be determined based on results of initial tests  Time spent: 55 minutes  [employer] [facility]  08/30/2015, 11:01 PM

## 2015-08-30 NOTE — ED Provider Notes (Signed)
CSN: [id number]     Arrival date & time 08/30/15  1744 History   First MD Initiated Contact with Patient 08/30/15 1750     Chief Complaint  Patient presents with  . Chest Pain  . Abdominal Pain     (Consider location/radiation/quality/duration/timing/severity/associated sxs/prior Treatment) HPI Comments: 65 year old male with history of prostate cancer no current chemotherapy patient had surgery for it, sleep apnea, COPD, smoking, high cholesterol, high blood pressure presents with worsening abdominal and chest pain. Patient had similar symptoms that were milder and was waiting to be seen on Monday however left due to the long wait times. Patient is had worsening similar symptoms including central abdominal pain radiating up into chest. Symptoms now fairly constant initially were intermittent. No cardiac history. No cocaine use. Patient also has mild radiation of the abdominal pain to the right groin area. No focal neuro deficits.  Patient did have diaphoresis with it and blood pressure lower than normal however patient did start taking his blood pressure meds again yesterday. No obvious blood in the stools. Patient has been very stressed recently due to a death in the family. No blood clot history, no recent surgeries, no unilateral leg swelling.  Patient is a 65 y.o. male presenting with chest pain and abdominal pain. The history is provided by the patient.  Chest Pain Associated symptoms: abdominal pain, dizziness, fatigue, nausea and vomiting   Associated symptoms: no back pain, no fever, no headache and no shortness of breath   Abdominal Pain Associated symptoms: chest pain, fatigue, nausea and vomiting   Associated symptoms: no chills, no dysuria, no fever and no shortness of breath     Past Medical History  Diagnosis Date  . Hypertension   . COPD (chronic obstructive pulmonary disease) ([place])   . Emphysema   . Enlarged prostate   . Degenerative arthritis of spine   . Back pain,  chronic   . Diverticulitis   . Diabetes mellitus without complication ([place])   . Anal fissure   . Prostate cancer ([place]) 11/2013  . Heart murmur   . Nocturia   . TIA (transient ischemic attack)    Past Surgical History  Procedure Laterality Date  . Lumbar disc surgery  2007    L4-L5  . Inguinal hernia repair Right years ago  . Robot assisted laparoscopic radical prostatectomy N/A 03/16/2014    Procedure: ROBOTIC ASSISTED LAPAROSCOPIC RADICAL PROSTATECTOMY;  Surgeon: Ardis Hughs, MD;  Location: [facility];  Service: Urology;  Laterality: N/A;  . Lymphadenectomy Bilateral 03/16/2014    Procedure: LYMPHADENECTOMY;  Surgeon: Ardis Hughs, MD;  Location: [facility];  Service: Urology;  Laterality: Bilateral;   Family History  Problem Relation Age of Onset  . Colon cancer Sister   . Prostate cancer Father   . Diabetes Mother   . Heart failure Mother   . Cancer Mother     cervical   Social History  Substance Use Topics  . Smoking status: Current Every Day Smoker -- 1.00 packs/day for 20 years    Last Attempt to Quit: 01/16/2014  . Smokeless tobacco: Never Used  . Alcohol Use: Yes     Comment: Occasional beer    Review of Systems  Constitutional: Positive for fatigue. Negative for fever and chills.  HENT: Negative for congestion.   Eyes: Negative for visual disturbance.  Respiratory: Negative for shortness of breath.   Cardiovascular: Positive for chest pain. Negative for leg swelling.  Gastrointestinal: Positive for nausea, vomiting and abdominal pain.  Genitourinary: Negative for dysuria and flank pain.  Musculoskeletal: Negative for back pain, neck pain and neck stiffness.  Skin: Negative for rash.  Neurological: Positive for dizziness and light-headedness. Negative for headaches.      Allergies  Vicodin  Home Medications   Prior to Admission medications   Medication Sig Start Date End Date Taking? Authorizing Provider  albuterol (PROVENTIL HFA;VENTOLIN HFA)  108 (90 BASE) MCG/ACT inhaler Inhale 2 puffs into the lungs every 6 (six) hours as needed for wheezing or shortness of breath (wheezing).    Yes Historical Provider, MD  lisinopril (PRINIVIL,ZESTRIL) 20 MG tablet Take 20 mg by mouth daily.   Yes Historical Provider, MD  metFORMIN (GLUCOPHAGE) 500 MG tablet Take 500 mg by mouth 2 (two) times daily with a meal.   Yes Historical Provider, MD  ondansetron (ZOFRAN-ODT) 4 MG disintegrating tablet Take 4 mg by mouth every 8 (eight) hours as needed for nausea or vomiting.   Yes Historical Provider, MD  zolpidem (AMBIEN) 10 MG tablet Take 5 mg by mouth at bedtime as needed for sleep.   Yes Historical Provider, MD   BP 128/86 mmHg  Pulse 73  Temp(Src) 98.6 F (37 C) (Oral)  Resp 17  Ht 5\' 7"  (1.702 m)  Wt 202 lb 12.8 oz (91.989 kg)  BMI 31.76 kg/m2  SpO2 100% Physical Exam  Constitutional: He is oriented to person, place, and time. He appears well-developed and well-nourished.  HENT:  Head: Normocephalic and atraumatic.  Dry mucous membranes  Eyes: Right eye exhibits no discharge. Left eye exhibits no discharge.  Neck: Normal range of motion. Neck supple. No tracheal deviation present.  Cardiovascular: Regular rhythm.  Tachycardia present.   Pulmonary/Chest: Effort normal and breath sounds normal.  Abdominal: Soft. He exhibits no distension. There is no tenderness. There is no guarding.  Musculoskeletal: He exhibits no edema.  Neurological: He is alert and oriented to person, place, and time. No cranial nerve deficit. GCS eye subscore is 4. GCS verbal subscore is 5. GCS motor subscore is 6.  Patient general fatigue appearance however has 5+ strength upper and lower extremity is bilateral.   Skin: Skin is warm. No rash noted.  Psychiatric: He has a normal mood and affect.  Nursing note and vitals reviewed.   ED Course  Procedures (including critical care time) EMERGENCY DEPARTMENT ULTRASOUND  Study: Limited Retroperitoneal Ultrasound of the  Abdominal Aorta.  INDICATIONS:Abdominal pain and Back pain Multiple views of the abdominal aorta were obtained in real-time from the diaphragmatic hiatus to the aortic bifurcation in transverse planes with a multi-frequency probe. PERFORMED BY: Myself IMAGES ARCHIVED?: Yes FINDINGS: Maximum aortic dimensions are <3 cm LIMITATIONS:  Body habitus INTERPRETATION:  No abdominal aortic aneurysm   CPT Code: [id number] (limited retroperitoneal)   Labs Review Labs Reviewed  BASIC METABOLIC PANEL - Abnormal; Notable for the following:    CO2 15 (*)    Glucose, Bld 126 (*)    Creatinine, Ser 3.16 (*)    GFR calc non Af Amer 19 (*)    GFR calc Af Amer 22 (*)    Anion gap 16 (*)    All other components within normal limits  CBC - Abnormal; Notable for the following:    WBC 12.7 (*)    All other components within normal limits  HEPATIC FUNCTION PANEL - Abnormal; Notable for the following:    Total Protein >12.0 (*)    AST 65 (*)    Total Bilirubin 2.5 (*)  Bilirubin, Direct 1.0 (*)    Indirect Bilirubin 1.5 (*)    All other components within normal limits  URINALYSIS, ROUTINE W REFLEX MICROSCOPIC (NOT AT [facility]) - Abnormal; Notable for the following:    Ketones, ur 15 (*)    All other components within normal limits  GLUCOSE, CAPILLARY - Abnormal; Notable for the following:    Glucose-Capillary 136 (*)    All other components within normal limits  COMPREHENSIVE METABOLIC PANEL - Abnormal; Notable for the following:    Creatinine, Ser 2.27 (*)    GFR calc non Af Amer 28 (*)    GFR calc Af Amer 33 (*)    All other components within normal limits  GLUCOSE, CAPILLARY - Abnormal; Notable for the following:    Glucose-Capillary 101 (*)    All other components within normal limits  GLUCOSE, CAPILLARY - Abnormal; Notable for the following:    Glucose-Capillary 119 (*)    All other components within normal limits  GLUCOSE, CAPILLARY - Abnormal; Notable for the following:     Glucose-Capillary 152 (*)    All other components within normal limits  BASIC METABOLIC PANEL - Abnormal; Notable for the following:    Glucose, Bld 119 (*)    Creatinine, Ser 1.63 (*)    GFR calc non Af Amer 42 (*)    GFR calc Af Amer 49 (*)    All other components within normal limits  GLUCOSE, CAPILLARY - Abnormal; Notable for the following:    Glucose-Capillary 119 (*)    All other components within normal limits  GLUCOSE, CAPILLARY - Abnormal; Notable for the following:    Glucose-Capillary 114 (*)    All other components within normal limits  GLUCOSE, CAPILLARY - Abnormal; Notable for the following:    Glucose-Capillary 119 (*)    All other components within normal limits  GLUCOSE, CAPILLARY - Abnormal; Notable for the following:    Glucose-Capillary 115 (*)    All other components within normal limits  GLUCOSE, CAPILLARY - Abnormal; Notable for the following:    Glucose-Capillary 121 (*)    All other components within normal limits  I-STAT CG4 LACTIC ACID, ED - Abnormal; Notable for the following:    Lactic Acid, Venous 2.77 (*)    All other components within normal limits  LIPASE, BLOOD  TROPONIN I  TROPONIN I  TROPONIN I  LACTIC ACID, PLASMA  CBC  MAGNESIUM  CBC  TSH  HEMOGLOBIN A1C  COMPREHENSIVE METABOLIC PANEL  CBC  I-STAT TROPOININ, ED    Imaging Review Ct Head Wo Contrast  08/31/2015  CLINICAL DATA:  Right facial droop EXAM: CT HEAD WITHOUT CONTRAST TECHNIQUE: Contiguous axial images were obtained from the base of the skull through the vertex without intravenous contrast. COMPARISON:  01/06/2015 FINDINGS: Mild diffuse cortical atrophy. 2.5 cm focus of low attenuation deep periventricular white matter on the right, identical to prior study. No evidence of acute vascular territory infarct. No hemorrhage or extra-axial fluid. No hydrocephalus. Both middle cerebral arteries appear relatively hyper attenuating, left more so than right. This appearance is unchanged  from the prior study. There are no findings of infarction in the middle cerebral artery territories. The calvarium is intact. Mild chronic inflammatory change in the maxillary sinuses bilaterally, with air-fluid level in right maxillary sinus similar to prior study. IMPRESSION: Stable chronic right basal ganglia infarct. No acute intracranial abnormality. Sinusitis. Electronically Signed   By: Skipper Cliche M.D.   On: 08/31/2015 08:25   Mr Brain Lottie Dawson  Contrast  08/31/2015  CLINICAL DATA:  Slurred speech, gait disturbance, and worsening facial droop. EXAM: MRI HEAD WITHOUT CONTRAST TECHNIQUE: Multiplanar, multiecho pulse sequences of the brain and surrounding structures were obtained without intravenous contrast. COMPARISON:  Head CT 08/31/2015 FINDINGS: Multiple sequences are mildly motion degraded, and sagittal T1 images are moderately to severely degraded. The patient could not tolerate the complete examination, and T2* gradient echo and axial T1 images were not obtained. There is no evidence of acute infarct, mass, midline shift, or extra-axial fluid collection. There is mild global cerebral atrophy. A chronic right basal ganglia infarct is present with ex vacuo dilatation of the right lateral ventricle and associated chronic blood products. Periventricular white matter T2 hyperintensities elsewhere are compatible with mild chronic small vessel ischemic disease. Mild bilateral maxillary sinus mucosal thickening and small volume right maxillary sinus fluid are present. Depression of the right lamina papyracea may reflect an old medial orbital blowout fracture. Globes appear intact. There is a trace left mastoid effusion. The distal right vertebral artery is not well seen and may be hypoplastic. Other major intracranial vascular flow voids are preserved. IMPRESSION: 1. Motion degraded, incomplete examination.  No acute infarct. 2. Chronic right basal ganglia infarct with mild chronic small vessel ischemic disease  elsewhere. Electronically Signed   By: Logan Bores M.D.   On: 08/31/2015 16:21  Ct Abdomen Pelvis Wo Contrast  08/30/2015  CLINICAL DATA:  Severe central abdominal pain radiating to chest and back. Umbilical area pain radiating up to chest with shortness of breath. EXAM: CT CHEST, ABDOMEN AND PELVIS WITHOUT CONTRAST TECHNIQUE: Multidetector CT imaging of the chest, abdomen and pelvis was performed following the standard protocol without IV contrast. COMPARISON:  CT abdomen dated 01/06/2015. FINDINGS: CT CHEST FINDINGS Mediastinum/Lymph Nodes: Evaluation of the thoracic aorta is limited without intravascular contrast. Thoracic aorta is normal in caliber. No evidence of intramural hematoma. No hemorrhage or edema within the mediastinum. Heart size is normal. No pericardial effusion. Lungs/Pleura: Lungs are clear. No consolidation or pleural effusion. No pneumothorax. Trachea and central bronchi are unremarkable. Musculoskeletal: No acute or suspicious osseous lesion. Mild degenerative change noted within the thoracic spine. Superficial soft tissues about the chest are unremarkable. CT ABDOMEN PELVIS FINDINGS Hepatobiliary: No mass visualize within the liver on this unenhanced exam. Gallbladder is unremarkable. No bile duct dilatation seen. Pancreas: No mass or inflammatory process identified on this un-enhanced exam. Spleen: Within normal limits in size. Adrenals/Urinary Tract: Kidneys are unremarkable without stone or hydronephrosis. No ureteral or bladder calculi identified. Bladder appears normal. No bladder stone. Adrenal glands appear normal. Stomach/Bowel: Bowel is normal in caliber. No bowel wall thickening or evidence of bowel wall inflammation seen. Appendix is normal. Stomach appears normal. Vascular/Lymphatic: Abdominal aorta is normal in caliber. No enlarged lymph nodes seen within the abdomen or pelvis. Reproductive: No mass or other significant abnormality. Other: No free fluid or abscess collections  seen. No free intraperitoneal air. Musculoskeletal: No acute or suspicious osseous lesion. Mild degenerative change noted within the lumbar spine. Superficial soft tissues are unremarkable. IMPRESSION: No acute findings within the chest, abdomen or pelvis on this noncontrast CT examination. Mild degenerative change within the thoracic and lumbar spine. Electronically Signed   By: Franki Cabot M.D.   On: 08/30/2015 20:15   Ct Head Wo Contrast  08/31/2015  CLINICAL DATA:  Right facial droop EXAM: CT HEAD WITHOUT CONTRAST TECHNIQUE: Contiguous axial images were obtained from the base of the skull through the vertex without intravenous contrast. COMPARISON:  01/06/2015 FINDINGS: Mild diffuse cortical atrophy. 2.5 cm focus of low attenuation deep periventricular white matter on the right, identical to prior study. No evidence of acute vascular territory infarct. No hemorrhage or extra-axial fluid. No hydrocephalus. Both middle cerebral arteries appear relatively hyper attenuating, left more so than right. This appearance is unchanged from the prior study. There are no findings of infarction in the middle cerebral artery territories. The calvarium is intact. Mild chronic inflammatory change in the maxillary sinuses bilaterally, with air-fluid level in right maxillary sinus similar to prior study. IMPRESSION: Stable chronic right basal ganglia infarct. No acute intracranial abnormality. Sinusitis. Electronically Signed   By: Skipper Cliche M.D.   On: 08/31/2015 08:25   Ct Chest Wo Contrast  08/30/2015  CLINICAL DATA:  Severe central abdominal pain radiating to chest and back. Umbilical area pain radiating up to chest with shortness of breath. EXAM: CT CHEST, ABDOMEN AND PELVIS WITHOUT CONTRAST TECHNIQUE: Multidetector CT imaging of the chest, abdomen and pelvis was performed following the standard protocol without IV contrast. COMPARISON:  CT abdomen dated 01/06/2015. FINDINGS: CT CHEST FINDINGS Mediastinum/Lymph  Nodes: Evaluation of the thoracic aorta is limited without intravascular contrast. Thoracic aorta is normal in caliber. No evidence of intramural hematoma. No hemorrhage or edema within the mediastinum. Heart size is normal. No pericardial effusion. Lungs/Pleura: Lungs are clear. No consolidation or pleural effusion. No pneumothorax. Trachea and central bronchi are unremarkable. Musculoskeletal: No acute or suspicious osseous lesion. Mild degenerative change noted within the thoracic spine. Superficial soft tissues about the chest are unremarkable. CT ABDOMEN PELVIS FINDINGS Hepatobiliary: No mass visualize within the liver on this unenhanced exam. Gallbladder is unremarkable. No bile duct dilatation seen. Pancreas: No mass or inflammatory process identified on this un-enhanced exam. Spleen: Within normal limits in size. Adrenals/Urinary Tract: Kidneys are unremarkable without stone or hydronephrosis. No ureteral or bladder calculi identified. Bladder appears normal. No bladder stone. Adrenal glands appear normal. Stomach/Bowel: Bowel is normal in caliber. No bowel wall thickening or evidence of bowel wall inflammation seen. Appendix is normal. Stomach appears normal. Vascular/Lymphatic: Abdominal aorta is normal in caliber. No enlarged lymph nodes seen within the abdomen or pelvis. Reproductive: No mass or other significant abnormality. Other: No free fluid or abscess collections seen. No free intraperitoneal air. Musculoskeletal: No acute or suspicious osseous lesion. Mild degenerative change noted within the lumbar spine. Superficial soft tissues are unremarkable. IMPRESSION: No acute findings within the chest, abdomen or pelvis on this noncontrast CT examination. Mild degenerative change within the thoracic and lumbar spine. Electronically Signed   By: Franki Cabot M.D.   On: 08/30/2015 20:15   Mr Brain Wo Contrast  08/31/2015  CLINICAL DATA:  Slurred speech, gait disturbance, and worsening facial droop.  EXAM: MRI HEAD WITHOUT CONTRAST TECHNIQUE: Multiplanar, multiecho pulse sequences of the brain and surrounding structures were obtained without intravenous contrast. COMPARISON:  Head CT 08/31/2015 FINDINGS: Multiple sequences are mildly motion degraded, and sagittal T1 images are moderately to severely degraded. The patient could not tolerate the complete examination, and T2* gradient echo and axial T1 images were not obtained. There is no evidence of acute infarct, mass, midline shift, or extra-axial fluid collection. There is mild global cerebral atrophy. A chronic right basal ganglia infarct is present with ex vacuo dilatation of the right lateral ventricle and associated chronic blood products. Periventricular white matter T2 hyperintensities elsewhere are compatible with mild chronic small vessel ischemic disease. Mild bilateral maxillary sinus mucosal thickening and small volume right maxillary sinus  fluid are present. Depression of the right lamina papyracea may reflect an old medial orbital blowout fracture. Globes appear intact. There is a trace left mastoid effusion. The distal right vertebral artery is not well seen and may be hypoplastic. Other major intracranial vascular flow voids are preserved. IMPRESSION: 1. Motion degraded, incomplete examination.  No acute infarct. 2. Chronic right basal ganglia infarct with mild chronic small vessel ischemic disease elsewhere. Electronically Signed   By: Logan Bores M.D.   On: 08/31/2015 16:21   Dg Chest Portable 1 View  08/30/2015  CLINICAL DATA:  Chest pain. EXAM: PORTABLE CHEST 1 VIEW COMPARISON:  March 14, 2014. FINDINGS: The heart size and mediastinal contours are within normal limits. Both lungs are clear. No pneumothorax or pleural effusion is noted. The visualized skeletal structures are unremarkable. IMPRESSION: No acute cardiopulmonary abnormality seen. Electronically Signed   By: Marijo Conception, M.D.   On: 08/30/2015 18:45    I have  personally reviewed and evaluated these images and lab results as part of my medical decision-making.   EKG Interpretation   Date/Time:  Friday August 30 2015 17:50:45 EDT Ventricular Rate:  111 PR Interval:    QRS Duration: 89 QT Interval:  335 QTC Calculation: 455 R Axis:   68 Text Interpretation:  Sinus tachycardia with prolonged PR Confirmed by  Candia Kingsbury  MD, Lavoy Bernards ([id number]) on 08/30/2015 6:02:16 PM      MDM   Final diagnoses:  Abdominal pain  Acute chest pain  Acute renal failure, unspecified acute renal failure type [facility])   Patient presents with significant abdominal chest discomfort. Worsening symptoms for the past week. Plan for cardiac workup along with CT scan to look for dissection with presentation history. IV fluids, pain meds, nausea and given shortly after arrival. Likely plan for observation for cardiac eval if no significant findings on CT scan. The patients results and plan were reviewed and discussed.   Any x-rays performed were independently reviewed by myself.   Differential diagnosis were considered with the presenting HPI.  Medications  acetaminophen (TYLENOL) tablet 650 mg (650 mg Oral Given 08/30/15 2312)  ondansetron (ZOFRAN) injection 4 mg (4 mg Intravenous Given 09/01/15 0936)  pantoprazole (PROTONIX) EC tablet 40 mg (40 mg Oral Given 09/01/15 0936)  aspirin EC tablet 325 mg (325 mg Oral Given 09/01/15 0936)  insulin aspart (novoLOG) injection 0-9 Units (0 Units Subcutaneous Not Given 09/01/15 1630)  ipratropium-albuterol (DUONEB) 0.5-2.5 (3) MG/3ML nebulizer solution 3 mL (not administered)  zolpidem (AMBIEN) tablet 5 mg (5 mg Oral Given 09/01/15 2127)  enoxaparin (LOVENOX) injection 40 mg (40 mg Subcutaneous Given 09/01/15 1553)  nicotine (NICODERM CQ - dosed in mg/24 hours) patch 21 mg (21 mg Transdermal Patch Applied 09/01/15 1553)  oxyCODONE-acetaminophen (PERCOCET/ROXICET) 5-325 MG per tablet 1-2 tablet (2 tablets Oral Given 09/01/15 2133)  ondansetron  (ZOFRAN) injection 4 mg (4 mg Intravenous Given 08/30/15 1828)  sodium chloride 0.9 % bolus 500 mL (0 mLs Intravenous Stopped 08/30/15 1933)  0.9 %  sodium chloride infusion ( Intravenous Stopped 08/31/15 2102)  oxyCODONE-acetaminophen (PERCOCET/ROXICET) 5-325 MG per tablet 1 tablet (1 tablet Oral Given 08/31/15 0137)  0.9 %  sodium chloride infusion ( Intravenous Stopped 08/31/15 2101)  0.9 %  sodium chloride infusion ( Intravenous New Bag/Given 09/01/15 1334)    Filed Vitals:   08/31/15 2126 09/01/15 0501 09/01/15 1325 09/01/15 2213  BP: 98/63 128/88 113/78 128/86  Pulse: 70 63 66 73  Temp: 98.2 F (36.8 C) 98 F (36.7 C)  56 F (36.7 C) 98.6 F (37 C)  TempSrc: Oral Oral Oral Oral  Resp: 19 18 17 17   Height:      Weight:      SpO2: 100% 100% 100% 100%    Final diagnoses:  Abdominal pain  Acute chest pain  Acute renal failure, unspecified acute renal failure type [facility])    Admission/ observation were discussed with the admitting physician, patient and/or family and they are comfortable with the plan.     Elnora Morrison, MD 09/02/15 Laureen Abrahams

## 2015-08-30 NOTE — ED Notes (Signed)
Pt back to room E42 from CT

## 2015-08-30 NOTE — ED Notes (Signed)
CT called to attempt to make this patient priority for next available CT scanner

## 2015-08-30 NOTE — ED Notes (Signed)
Admitting MD at bedside.

## 2015-08-30 NOTE — ED Notes (Signed)
Patient transported to CT 

## 2015-08-30 NOTE — ED Notes (Signed)
Per EMS - pt from home. EMS called out for CP. Diaphoretic w/ fire, reported having just walked from [facility]. Weak/thready pulse, initial bp 100/70, st at 124bpm. C/o CP, starts in abd around umbilicus and radiates up. Also c/o shortness of breath from pain in abd/chest. Pt came Monday, went to triage and LWBS. No radiation through back/arms/jaw. Pain in abd worse w/ palpation. C/o nausea, denies vomiting/diarrhea. Last bowel movement today. 300cc fluid bolus, 324mg  aspirin. Last BP 92/70. Diabetic, cbg 150. Pt having difficulty picking up prescriptions. Hx prostate cancer.

## 2015-08-31 ENCOUNTER — Observation Stay (HOSPITAL_COMMUNITY): Payer: Medicare Other

## 2015-08-31 DIAGNOSIS — E119 Type 2 diabetes mellitus without complications: Secondary | ICD-10-CM | POA: Diagnosis not present

## 2015-08-31 DIAGNOSIS — N179 Acute kidney failure, unspecified: Secondary | ICD-10-CM | POA: Diagnosis not present

## 2015-08-31 DIAGNOSIS — R079 Chest pain, unspecified: Secondary | ICD-10-CM | POA: Diagnosis not present

## 2015-08-31 DIAGNOSIS — R29810 Facial weakness: Secondary | ICD-10-CM | POA: Diagnosis not present

## 2015-08-31 LAB — GLUCOSE, CAPILLARY
GLUCOSE-CAPILLARY: 119 mg/dL — AB (ref 65–99)
Glucose-Capillary: 101 mg/dL — ABNORMAL HIGH (ref 65–99)
Glucose-Capillary: 119 mg/dL — ABNORMAL HIGH (ref 65–99)
Glucose-Capillary: 152 mg/dL — ABNORMAL HIGH (ref 65–99)

## 2015-08-31 LAB — CBC
HEMATOCRIT: 47 % (ref 39.0–52.0)
Hemoglobin: 16 g/dL (ref 13.0–17.0)
MCH: 30.5 pg (ref 26.0–34.0)
MCHC: 34 g/dL (ref 30.0–36.0)
MCV: 89.7 fL (ref 78.0–100.0)
Platelets: 230 10*3/uL (ref 150–400)
RBC: 5.24 MIL/uL (ref 4.22–5.81)
RDW: 14.5 % (ref 11.5–15.5)
WBC: 8.3 10*3/uL (ref 4.0–10.5)

## 2015-08-31 LAB — TROPONIN I
Troponin I: <0.03 ng/mL (ref <0.031)
Troponin I: 0.03 ng/mL (ref <0.031)

## 2015-08-31 LAB — COMPREHENSIVE METABOLIC PANEL
ALT: 17 U/L (ref 17–63)
ANION GAP: 10 (ref 5–15)
AST: 21 U/L (ref 15–41)
Albumin: 3.7 g/dL (ref 3.5–5.0)
Alkaline Phosphatase: 60 U/L (ref 38–126)
BILIRUBIN TOTAL: 0.7 mg/dL (ref 0.3–1.2)
BUN: 16 mg/dL (ref 6–20)
CALCIUM: 9.1 mg/dL (ref 8.9–10.3)
CO2: 22 mmol/L (ref 22–32)
Chloride: 108 mmol/L (ref 101–111)
Creatinine, Ser: 2.27 mg/dL — ABNORMAL HIGH (ref 0.61–1.24)
GFR calc Af Amer: 33 mL/min — ABNORMAL LOW (ref >60)
GFR, EST NON AFRICAN AMERICAN: 28 mL/min — AB (ref >60)
GLUCOSE: 94 mg/dL (ref 65–99)
Potassium: 3.8 mmol/L (ref 3.5–5.1)
Sodium: 140 mmol/L (ref 135–145)
TOTAL PROTEIN: 6.8 g/dL (ref 6.5–8.1)

## 2015-08-31 LAB — MAGNESIUM: Magnesium: 2.1 mg/dL (ref 1.7–2.4)

## 2015-08-31 MED ORDER — ENOXAPARIN SODIUM 30 MG/0.3ML ~~LOC~~ SOLN
30.0000 mg | SUBCUTANEOUS | Status: DC
  Administered 2015-08-31: 30 mg via SUBCUTANEOUS
  Filled 2015-08-31: qty 0.3

## 2015-08-31 MED ORDER — OXYCODONE-ACETAMINOPHEN 5-325 MG PO TABS
1.0000 | ORAL_TABLET | Freq: Once | ORAL | Status: AC
  Administered 2015-08-31: 1 via ORAL
  Filled 2015-08-31: qty 1

## 2015-08-31 MED ORDER — ZOLPIDEM TARTRATE 5 MG PO TABS
5.0000 mg | ORAL_TABLET | Freq: Every evening | ORAL | Status: DC | PRN
  Administered 2015-08-31 – 2015-09-01 (×2): 5 mg via ORAL
  Filled 2015-08-31 (×2): qty 1

## 2015-08-31 MED ORDER — SODIUM CHLORIDE 0.9 % IV SOLN
Freq: Once | INTRAVENOUS | Status: AC
  Administered 2015-08-31: 11:00:00 via INTRAVENOUS

## 2015-08-31 MED ORDER — ENOXAPARIN SODIUM 40 MG/0.4ML ~~LOC~~ SOLN
40.0000 mg | SUBCUTANEOUS | Status: DC
Start: 2015-08-31 — End: 2015-08-31

## 2015-08-31 MED ORDER — OXYCODONE-ACETAMINOPHEN 5-325 MG PO TABS
1.0000 | ORAL_TABLET | ORAL | Status: DC | PRN
  Administered 2015-08-31 – 2015-09-01 (×5): 1 via ORAL
  Filled 2015-08-31 (×5): qty 1

## 2015-08-31 NOTE — Progress Notes (Signed)
PROGRESS NOTE    Ryan Hopkins  [id number] DOB: [birth year] DOA: 08/30/2015 PCP: No PCP Per Patient Outpatient Specialists:  Brief Narrative:HPI: Ryan Hopkins is a 65 y.o. gentleman with a history of COPD/emphysema, CVA with R sided weakness,, HTN, DM, TIA, and prostate cancer who presents to the ED for evaluation of multiple complaints, reporting that he has had 1.5 weeks of decreased appetite/PO intake, right flank pain as well as periumbilical pain that radiates to his substernal area. He has mild discomfort in his left shoulder as well. He reports profuse diaphoresis today, but no nausea or vomiting. No documented fever. He has had light-headedness, and says speech is more slurred  Assessment & Plan: Abdominal pain and atypical chest pain.  - troponin's negative, CT unremarkable -continue PPI, improved, tolerating diet -EKG unchanged, check ECHO - lactate 1 -supportive care  Facial droop, slurred speech -and residual R side weakness from prior CVA,will check MRI -Ct with prior CVA --Full strength aspirin for now --Hold Ambien  AKI -improving with NS, due to NSIADs, volume depletion -Hold metformin, lisinopril -continue IVF today  Prolonged PR interval on admit EKG --Mag ok, repeat EKG today  H/o CVA -cant tell me if symptoms worsened 1 or 2 weeks ago -continue ASA, FU MRI  DM --SSI, hold metformin  Code Status: FULL CODE Family Communication: none at bedside Disposition Plan: Home pending workup     Subjective:  dizzy, off balance, abd pain Objective: Filed Vitals:   08/30/15 2030 08/30/15 2100 08/30/15 2113 08/31/15 0405  BP: 124/80 119/85 118/83 110/77  Pulse: 88 86 82 76  Temp:   97.8 F (36.6 C) 98.3 F (36.8 C)  TempSrc:   Oral Oral  Resp: 0  16 18  Height:   5\' 7"  (1.702 m)   Weight:   91.989 kg (202 lb 12.8 oz)   SpO2: 97% 99% 100% 98%    Intake/Output Summary (Last 24 hours) at 08/31/15 1004 Last data filed at 08/31/15 0651  Gross  per 24 hour  Intake    120 ml  Output    580 ml  Net   -460 ml   Filed Weights   08/30/15 1752 08/30/15 2113  Weight: 93.441 kg (206 lb) 91.989 kg (202 lb 12.8 oz)    Examination:  General exam: Appears calm and comfortable, speech slurred, facial droop Respiratory system: Clear to auscultation. Respiratory effort normal. Cardiovascular system: S1 & S2 heard, RRR. No JVD, murmurs, rubs, gallops or clicks. No pedal edema. Gastrointestinal system: Abdomen is nondistended, soft and nontender. No organomegaly or masses felt. Normal bowel sounds heard. Central nervous system: Alert and oriented. Mild r sided weakness-? Residual Extremities: Symmetric 5 x 5 power. Skin: No rashes, lesions or ulcers Psychiatry: Judgement and insight appear normal. Mood & affect appropriate.     Data Reviewed: I have personally reviewed following labs and imaging studies  CBC:  Recent Labs Lab 08/28/15 0758 08/30/15 1757 08/31/15 0400  WBC 6.7 12.7* 8.3  HGB 16.3 16.4 16.0  HCT 47.6 47.9 47.0  MCV 89.1 89.9 89.7  PLT 257 250 [id number]   Basic Metabolic Panel:  Recent Labs Lab 08/28/15 0758 08/30/15 1757 08/31/15 0400  NA 140 136 140  K 4.1 4.4 3.8  CL 108 105 108  CO2 23 15* 22  GLUCOSE 102* 126* 94  BUN 14 19 16   CREATININE 1.60* 3.16* 2.27*  CALCIUM 9.3 9.6 9.1  MG  --   --  2.1   GFR: Estimated Creatinine Clearance: 34.6  mL/min (by C-G formula based on Cr of 2.27). Liver Function Tests:  Recent Labs Lab 08/28/15 0758 08/30/15 1757 08/31/15 0400  AST 21 65* 21  ALT 17 26 17   ALKPHOS 68 59 60  BILITOT 0.5 2.5* 0.7  PROT 7.1 >12.0* 6.8  ALBUMIN 3.7 3.7 3.7    Recent Labs Lab 08/28/15 0758 08/30/15 1844  LIPASE 67* 43   No results for input(s): AMMONIA in the last 168 hours. Coagulation Profile: No results for input(s): INR, PROTIME in the last 168 hours. Cardiac Enzymes:  Recent Labs Lab 08/30/15 2157 08/31/15 0100 08/31/15 0400  TROPONINI <0.03 <0.03 0.03    BNP (last 3 results) No results for input(s): PROBNP in the last 8760 hours. HbA1C: No results for input(s): HGBA1C in the last 72 hours. CBG:  Recent Labs Lab 08/28/15 0753 08/30/15 2244 08/31/15 0636  GLUCAP 106* 136* 101*   Lipid Profile: No results for input(s): CHOL, HDL, LDLCALC, TRIG, CHOLHDL, LDLDIRECT in the last 72 hours. Thyroid Function Tests: No results for input(s): TSH, T4TOTAL, FREET4, T3FREE, THYROIDAB in the last 72 hours. Anemia Panel: No results for input(s): VITAMINB12, FOLATE, FERRITIN, TIBC, IRON, RETICCTPCT in the last 72 hours. Urine analysis:    Component Value Date/Time   COLORURINE YELLOW 08/30/2015 2019   APPEARANCEUR CLEAR 08/30/2015 2019   LABSPEC 1.014 08/30/2015 2019   PHURINE 5.0 08/30/2015 2019   GLUCOSEU NEGATIVE 08/30/2015 2019   HGBUR NEGATIVE 08/30/2015 2019   BILIRUBINUR NEGATIVE 08/30/2015 2019   KETONESUR 15* 08/30/2015 2019   PROTEINUR NEGATIVE 08/30/2015 2019   UROBILINOGEN 1.0 01/06/2015 1839   NITRITE NEGATIVE 08/30/2015 2019   LEUKOCYTESUR NEGATIVE 08/30/2015 2019   Sepsis Labs: @LABRCNTIP (procalcitonin:4,lacticidven:4)  )No results found for this or any previous visit (from the past 240 hour(s)).       Radiology Studies: Ct Abdomen Pelvis Wo Contrast  08/30/2015  CLINICAL DATA:  Severe central abdominal pain radiating to chest and back. Umbilical area pain radiating up to chest with shortness of breath. EXAM: CT CHEST, ABDOMEN AND PELVIS WITHOUT CONTRAST TECHNIQUE: Multidetector CT imaging of the chest, abdomen and pelvis was performed following the standard protocol without IV contrast. COMPARISON:  CT abdomen dated 01/06/2015. FINDINGS: CT CHEST FINDINGS Mediastinum/Lymph Nodes: Evaluation of the thoracic aorta is limited without intravascular contrast. Thoracic aorta is normal in caliber. No evidence of intramural hematoma. No hemorrhage or edema within the mediastinum. Heart size is normal. No pericardial effusion.  Lungs/Pleura: Lungs are clear. No consolidation or pleural effusion. No pneumothorax. Trachea and central bronchi are unremarkable. Musculoskeletal: No acute or suspicious osseous lesion. Mild degenerative change noted within the thoracic spine. Superficial soft tissues about the chest are unremarkable. CT ABDOMEN PELVIS FINDINGS Hepatobiliary: No mass visualize within the liver on this unenhanced exam. Gallbladder is unremarkable. No bile duct dilatation seen. Pancreas: No mass or inflammatory process identified on this un-enhanced exam. Spleen: Within normal limits in size. Adrenals/Urinary Tract: Kidneys are unremarkable without stone or hydronephrosis. No ureteral or bladder calculi identified. Bladder appears normal. No bladder stone. Adrenal glands appear normal. Stomach/Bowel: Bowel is normal in caliber. No bowel wall thickening or evidence of bowel wall inflammation seen. Appendix is normal. Stomach appears normal. Vascular/Lymphatic: Abdominal aorta is normal in caliber. No enlarged lymph nodes seen within the abdomen or pelvis. Reproductive: No mass or other significant abnormality. Other: No free fluid or abscess collections seen. No free intraperitoneal air. Musculoskeletal: No acute or suspicious osseous lesion. Mild degenerative change noted within the lumbar spine. Superficial soft  tissues are unremarkable. IMPRESSION: No acute findings within the chest, abdomen or pelvis on this noncontrast CT examination. Mild degenerative change within the thoracic and lumbar spine. Electronically Signed   By: Franki Cabot M.D.   On: 08/30/2015 20:15   Ct Head Wo Contrast  08/31/2015  CLINICAL DATA:  Right facial droop EXAM: CT HEAD WITHOUT CONTRAST TECHNIQUE: Contiguous axial images were obtained from the base of the skull through the vertex without intravenous contrast. COMPARISON:  01/06/2015 FINDINGS: Mild diffuse cortical atrophy. 2.5 cm focus of low attenuation deep periventricular white matter on the  right, identical to prior study. No evidence of acute vascular territory infarct. No hemorrhage or extra-axial fluid. No hydrocephalus. Both middle cerebral arteries appear relatively hyper attenuating, left more so than right. This appearance is unchanged from the prior study. There are no findings of infarction in the middle cerebral artery territories. The calvarium is intact. Mild chronic inflammatory change in the maxillary sinuses bilaterally, with air-fluid level in right maxillary sinus similar to prior study. IMPRESSION: Stable chronic right basal ganglia infarct. No acute intracranial abnormality. Sinusitis. Electronically Signed   By: Skipper Cliche M.D.   On: 08/31/2015 08:25   Ct Chest Wo Contrast  08/30/2015  CLINICAL DATA:  Severe central abdominal pain radiating to chest and back. Umbilical area pain radiating up to chest with shortness of breath. EXAM: CT CHEST, ABDOMEN AND PELVIS WITHOUT CONTRAST TECHNIQUE: Multidetector CT imaging of the chest, abdomen and pelvis was performed following the standard protocol without IV contrast. COMPARISON:  CT abdomen dated 01/06/2015. FINDINGS: CT CHEST FINDINGS Mediastinum/Lymph Nodes: Evaluation of the thoracic aorta is limited without intravascular contrast. Thoracic aorta is normal in caliber. No evidence of intramural hematoma. No hemorrhage or edema within the mediastinum. Heart size is normal. No pericardial effusion. Lungs/Pleura: Lungs are clear. No consolidation or pleural effusion. No pneumothorax. Trachea and central bronchi are unremarkable. Musculoskeletal: No acute or suspicious osseous lesion. Mild degenerative change noted within the thoracic spine. Superficial soft tissues about the chest are unremarkable. CT ABDOMEN PELVIS FINDINGS Hepatobiliary: No mass visualize within the liver on this unenhanced exam. Gallbladder is unremarkable. No bile duct dilatation seen. Pancreas: No mass or inflammatory process identified on this un-enhanced  exam. Spleen: Within normal limits in size. Adrenals/Urinary Tract: Kidneys are unremarkable without stone or hydronephrosis. No ureteral or bladder calculi identified. Bladder appears normal. No bladder stone. Adrenal glands appear normal. Stomach/Bowel: Bowel is normal in caliber. No bowel wall thickening or evidence of bowel wall inflammation seen. Appendix is normal. Stomach appears normal. Vascular/Lymphatic: Abdominal aorta is normal in caliber. No enlarged lymph nodes seen within the abdomen or pelvis. Reproductive: No mass or other significant abnormality. Other: No free fluid or abscess collections seen. No free intraperitoneal air. Musculoskeletal: No acute or suspicious osseous lesion. Mild degenerative change noted within the lumbar spine. Superficial soft tissues are unremarkable. IMPRESSION: No acute findings within the chest, abdomen or pelvis on this noncontrast CT examination. Mild degenerative change within the thoracic and lumbar spine. Electronically Signed   By: Franki Cabot M.D.   On: 08/30/2015 20:15   Dg Chest Portable 1 View  08/30/2015  CLINICAL DATA:  Chest pain. EXAM: PORTABLE CHEST 1 VIEW COMPARISON:  March 14, 2014. FINDINGS: The heart size and mediastinal contours are within normal limits. Both lungs are clear. No pneumothorax or pleural effusion is noted. The visualized skeletal structures are unremarkable. IMPRESSION: No acute cardiopulmonary abnormality seen. Electronically Signed   By: Marijo Conception, M.D.  On: 08/30/2015 18:45        Scheduled Meds: . sodium chloride   Intravenous STAT  . aspirin EC  325 mg Oral Daily  . insulin aspart  0-9 Units Subcutaneous TID WC  . pantoprazole  40 mg Oral Daily   Continuous Infusions:       Time spent: 70min    Domenic Polite, MD [facility] Pager [phone]  If 7PM-7AM, please contact night-coverage [email or web address] Password [email or web address] 08/31/2015, 10:04 AM

## 2015-09-01 DIAGNOSIS — R079 Chest pain, unspecified: Secondary | ICD-10-CM

## 2015-09-01 DIAGNOSIS — N179 Acute kidney failure, unspecified: Secondary | ICD-10-CM | POA: Diagnosis not present

## 2015-09-01 LAB — CBC
HCT: 45 % (ref 39.0–52.0)
HEMOGLOBIN: 15.2 g/dL (ref 13.0–17.0)
MCH: 30.7 pg (ref 26.0–34.0)
MCHC: 33.8 g/dL (ref 30.0–36.0)
MCV: 90.9 fL (ref 78.0–100.0)
Platelets: 232 10*3/uL (ref 150–400)
RBC: 4.95 MIL/uL (ref 4.22–5.81)
RDW: 14.6 % (ref 11.5–15.5)
WBC: 7.2 10*3/uL (ref 4.0–10.5)

## 2015-09-01 LAB — BASIC METABOLIC PANEL
Anion gap: 7 (ref 5–15)
BUN: 17 mg/dL (ref 6–20)
CHLORIDE: 109 mmol/L (ref 101–111)
CO2: 23 mmol/L (ref 22–32)
CREATININE: 1.63 mg/dL — AB (ref 0.61–1.24)
Calcium: 8.9 mg/dL (ref 8.9–10.3)
GFR calc Af Amer: 49 mL/min — ABNORMAL LOW (ref >60)
GFR calc non Af Amer: 42 mL/min — ABNORMAL LOW (ref >60)
GLUCOSE: 119 mg/dL — AB (ref 65–99)
POTASSIUM: 4.1 mmol/L (ref 3.5–5.1)
SODIUM: 139 mmol/L (ref 135–145)

## 2015-09-01 LAB — GLUCOSE, CAPILLARY
GLUCOSE-CAPILLARY: 115 mg/dL — AB (ref 65–99)
Glucose-Capillary: 114 mg/dL — ABNORMAL HIGH (ref 65–99)
Glucose-Capillary: 119 mg/dL — ABNORMAL HIGH (ref 65–99)
Glucose-Capillary: 121 mg/dL — ABNORMAL HIGH (ref 65–99)

## 2015-09-01 LAB — TSH: TSH: 1.094 u[IU]/mL (ref 0.350–4.500)

## 2015-09-01 MED ORDER — ENOXAPARIN SODIUM 40 MG/0.4ML ~~LOC~~ SOLN
40.0000 mg | SUBCUTANEOUS | Status: DC
  Administered 2015-09-01: 40 mg via SUBCUTANEOUS
  Filled 2015-09-01: qty 0.4

## 2015-09-01 MED ORDER — NICOTINE 21 MG/24HR TD PT24
21.0000 mg | MEDICATED_PATCH | Freq: Every day | TRANSDERMAL | Status: DC
  Administered 2015-09-01 – 2015-09-02 (×2): 21 mg via TRANSDERMAL
  Filled 2015-09-01 (×2): qty 1

## 2015-09-01 MED ORDER — SODIUM CHLORIDE 0.9 % IV SOLN
Freq: Once | INTRAVENOUS | Status: AC
  Administered 2015-09-01: 14:00:00 via INTRAVENOUS

## 2015-09-01 MED ORDER — OXYCODONE-ACETAMINOPHEN 5-325 MG PO TABS
1.0000 | ORAL_TABLET | ORAL | Status: DC | PRN
  Administered 2015-09-01 – 2015-09-02 (×6): 2 via ORAL
  Filled 2015-09-01 (×6): qty 2

## 2015-09-01 NOTE — Progress Notes (Addendum)
PROGRESS NOTE    Ryan Hopkins  [id number] DOB: 05-Sep-1950 DOA: 08/30/2015 PCP: No PCP Per Patient   Outpatient Specialists:    Brief Narrative:HPI: Ryan Hopkins is a 65 y.o. gentleman with a history of COPD/emphysema, CVA with R sided weakness,, HTN, DM, TIA, and prostate cancer who presents to the ED for evaluation of multiple complaints, reporting that he has had 1.5 weeks of decreased appetite/PO intake, right flank pain as well as periumbilical pain that radiates to his substernal area. He has mild discomfort in his left shoulder as well. He reports profuse diaphoresis today, but no nausea or vomiting. No documented fever. He has had light-headedness, and says speech is more slurred   Assessment & Plan: Abdominal pain and atypical chest pain.  - troponin's negative, CT No acute findings within the chest, abdomen or pelvis on this noncontrast CT examination. -continue PPI, improved, tolerating diet -EKG unchanged,  ECHO pending    Generalized weakness Likely secondary to acute kidney injury   Facial droop, slurred speech -and residual R side weakness from prior CVA,MRI brain No acute infarct. Chronic right basal ganglia infarct with mild chronic small vessel ischemic disease elsewhere. -Ct with prior CVA --Full strength aspirin for now --Hold Ambien   AKI, Baseline 1.6, presented with a creatinine of 3.16, improving  -improving with NS, due to NSIADs, volume depletion -Hold metformin, lisinopril -continue IVF today  Prolonged PR interval on admit EKG --Mag ok, repeat EKG today  H/o CVA -cant tell me if symptoms worsened 1 or 2 weeks ago -continue ASA, FU MRI  DM --SSI, hold metformin  Nicotine dependence ; start nicotine patch  Code Status: FULL CODE Family Communication: none at bedside Disposition Plan: Home pending workup, PT OT evaluation      Subjective: Hypotensive , uncontrolled pain,   Objective: Filed Vitals:   08/31/15 1503  08/31/15 1507 08/31/15 2126 09/01/15 0501  BP: 89/58 109/79 98/63 128/88  Pulse: 73  70 63  Temp: 98.2 F (36.8 C)  98.2 F (36.8 C) 98 F (36.7 C)  TempSrc: Oral  Oral Oral  Resp: 19  19 18   Height:      Weight:      SpO2: 96%  100% 100%   No intake or output data in the 24 hours ending 09/01/15 1304 Filed Weights   08/30/15 1752 08/30/15 2113  Weight: 93.441 kg (206 lb) 91.989 kg (202 lb 12.8 oz)    Examination:  General exam: Appears calm and comfortable, speech slurred, facial droop Respiratory system: Clear to auscultation. Respiratory effort normal. Cardiovascular system: S1 & S2 heard, RRR. No JVD, murmurs, rubs, gallops or clicks. No pedal edema. Gastrointestinal system: Abdomen is nondistended, soft and nontender. No organomegaly or masses felt. Normal bowel sounds heard. Central nervous system: Alert and oriented. Mild r sided weakness-? Residual Extremities: Symmetric 5 x 5 power. Skin: No rashes, lesions or ulcers Psychiatry: Judgement and insight appear normal. Mood & affect appropriate.     Data Reviewed: I have personally reviewed following labs and imaging studies  CBC:  Recent Labs Lab 08/28/15 0758 08/30/15 1757 08/31/15 0400 09/01/15 0320  WBC 6.7 12.7* 8.3 7.2  HGB 16.3 16.4 16.0 15.2  HCT 47.6 47.9 47.0 45.0  MCV 89.1 89.9 89.7 90.9  PLT 257 250 230 [id number]   Basic Metabolic Panel:  Recent Labs Lab 08/28/15 0758 08/30/15 1757 08/31/15 0400 09/01/15 0320  NA 140 136 140 139  K 4.1 4.4 3.8 4.1  CL 108 105 108 109  CO2 23 15* 22 23  GLUCOSE 102* 126* 94 119*  BUN 14 19 16 17   CREATININE 1.60* 3.16* 2.27* 1.63*  CALCIUM 9.3 9.6 9.1 8.9  MG  --   --  2.1  --    GFR: Estimated Creatinine Clearance: 48.2 mL/min (by C-G formula based on Cr of 1.63). Liver Function Tests:  Recent Labs Lab 08/28/15 0758 08/30/15 1757 08/31/15 0400  AST 21 65* 21  ALT 17 26 17   ALKPHOS 68 59 60  BILITOT 0.5 2.5* 0.7  PROT 7.1 >12.0* 6.8  ALBUMIN  3.7 3.7 3.7    Recent Labs Lab 08/28/15 0758 08/30/15 1844  LIPASE 67* 43   No results for input(s): AMMONIA in the last 168 hours. Coagulation Profile: No results for input(s): INR, PROTIME in the last 168 hours. Cardiac Enzymes:  Recent Labs Lab 08/30/15 2157 08/31/15 0100 08/31/15 0400  TROPONINI <0.03 <0.03 0.03   BNP (last 3 results) No results for input(s): PROBNP in the last 8760 hours. HbA1C: No results for input(s): HGBA1C in the last 72 hours. CBG:  Recent Labs Lab 08/31/15 1129 08/31/15 1620 08/31/15 2123 09/01/15 0633 09/01/15 1134  GLUCAP 119* 152* 119* 114* 119*   Lipid Profile: No results for input(s): CHOL, HDL, LDLCALC, TRIG, CHOLHDL, LDLDIRECT in the last 72 hours. Thyroid Function Tests: No results for input(s): TSH, T4TOTAL, FREET4, T3FREE, THYROIDAB in the last 72 hours. Anemia Panel: No results for input(s): VITAMINB12, FOLATE, FERRITIN, TIBC, IRON, RETICCTPCT in the last 72 hours. Urine analysis:    Component Value Date/Time   COLORURINE YELLOW 08/30/2015 2019   APPEARANCEUR CLEAR 08/30/2015 2019   LABSPEC 1.014 08/30/2015 2019   PHURINE 5.0 08/30/2015 2019   GLUCOSEU NEGATIVE 08/30/2015 2019   HGBUR NEGATIVE 08/30/2015 2019   BILIRUBINUR NEGATIVE 08/30/2015 2019   KETONESUR 15* 08/30/2015 2019   PROTEINUR NEGATIVE 08/30/2015 2019   UROBILINOGEN 1.0 01/06/2015 1839   NITRITE NEGATIVE 08/30/2015 2019   LEUKOCYTESUR NEGATIVE 08/30/2015 2019   Sepsis Labs: @LABRCNTIP (procalcitonin:4,lacticidven:4)  )No results found for this or any previous visit (from the past 240 hour(s)).       Radiology Studies: Ct Abdomen Pelvis Wo Contrast  08/30/2015  CLINICAL DATA:  Severe central abdominal pain radiating to chest and back. Umbilical area pain radiating up to chest with shortness of breath. EXAM: CT CHEST, ABDOMEN AND PELVIS WITHOUT CONTRAST TECHNIQUE: Multidetector CT imaging of the chest, abdomen and pelvis was performed following the  standard protocol without IV contrast. COMPARISON:  CT abdomen dated 01/06/2015. FINDINGS: CT CHEST FINDINGS Mediastinum/Lymph Nodes: Evaluation of the thoracic aorta is limited without intravascular contrast. Thoracic aorta is normal in caliber. No evidence of intramural hematoma. No hemorrhage or edema within the mediastinum. Heart size is normal. No pericardial effusion. Lungs/Pleura: Lungs are clear. No consolidation or pleural effusion. No pneumothorax. Trachea and central bronchi are unremarkable. Musculoskeletal: No acute or suspicious osseous lesion. Mild degenerative change noted within the thoracic spine. Superficial soft tissues about the chest are unremarkable. CT ABDOMEN PELVIS FINDINGS Hepatobiliary: No mass visualize within the liver on this unenhanced exam. Gallbladder is unremarkable. No bile duct dilatation seen. Pancreas: No mass or inflammatory process identified on this un-enhanced exam. Spleen: Within normal limits in size. Adrenals/Urinary Tract: Kidneys are unremarkable without stone or hydronephrosis. No ureteral or bladder calculi identified. Bladder appears normal. No bladder stone. Adrenal glands appear normal. Stomach/Bowel: Bowel is normal in caliber. No bowel wall thickening or evidence of bowel wall inflammation seen. Appendix is normal. Stomach appears  normal. Vascular/Lymphatic: Abdominal aorta is normal in caliber. No enlarged lymph nodes seen within the abdomen or pelvis. Reproductive: No mass or other significant abnormality. Other: No free fluid or abscess collections seen. No free intraperitoneal air. Musculoskeletal: No acute or suspicious osseous lesion. Mild degenerative change noted within the lumbar spine. Superficial soft tissues are unremarkable. IMPRESSION: No acute findings within the chest, abdomen or pelvis on this noncontrast CT examination. Mild degenerative change within the thoracic and lumbar spine. Electronically Signed   By: Franki Cabot M.D.   On: 08/30/2015  20:15   Ct Head Wo Contrast  08/31/2015  CLINICAL DATA:  Right facial droop EXAM: CT HEAD WITHOUT CONTRAST TECHNIQUE: Contiguous axial images were obtained from the base of the skull through the vertex without intravenous contrast. COMPARISON:  01/06/2015 FINDINGS: Mild diffuse cortical atrophy. 2.5 cm focus of low attenuation deep periventricular white matter on the right, identical to prior study. No evidence of acute vascular territory infarct. No hemorrhage or extra-axial fluid. No hydrocephalus. Both middle cerebral arteries appear relatively hyper attenuating, left more so than right. This appearance is unchanged from the prior study. There are no findings of infarction in the middle cerebral artery territories. The calvarium is intact. Mild chronic inflammatory change in the maxillary sinuses bilaterally, with air-fluid level in right maxillary sinus similar to prior study. IMPRESSION: Stable chronic right basal ganglia infarct. No acute intracranial abnormality. Sinusitis. Electronically Signed   By: Skipper Cliche M.D.   On: 08/31/2015 08:25   Ct Chest Wo Contrast  08/30/2015  CLINICAL DATA:  Severe central abdominal pain radiating to chest and back. Umbilical area pain radiating up to chest with shortness of breath. EXAM: CT CHEST, ABDOMEN AND PELVIS WITHOUT CONTRAST TECHNIQUE: Multidetector CT imaging of the chest, abdomen and pelvis was performed following the standard protocol without IV contrast. COMPARISON:  CT abdomen dated 01/06/2015. FINDINGS: CT CHEST FINDINGS Mediastinum/Lymph Nodes: Evaluation of the thoracic aorta is limited without intravascular contrast. Thoracic aorta is normal in caliber. No evidence of intramural hematoma. No hemorrhage or edema within the mediastinum. Heart size is normal. No pericardial effusion. Lungs/Pleura: Lungs are clear. No consolidation or pleural effusion. No pneumothorax. Trachea and central bronchi are unremarkable. Musculoskeletal: No acute or  suspicious osseous lesion. Mild degenerative change noted within the thoracic spine. Superficial soft tissues about the chest are unremarkable. CT ABDOMEN PELVIS FINDINGS Hepatobiliary: No mass visualize within the liver on this unenhanced exam. Gallbladder is unremarkable. No bile duct dilatation seen. Pancreas: No mass or inflammatory process identified on this un-enhanced exam. Spleen: Within normal limits in size. Adrenals/Urinary Tract: Kidneys are unremarkable without stone or hydronephrosis. No ureteral or bladder calculi identified. Bladder appears normal. No bladder stone. Adrenal glands appear normal. Stomach/Bowel: Bowel is normal in caliber. No bowel wall thickening or evidence of bowel wall inflammation seen. Appendix is normal. Stomach appears normal. Vascular/Lymphatic: Abdominal aorta is normal in caliber. No enlarged lymph nodes seen within the abdomen or pelvis. Reproductive: No mass or other significant abnormality. Other: No free fluid or abscess collections seen. No free intraperitoneal air. Musculoskeletal: No acute or suspicious osseous lesion. Mild degenerative change noted within the lumbar spine. Superficial soft tissues are unremarkable. IMPRESSION: No acute findings within the chest, abdomen or pelvis on this noncontrast CT examination. Mild degenerative change within the thoracic and lumbar spine. Electronically Signed   By: Franki Cabot M.D.   On: 08/30/2015 20:15   Mr Brain Wo Contrast  08/31/2015  CLINICAL DATA:  Slurred speech, gait  disturbance, and worsening facial droop. EXAM: MRI HEAD WITHOUT CONTRAST TECHNIQUE: Multiplanar, multiecho pulse sequences of the brain and surrounding structures were obtained without intravenous contrast. COMPARISON:  Head CT 08/31/2015 FINDINGS: Multiple sequences are mildly motion degraded, and sagittal T1 images are moderately to severely degraded. The patient could not tolerate the complete examination, and T2* gradient echo and axial T1 images  were not obtained. There is no evidence of acute infarct, mass, midline shift, or extra-axial fluid collection. There is mild global cerebral atrophy. A chronic right basal ganglia infarct is present with ex vacuo dilatation of the right lateral ventricle and associated chronic blood products. Periventricular white matter T2 hyperintensities elsewhere are compatible with mild chronic small vessel ischemic disease. Mild bilateral maxillary sinus mucosal thickening and small volume right maxillary sinus fluid are present. Depression of the right lamina papyracea may reflect an old medial orbital blowout fracture. Globes appear intact. There is a trace left mastoid effusion. The distal right vertebral artery is not well seen and may be hypoplastic. Other major intracranial vascular flow voids are preserved. IMPRESSION: 1. Motion degraded, incomplete examination.  No acute infarct. 2. Chronic right basal ganglia infarct with mild chronic small vessel ischemic disease elsewhere. Electronically Signed   By: Logan Bores M.D.   On: 08/31/2015 16:21   Dg Chest Portable 1 View  08/30/2015  CLINICAL DATA:  Chest pain. EXAM: PORTABLE CHEST 1 VIEW COMPARISON:  March 14, 2014. FINDINGS: The heart size and mediastinal contours are within normal limits. Both lungs are clear. No pneumothorax or pleural effusion is noted. The visualized skeletal structures are unremarkable. IMPRESSION: No acute cardiopulmonary abnormality seen. Electronically Signed   By: Marijo Conception, M.D.   On: 08/30/2015 18:45        Scheduled Meds: . aspirin EC  325 mg Oral Daily  . enoxaparin (LOVENOX) injection  40 mg Subcutaneous Q24H  . insulin aspart  0-9 Units Subcutaneous TID WC  . pantoprazole  40 mg Oral Daily   Continuous Infusions:       Time spent: 35min    Reyne Dumas , MD [facility] Pager [phone]  If 7PM-7AM, please contact night-coverage [email or web address] Password [email or web address] 09/01/2015, 1:04 PM

## 2015-09-02 ENCOUNTER — Observation Stay (HOSPITAL_BASED_OUTPATIENT_CLINIC_OR_DEPARTMENT_OTHER): Payer: Medicare Other

## 2015-09-02 DIAGNOSIS — R0600 Dyspnea, unspecified: Secondary | ICD-10-CM | POA: Diagnosis not present

## 2015-09-02 DIAGNOSIS — N179 Acute kidney failure, unspecified: Secondary | ICD-10-CM | POA: Diagnosis not present

## 2015-09-02 DIAGNOSIS — R079 Chest pain, unspecified: Secondary | ICD-10-CM | POA: Diagnosis not present

## 2015-09-02 LAB — CBC
HEMATOCRIT: 41.6 % (ref 39.0–52.0)
HEMOGLOBIN: 14.2 g/dL (ref 13.0–17.0)
MCH: 31 pg (ref 26.0–34.0)
MCHC: 34.1 g/dL (ref 30.0–36.0)
MCV: 90.8 fL (ref 78.0–100.0)
Platelets: 219 10*3/uL (ref 150–400)
RBC: 4.58 MIL/uL (ref 4.22–5.81)
RDW: 14.2 % (ref 11.5–15.5)
WBC: 6.2 10*3/uL (ref 4.0–10.5)

## 2015-09-02 LAB — COMPREHENSIVE METABOLIC PANEL
ALBUMIN: 3.1 g/dL — AB (ref 3.5–5.0)
ALT: 15 U/L — ABNORMAL LOW (ref 17–63)
ANION GAP: 13 (ref 5–15)
AST: 19 U/L (ref 15–41)
Alkaline Phosphatase: 48 U/L (ref 38–126)
BUN: 16 mg/dL (ref 6–20)
CALCIUM: 8.5 mg/dL — AB (ref 8.9–10.3)
CHLORIDE: 110 mmol/L (ref 101–111)
CO2: 17 mmol/L — AB (ref 22–32)
Creatinine, Ser: 1.4 mg/dL — ABNORMAL HIGH (ref 0.61–1.24)
GFR calc non Af Amer: 51 mL/min — ABNORMAL LOW (ref >60)
GFR, EST AFRICAN AMERICAN: 59 mL/min — AB (ref >60)
Glucose, Bld: 106 mg/dL — ABNORMAL HIGH (ref 65–99)
POTASSIUM: 4.5 mmol/L (ref 3.5–5.1)
SODIUM: 140 mmol/L (ref 135–145)
Total Bilirubin: 0.2 mg/dL — ABNORMAL LOW (ref 0.3–1.2)
Total Protein: 5.6 g/dL — ABNORMAL LOW (ref 6.5–8.1)

## 2015-09-02 LAB — HEMOGLOBIN A1C
Hgb A1c MFr Bld: 6.3 % — ABNORMAL HIGH (ref 4.8–5.6)
Mean Plasma Glucose: 134 mg/dL

## 2015-09-02 LAB — GLUCOSE, CAPILLARY
GLUCOSE-CAPILLARY: 106 mg/dL — AB (ref 65–99)
Glucose-Capillary: 94 mg/dL (ref 65–99)
Glucose-Capillary: 117 mg/dL — ABNORMAL HIGH (ref 65–99)

## 2015-09-02 LAB — ECHOCARDIOGRAM COMPLETE
Height: 67 in
WEIGHTICAEL: 3244.8 [oz_av]

## 2015-09-02 MED ORDER — LISINOPRIL 20 MG PO TABS: 20.0000 mg | ORAL_TABLET | Freq: Every day | ORAL | Status: DC

## 2015-09-02 MED ORDER — METFORMIN HCL 500 MG PO TABS: 500.0000 mg | ORAL_TABLET | Freq: Two times a day (BID) | ORAL | Status: AC

## 2015-09-02 MED ORDER — ASPIRIN 325 MG PO TBEC: 325.0000 mg | DELAYED_RELEASE_TABLET | Freq: Every day | ORAL | Status: DC

## 2015-09-02 MED ORDER — PANTOPRAZOLE SODIUM 40 MG PO TBEC: 40.0000 mg | DELAYED_RELEASE_TABLET | Freq: Every day | ORAL | Status: DC

## 2015-09-02 MED ORDER — NICOTINE 21 MG/24HR TD PT24: 21.0000 mg | MEDICATED_PATCH | Freq: Every day | TRANSDERMAL | Status: DC

## 2015-09-02 NOTE — Care Management Note (Signed)
Case Management Note Marvetta Gibbons RN, BSN Unit 2W-Case Manager [phone]  Patient Details  Name: Ryan Hopkins MRN: [id number] Date of Birth: 1951-02-25  Subjective/Objective:    Pt admitted with AKI                Action/Plan: PTA pt lived at home- per conversation with pt he uses a cane- pt states that he had an appointment with [facility]- but missed it last week due to his sister passing- wanted the number to call and reschedule- # provided to pt on the discharge AVS- CM to continue to follow for any further d/c needs  Expected Discharge Date:                  Expected Discharge Plan:  Home/Self Care  In-House Referral:     Discharge planning Services  CM Consult  Post Acute Care Choice:    Choice offered to:     DME Arranged:    DME Agency:     HH Arranged:    [place] Agency:     Status of Service:  Completed, signed off  Medicare Important Message Given:    Date Medicare IM Given:    Medicare IM give by:    Date Additional Medicare IM Given:    Additional Medicare Important Message give by:     If discussed at [place] of Stay Meetings, dates discussed:    Additional Comments:  Dawayne Patricia, RN 09/02/2015, 11:43 AM

## 2015-09-02 NOTE — Evaluation (Signed)
Physical Therapy Evaluation Patient Details Name: Ryan Hopkins MRN: [id number] DOB: 1951/03/10 Today's Date: 09/02/2015   History of Present Illness  Pt adm with acute kidney injury. PMH - lt CVA, DM, HTN, COPD, prostate CA  Clinical Impression  Pt doing well with mobility and no further PT needed.  Ready for dc from PT standpoint.      Follow Up Recommendations No PT follow up    Equipment Recommendations  None recommended by PT    Recommendations for Other Services       Precautions / Restrictions Precautions Precautions: None      Mobility  Bed Mobility Overal bed mobility: Independent                Transfers Overall transfer level: Independent                  Ambulation/Gait Ambulation/Gait assistance: Modified independent (Device/Increase time) Ambulation Distance (Feet): 400 Feet Assistive device: Straight cane Gait Pattern/deviations: WFL(Within Functional Limits) Gait velocity: decr Gait velocity interpretation: Below normal speed for age/gender General Gait Details: steady gait  Stairs            Wheelchair Mobility    Modified Rankin (Stroke Patients Only)       Balance Overall balance assessment: Modified Independent                                           Pertinent Vitals/Pain Pain Assessment: No/denies pain    Home Living Family/patient expects to be discharged to:: Private residence                      Prior Function Level of Independence: Independent with assistive device(s)         Comments: uses cane     Hand Dominance        Extremity/Trunk Assessment   Upper Extremity Assessment: Overall WFL for tasks assessed           Lower Extremity Assessment: Overall WFL for tasks assessed         Communication   Communication: No difficulties  Cognition Arousal/Alertness: Awake/alert Behavior During Therapy: WFL for tasks assessed/performed Overall Cognitive Status:  Within Functional Limits for tasks assessed                      General Comments      Exercises        Assessment/Plan    PT Assessment Patent does not need any further PT services  PT Diagnosis Generalized weakness   PT Problem List    PT Treatment Interventions     PT Goals (Current goals can be found in the Care Plan section) Acute Rehab PT Goals PT Goal Formulation: All assessment and education complete, DC therapy    Frequency     Barriers to discharge        Co-evaluation               End of Session   Activity Tolerance: Patient tolerated treatment well Patient left: in bed;with call bell/phone within reach (sitting EOB) Nurse Communication: Mobility status    Functional Assessment Tool Used: clinical judgement Functional Limitation: Mobility: Walking and moving around Mobility: Walking and Moving Around Current Status [id number]): At least 1 percent but less than 20 percent impaired, limited or restricted Mobility: Walking and Moving Around Goal Status [phone]):  At least 1 percent but less than 20 percent impaired, limited or restricted Mobility: Walking and Moving Around Discharge Status [phone]): At least 1 percent but less than 20 percent impaired, limited or restricted    Time: 1255-1311 PT Time Calculation (min) (ACUTE ONLY): 16 min   Charges:   PT Evaluation $PT Eval Low Complexity: 1 Procedure     PT G Codes:   PT G-Codes **NOT FOR INPATIENT CLASS** Functional Assessment Tool Used: clinical judgement Functional Limitation: Mobility: Walking and moving around Mobility: Walking and Moving Around Current Status [id number]): At least 1 percent but less than 20 percent impaired, limited or restricted Mobility: Walking and Moving Around Goal Status [phone]): At least 1 percent but less than 20 percent impaired, limited or restricted Mobility: Walking and Moving Around Discharge Status [phone]): At least 1 percent but less than 20 percent impaired,  limited or restricted    [facility] 09/02/2015, 2:29 PM [facility] PT [phone]

## 2015-09-02 NOTE — Progress Notes (Signed)
  Echocardiogram 2D Echocardiogram has been performed.  Ryan Hopkins M 09/02/2015, 9:17 AM

## 2015-09-02 NOTE — Progress Notes (Signed)
Physician Discharge Summary  Ryan Hopkins MRN: [id number] DOB/AGE: December 14, 1950 65 y.o.  PCP: No PCP Per Patient   Admit date: 08/30/2015 Discharge date: 09/02/2015  Discharge Diagnoses:     Principal Problem:   AKI (acute kidney injury) ([place]) Active Problems:   Chest pain   Facial droop   DM (diabetes mellitus) ([place])   Acute chest pain    Follow-up recommendations Follow-up with PCP in 3-5 days , including all  additional recommended appointments as below Follow-up CBC, CMP in 3-5 days Patient referred to be set up for outpatient stress test      Current Discharge Medication List    START taking these medications   Details  aspirin EC 325 MG EC tablet Take 1 tablet (325 mg total) by mouth daily. Qty: 30 tablet, Refills: 0    nicotine (NICODERM CQ - DOSED IN MG/24 HOURS) 21 mg/24hr patch Place 1 patch (21 mg total) onto the skin daily. Qty: 28 patch, Refills: 0    pantoprazole (PROTONIX) 40 MG tablet Take 1 tablet (40 mg total) by mouth daily. Qty: 30 tablet, Refills: 0      CONTINUE these medications which have CHANGED   Details  lisinopril (PRINIVIL,ZESTRIL) 20 MG tablet Take 1 tablet (20 mg total) by mouth daily. Qty: 30 tablet, Refills: 0    metFORMIN (GLUCOPHAGE) 500 MG tablet Take 1 tablet (500 mg total) by mouth 2 (two) times daily with a meal. Qty: 30 tablet, Refills: 0      CONTINUE these medications which have NOT CHANGED   Details  albuterol (PROVENTIL HFA;VENTOLIN HFA) 108 (90 BASE) MCG/ACT inhaler Inhale 2 puffs into the lungs every 6 (six) hours as needed for wheezing or shortness of breath (wheezing).     ondansetron (ZOFRAN-ODT) 4 MG disintegrating tablet Take 4 mg by mouth every 8 (eight) hours as needed for nausea or vomiting.    zolpidem (AMBIEN) 10 MG tablet Take 5 mg by mouth at bedtime as needed for sleep.         Discharge Condition:stable    Discharge Instructions Get Medicines reviewed and adjusted: Please take all your  medications with you for your next visit with your Primary MD  Please request your Primary MD to go over all hospital tests and procedure/radiological results at the follow up, please ask your Primary MD to get all Hospital records sent to his/her office.  If you experience worsening of your admission symptoms, develop shortness of breath, life threatening emergency, suicidal or homicidal thoughts you must seek medical attention immediately by calling 911 or calling your MD immediately if symptoms less severe.  You must read complete instructions/literature along with all the possible adverse reactions/side effects for all the Medicines you take and that have been prescribed to you. Take any new Medicines after you have completely understood and accpet all the possible adverse reactions/side effects.   Do not drive when taking Pain medications.   Do not take more than prescribed Pain, Sleep and Anxiety Medications  Special Instructions: If you have smoked or chewed Tobacco in the last 2 yrs please stop smoking, stop any regular Alcohol and or any Recreational drug use.  Wear Seat belts while driving.  Please note  You were cared for by a hospitalist during your hospital stay. Once you are discharged, your primary care physician will handle any further medical issues. Please note that NO REFILLS for any discharge medications will be authorized once you are discharged, as it is imperative that you return to  your primary care physician (or establish a relationship with a primary care physician if you do not have one) for your aftercare needs so that they can reassess your need for medications and monitor your lab values.  Discharge Instructions    Diet - low sodium heart healthy    Complete by:  As directed      Increase activity slowly    Complete by:  As directed             Allergies  Allergen Reactions  . Vicodin [Hydrocodone-Acetaminophen] Nausea Only    PT. STATED IT MAKES HIM  NAUSEATED .      Disposition: 01-Home or Self Care   Consults: None     Significant Diagnostic Studies:  Ct Abdomen Pelvis Wo Contrast  08/30/2015  CLINICAL DATA:  Severe central abdominal pain radiating to chest and back. Umbilical area pain radiating up to chest with shortness of breath. EXAM: CT CHEST, ABDOMEN AND PELVIS WITHOUT CONTRAST TECHNIQUE: Multidetector CT imaging of the chest, abdomen and pelvis was performed following the standard protocol without IV contrast. COMPARISON:  CT abdomen dated 01/06/2015. FINDINGS: CT CHEST FINDINGS Mediastinum/Lymph Nodes: Evaluation of the thoracic aorta is limited without intravascular contrast. Thoracic aorta is normal in caliber. No evidence of intramural hematoma. No hemorrhage or edema within the mediastinum. Heart size is normal. No pericardial effusion. Lungs/Pleura: Lungs are clear. No consolidation or pleural effusion. No pneumothorax. Trachea and central bronchi are unremarkable. Musculoskeletal: No acute or suspicious osseous lesion. Mild degenerative change noted within the thoracic spine. Superficial soft tissues about the chest are unremarkable. CT ABDOMEN PELVIS FINDINGS Hepatobiliary: No mass visualize within the liver on this unenhanced exam. Gallbladder is unremarkable. No bile duct dilatation seen. Pancreas: No mass or inflammatory process identified on this un-enhanced exam. Spleen: Within normal limits in size. Adrenals/Urinary Tract: Kidneys are unremarkable without stone or hydronephrosis. No ureteral or bladder calculi identified. Bladder appears normal. No bladder stone. Adrenal glands appear normal. Stomach/Bowel: Bowel is normal in caliber. No bowel wall thickening or evidence of bowel wall inflammation seen. Appendix is normal. Stomach appears normal. Vascular/Lymphatic: Abdominal aorta is normal in caliber. No enlarged lymph nodes seen within the abdomen or pelvis. Reproductive: No mass or other significant abnormality.  Other: No free fluid or abscess collections seen. No free intraperitoneal air. Musculoskeletal: No acute or suspicious osseous lesion. Mild degenerative change noted within the lumbar spine. Superficial soft tissues are unremarkable. IMPRESSION: No acute findings within the chest, abdomen or pelvis on this noncontrast CT examination. Mild degenerative change within the thoracic and lumbar spine. Electronically Signed   By: Franki Cabot M.D.   On: 08/30/2015 20:15   Ct Head Wo Contrast  08/31/2015  CLINICAL DATA:  Right facial droop EXAM: CT HEAD WITHOUT CONTRAST TECHNIQUE: Contiguous axial images were obtained from the base of the skull through the vertex without intravenous contrast. COMPARISON:  01/06/2015 FINDINGS: Mild diffuse cortical atrophy. 2.5 cm focus of low attenuation deep periventricular white matter on the right, identical to prior study. No evidence of acute vascular territory infarct. No hemorrhage or extra-axial fluid. No hydrocephalus. Both middle cerebral arteries appear relatively hyper attenuating, left more so than right. This appearance is unchanged from the prior study. There are no findings of infarction in the middle cerebral artery territories. The calvarium is intact. Mild chronic inflammatory change in the maxillary sinuses bilaterally, with air-fluid level in right maxillary sinus similar to prior study. IMPRESSION: Stable chronic right basal ganglia infarct. No acute intracranial  abnormality. Sinusitis. Electronically Signed   By: Skipper Cliche M.D.   On: 08/31/2015 08:25   Ct Chest Wo Contrast  08/30/2015  CLINICAL DATA:  Severe central abdominal pain radiating to chest and back. Umbilical area pain radiating up to chest with shortness of breath. EXAM: CT CHEST, ABDOMEN AND PELVIS WITHOUT CONTRAST TECHNIQUE: Multidetector CT imaging of the chest, abdomen and pelvis was performed following the standard protocol without IV contrast. COMPARISON:  CT abdomen dated 01/06/2015.  FINDINGS: CT CHEST FINDINGS Mediastinum/Lymph Nodes: Evaluation of the thoracic aorta is limited without intravascular contrast. Thoracic aorta is normal in caliber. No evidence of intramural hematoma. No hemorrhage or edema within the mediastinum. Heart size is normal. No pericardial effusion. Lungs/Pleura: Lungs are clear. No consolidation or pleural effusion. No pneumothorax. Trachea and central bronchi are unremarkable. Musculoskeletal: No acute or suspicious osseous lesion. Mild degenerative change noted within the thoracic spine. Superficial soft tissues about the chest are unremarkable. CT ABDOMEN PELVIS FINDINGS Hepatobiliary: No mass visualize within the liver on this unenhanced exam. Gallbladder is unremarkable. No bile duct dilatation seen. Pancreas: No mass or inflammatory process identified on this un-enhanced exam. Spleen: Within normal limits in size. Adrenals/Urinary Tract: Kidneys are unremarkable without stone or hydronephrosis. No ureteral or bladder calculi identified. Bladder appears normal. No bladder stone. Adrenal glands appear normal. Stomach/Bowel: Bowel is normal in caliber. No bowel wall thickening or evidence of bowel wall inflammation seen. Appendix is normal. Stomach appears normal. Vascular/Lymphatic: Abdominal aorta is normal in caliber. No enlarged lymph nodes seen within the abdomen or pelvis. Reproductive: No mass or other significant abnormality. Other: No free fluid or abscess collections seen. No free intraperitoneal air. Musculoskeletal: No acute or suspicious osseous lesion. Mild degenerative change noted within the lumbar spine. Superficial soft tissues are unremarkable. IMPRESSION: No acute findings within the chest, abdomen or pelvis on this noncontrast CT examination. Mild degenerative change within the thoracic and lumbar spine. Electronically Signed   By: Franki Cabot M.D.   On: 08/30/2015 20:15   Mr Brain Wo Contrast  08/31/2015  CLINICAL DATA:  Slurred speech,  gait disturbance, and worsening facial droop. EXAM: MRI HEAD WITHOUT CONTRAST TECHNIQUE: Multiplanar, multiecho pulse sequences of the brain and surrounding structures were obtained without intravenous contrast. COMPARISON:  Head CT 08/31/2015 FINDINGS: Multiple sequences are mildly motion degraded, and sagittal T1 images are moderately to severely degraded. The patient could not tolerate the complete examination, and T2* gradient echo and axial T1 images were not obtained. There is no evidence of acute infarct, mass, midline shift, or extra-axial fluid collection. There is mild global cerebral atrophy. A chronic right basal ganglia infarct is present with ex vacuo dilatation of the right lateral ventricle and associated chronic blood products. Periventricular white matter T2 hyperintensities elsewhere are compatible with mild chronic small vessel ischemic disease. Mild bilateral maxillary sinus mucosal thickening and small volume right maxillary sinus fluid are present. Depression of the right lamina papyracea may reflect an old medial orbital blowout fracture. Globes appear intact. There is a trace left mastoid effusion. The distal right vertebral artery is not well seen and may be hypoplastic. Other major intracranial vascular flow voids are preserved. IMPRESSION: 1. Motion degraded, incomplete examination.  No acute infarct. 2. Chronic right basal ganglia infarct with mild chronic small vessel ischemic disease elsewhere. Electronically Signed   By: Logan Bores M.D.   On: 08/31/2015 16:21   Dg Chest Portable 1 View  08/30/2015  CLINICAL DATA:  Chest pain. EXAM: PORTABLE CHEST  1 VIEW COMPARISON:  March 14, 2014. FINDINGS: The heart size and mediastinal contours are within normal limits. Both lungs are clear. No pneumothorax or pleural effusion is noted. The visualized skeletal structures are unremarkable. IMPRESSION: No acute cardiopulmonary abnormality seen. Electronically Signed   By: Marijo Conception,  M.D.   On: 08/30/2015 18:45   2-D echo Left ventricle: The cavity size was normal. There was mild  concentric hypertrophy. Systolic function was normal. Wall motion  was normal; there were no regional wall motion abnormalities.  There was fusion of early and atrial contributions to ventricular  filling.   Filed Weights   08/30/15 1752 08/30/15 2113  Weight: 93.441 kg (206 lb) 91.989 kg (202 lb 12.8 oz)     Microbiology: No results found for this or any previous visit (from the past 240 hour(s)).     Blood Culture    Component Value Date/Time   SDES URINE, RANDOM 05/04/2014 1836   SPECREQUEST NONE 05/04/2014 1836   CULT NO GROWTH Performed at [facility]  05/04/2014 1836   REPTSTATUS 05/06/2014 FINAL 05/04/2014 1836      Labs: Results for orders placed or performed during the hospital encounter of 08/30/15 (from the past 48 hour(s))  Glucose, capillary     Status: Abnormal   Collection Time: 08/31/15  4:20 PM  Result Value Ref Range   Glucose-Capillary 152 (H) 65 - 99 mg/dL   Comment 1 Notify RN   Glucose, capillary     Status: Abnormal   Collection Time: 08/31/15  9:23 PM  Result Value Ref Range   Glucose-Capillary 119 (H) 65 - 99 mg/dL  CBC     Status: None   Collection Time: 09/01/15  3:20 AM  Result Value Ref Range   WBC 7.2 4.0 - 10.5 K/uL   RBC 4.95 4.22 - 5.81 MIL/uL   Hemoglobin 15.2 13.0 - 17.0 g/dL   HCT 45.0 39.0 - 52.0 %   MCV 90.9 78.0 - 100.0 fL   MCH 30.7 26.0 - 34.0 pg   MCHC 33.8 30.0 - 36.0 g/dL   RDW 14.6 11.5 - 15.5 %   Platelets 232 150 - 400 K/uL  Basic metabolic panel     Status: Abnormal   Collection Time: 09/01/15  3:20 AM  Result Value Ref Range   Sodium 139 135 - 145 mmol/L   Potassium 4.1 3.5 - 5.1 mmol/L   Chloride 109 101 - 111 mmol/L   CO2 23 22 - 32 mmol/L   Glucose, Bld 119 (H) 65 - 99 mg/dL   BUN 17 6 - 20 mg/dL   Creatinine, Ser 1.63 (H) 0.61 - 1.24 mg/dL   Calcium 8.9 8.9 - 10.3 mg/dL   GFR calc non Af  Amer 42 (L) >60 mL/min   GFR calc Af Amer 49 (L) >60 mL/min    Comment: (NOTE) The eGFR has been calculated using the CKD EPI equation. This calculation has not been validated in all clinical situations. eGFR's persistently <60 mL/min signify possible Chronic Kidney Disease.    Anion gap 7 5 - 15  Glucose, capillary     Status: Abnormal   Collection Time: 09/01/15  6:33 AM  Result Value Ref Range   Glucose-Capillary 114 (H) 65 - 99 mg/dL  Glucose, capillary     Status: Abnormal   Collection Time: 09/01/15 11:34 AM  Result Value Ref Range   Glucose-Capillary 119 (H) 65 - 99 mg/dL   Comment 1 Notify RN  Comment 2 Document in Chart   TSH     Status: None   Collection Time: 09/01/15  1:21 PM  Result Value Ref Range   TSH 1.094 0.350 - 4.500 uIU/mL  Glucose, capillary     Status: Abnormal   Collection Time: 09/01/15  4:18 PM  Result Value Ref Range   Glucose-Capillary 115 (H) 65 - 99 mg/dL   Comment 1 Notify RN    Comment 2 Document in Chart   Glucose, capillary     Status: Abnormal   Collection Time: 09/01/15 10:10 PM  Result Value Ref Range   Glucose-Capillary 121 (H) 65 - 99 mg/dL  Comprehensive metabolic panel     Status: Abnormal   Collection Time: 09/02/15  2:31 AM  Result Value Ref Range   Sodium 140 135 - 145 mmol/L   Potassium 4.5 3.5 - 5.1 mmol/L   Chloride 110 101 - 111 mmol/L   CO2 17 (L) 22 - 32 mmol/L   Glucose, Bld 106 (H) 65 - 99 mg/dL   BUN 16 6 - 20 mg/dL   Creatinine, Ser 1.40 (H) 0.61 - 1.24 mg/dL   Calcium 8.5 (L) 8.9 - 10.3 mg/dL   Total Protein 5.6 (L) 6.5 - 8.1 g/dL   Albumin 3.1 (L) 3.5 - 5.0 g/dL   AST 19 15 - 41 U/L   ALT 15 (L) 17 - 63 U/L   Alkaline Phosphatase 48 38 - 126 U/L   Total Bilirubin 0.2 (L) 0.3 - 1.2 mg/dL   GFR calc non Af Amer 51 (L) >60 mL/min   GFR calc Af Amer 59 (L) >60 mL/min    Comment: (NOTE) The eGFR has been calculated using the CKD EPI equation. This calculation has not been validated in all clinical  situations. eGFR's persistently <60 mL/min signify possible Chronic Kidney Disease.    Anion gap 13 5 - 15  CBC     Status: None   Collection Time: 09/02/15  2:31 AM  Result Value Ref Range   WBC 6.2 4.0 - 10.5 K/uL   RBC 4.58 4.22 - 5.81 MIL/uL   Hemoglobin 14.2 13.0 - 17.0 g/dL   HCT 41.6 39.0 - 52.0 %   MCV 90.8 78.0 - 100.0 fL   MCH 31.0 26.0 - 34.0 pg   MCHC 34.1 30.0 - 36.0 g/dL   RDW 14.2 11.5 - 15.5 %   Platelets 219 150 - 400 K/uL  Glucose, capillary     Status: Abnormal   Collection Time: 09/02/15  5:10 AM  Result Value Ref Range   Glucose-Capillary 106 (H) 65 - 99 mg/dL  Glucose, capillary     Status: None   Collection Time: 09/02/15  6:15 AM  Result Value Ref Range   Glucose-Capillary 94 65 - 99 mg/dL  Glucose, capillary     Status: Abnormal   Collection Time: 09/02/15 11:34 AM  Result Value Ref Range   Glucose-Capillary 117 (H) 65 - 99 mg/dL     Lipid Panel  No results found for: CHOL, TRIG, HDL, CHOLHDL, VLDL, LDLCALC, LDLDIRECT   Lab Results  Component Value Date   HGBA1C 6.0* 03/17/2014     Lab Results  Component Value Date   CREATININE 1.40* 09/02/2015    Ryan Hopkins is a 65 y.o. gentleman with a history of COPD/emphysema, CVA with R sided weakness,, HTN, DM, TIA, and prostate cancer who presents to the ED for evaluation of multiple complaints, reporting that he has had 1.5 weeks of decreased appetite/PO  intake, right flank pain as well as periumbilical pain that radiates to his substernal area. He has mild discomfort in his left shoulder as well. He reports profuse diaphoresis today, but no nausea or vomiting. No documented fever. He has had light-headedness, and says speech is more slurred   Assessment & Plan: Abdominal pain and atypical chest pain.  - troponin's negative, CT Chest abdomen pelvis shows No acute findings within the chest, abdomen or pelvis on this noncontrast CT examination. Started on PPI, Symptoms improved,t -EKG  Shows first-degree AV block with fusion complexes, ECHO Within normal limits Symptomatology not suggestive of PE , May benefit from outpatient stress test given risk factors, referral provided  Generalized weakness Likely secondary to acute kidney injury   Facial droop, slurred speech -and residual R side weakness from prior CVA,MRI brain No acute infarct. Chronic right basal ganglia infarct with mild chronic small vessel ischemic disease elsewhere. -Ct with prior CVA Continue aspirin 325 mg a day   AKI, Baseline 1.6, presented with a creatinine of 3.16, improving  Now 1.4 Resolved with NS, due to NSIADs,/ACE inhibitor, volume depletion -Hold metformin, lisinopril for 1 more week until seen by PCP and renal function has been repeated    Prolonged PR interval on admit EKG --Mag ok, repeat EKG today  H/o CVA -cant tell me if symptoms worsened 1 or 2 weeks ago -continue ASA, FU MRI  DM --SSI, hold metformin Until next week Hemoglobin A1c from 4/28 still pending  Nicotine dependence ; start nicotine patch     Discharge Exam:    Blood pressure 128/88, pulse 60, temperature 97.8 F (36.6 C), temperature source Oral, resp. rate 18, height '5\' 7"'  (1.702 m), weight 91.989 kg (202 lb 12.8 oz), SpO2 100 %.  General exam: Appears calm and comfortable, speech slurred, facial droop Respiratory system: Clear to auscultation. Respiratory effort normal. Cardiovascular system: S1 & S2 heard, RRR. No JVD, murmurs, rubs, gallops or clicks. No pedal edema. Gastrointestinal system: Abdomen is nondistended, soft and nontender. No organomegaly or masses felt. Normal bowel sounds heard. Central nervous system: Alert and oriented. Mild r sided weakness-? Residual Extremities: Symmetric 5 x 5 power. Skin: No rashes, lesions or ulcers Psychiatry: Judgement and insight appear normal. Mood & affect appropriate.     Follow-up Information    Follow up with [facility].    Specialty:  Internal Medicine   Why:  call and reschedule your appointment that you missed   Contact information:   [place] [phone]      Follow up with lb cards. Schedule an appointment as soon as possible for a visit in 1 week.   Why:  Called to verify date of stress test   Contact information:   [address] [place] [address] [phone]      Signed: Reyne Dumas 09/02/2015, 2:11 PM        Time spent >45 mins

## 2015-09-02 NOTE — Progress Notes (Signed)
Pt. Discharged to home  Pt. D/C'd walked with [facility] Discharge information reviewed and given All personal belongings given to Pt.  Education discussed IV was d/c Tele d/c

## 2015-09-02 NOTE — Care Management Obs Status (Signed)
[facility] NOTIFICATION   Patient Details  Name: Ryan Hopkins MRN: [id number] Date of Birth: 1951-04-03   Medicare Observation Status Notification Given:  Yes    Dawayne Patricia, RN 09/02/2015, 11:21 AM

## 2015-09-04 NOTE — Discharge Summary (Signed)
Physician Discharge Summary  Ryan Hopkins MRN: [id number] DOB/AGE: October 29, 1950 65 y.o.  PCP: No PCP Per Patient   Admit date: 08/30/2015 Discharge date: 09/04/2015  Discharge Diagnoses:     Principal Problem:   AKI (acute kidney injury) [facility]) Active Problems:   Chest pain   Facial droop   DM (diabetes mellitus) ([place])   Acute chest pain    Follow-up recommendations Follow-up with PCP in 3-5 days , including all  additional recommended appointments as below Follow-up CBC, CMP in 3-5 days Patient referred to be set up for outpatient stress test      Discharge Medication List as of 09/02/2015  2:25 PM    START taking these medications   Details  aspirin EC 325 MG EC tablet Take 1 tablet (325 mg total) by mouth daily., Starting 09/02/2015, Until Discontinued, Normal    nicotine (NICODERM CQ - DOSED IN MG/24 HOURS) 21 mg/24hr patch Place 1 patch (21 mg total) onto the skin daily., Starting 09/02/2015, Until Discontinued, Normal    pantoprazole (PROTONIX) 40 MG tablet Take 1 tablet (40 mg total) by mouth daily., Starting 09/02/2015, Until Discontinued, Normal      CONTINUE these medications which have CHANGED   Details  lisinopril (PRINIVIL,ZESTRIL) 20 MG tablet Take 1 tablet (20 mg total) by mouth daily., Starting 09/09/2015, Until Discontinued, Normal    metFORMIN (GLUCOPHAGE) 500 MG tablet Take 1 tablet (500 mg total) by mouth 2 (two) times daily with a meal., Starting 09/09/2015, Until Discontinued, Normal      CONTINUE these medications which have NOT CHANGED   Details  albuterol (PROVENTIL HFA;VENTOLIN HFA) 108 (90 BASE) MCG/ACT inhaler Inhale 2 puffs into the lungs every 6 (six) hours as needed for wheezing or shortness of breath (wheezing). , Until Discontinued, Historical Med    ondansetron (ZOFRAN-ODT) 4 MG disintegrating tablet Take 4 mg by mouth every 8 (eight) hours as needed for nausea or vomiting., Until Discontinued, Historical Med    zolpidem (AMBIEN) 10 MG tablet  Take 5 mg by mouth at bedtime as needed for sleep., Until Discontinued, Historical Med         Discharge Condition:stable    Discharge Instructions Get Medicines reviewed and adjusted: Please take all your medications with you for your next visit with your Primary MD  Please request your Primary MD to go over all hospital tests and procedure/radiological results at the follow up, please ask your Primary MD to get all Hospital records sent to his/her office.  If you experience worsening of your admission symptoms, develop shortness of breath, life threatening emergency, suicidal or homicidal thoughts you must seek medical attention immediately by calling 911 or calling your MD immediately if symptoms less severe.  You must read complete instructions/literature along with all the possible adverse reactions/side effects for all the Medicines you take and that have been prescribed to you. Take any new Medicines after you have completely understood and accpet all the possible adverse reactions/side effects.   Do not drive when taking Pain medications.   Do not take more than prescribed Pain, Sleep and Anxiety Medications  Special Instructions: If you have smoked or chewed Tobacco in the last 2 yrs please stop smoking, stop any regular Alcohol and or any Recreational drug use.  Wear Seat belts while driving.  Please note  You were cared for by a hospitalist during your hospital stay. Once you are discharged, your primary care physician will handle any further medical issues. Please note that NO REFILLS for any  discharge medications will be authorized once you are discharged, as it is imperative that you return to your primary care physician (or establish a relationship with a primary care physician if you do not have one) for your aftercare needs so that they can reassess your need for medications and monitor your lab values.  Discharge Instructions    Diet - low sodium heart healthy     Complete by:  As directed      Increase activity slowly    Complete by:  As directed             Allergies  Allergen Reactions  . Vicodin [Hydrocodone-Acetaminophen] Nausea Only    PT. STATED IT MAKES HIM NAUSEATED .      Disposition: 01-Home or Self Care   Consults: None     Significant Diagnostic Studies:  Ct Abdomen Pelvis Wo Contrast  08/30/2015  CLINICAL DATA:  Severe central abdominal pain radiating to chest and back. Umbilical area pain radiating up to chest with shortness of breath. EXAM: CT CHEST, ABDOMEN AND PELVIS WITHOUT CONTRAST TECHNIQUE: Multidetector CT imaging of the chest, abdomen and pelvis was performed following the standard protocol without IV contrast. COMPARISON:  CT abdomen dated 01/06/2015. FINDINGS: CT CHEST FINDINGS Mediastinum/Lymph Nodes: Evaluation of the thoracic aorta is limited without intravascular contrast. Thoracic aorta is normal in caliber. No evidence of intramural hematoma. No hemorrhage or edema within the mediastinum. Heart size is normal. No pericardial effusion. Lungs/Pleura: Lungs are clear. No consolidation or pleural effusion. No pneumothorax. Trachea and central bronchi are unremarkable. Musculoskeletal: No acute or suspicious osseous lesion. Mild degenerative change noted within the thoracic spine. Superficial soft tissues about the chest are unremarkable. CT ABDOMEN PELVIS FINDINGS Hepatobiliary: No mass visualize within the liver on this unenhanced exam. Gallbladder is unremarkable. No bile duct dilatation seen. Pancreas: No mass or inflammatory process identified on this un-enhanced exam. Spleen: Within normal limits in size. Adrenals/Urinary Tract: Kidneys are unremarkable without stone or hydronephrosis. No ureteral or bladder calculi identified. Bladder appears normal. No bladder stone. Adrenal glands appear normal. Stomach/Bowel: Bowel is normal in caliber. No bowel wall thickening or evidence of bowel wall inflammation seen.  Appendix is normal. Stomach appears normal. Vascular/Lymphatic: Abdominal aorta is normal in caliber. No enlarged lymph nodes seen within the abdomen or pelvis. Reproductive: No mass or other significant abnormality. Other: No free fluid or abscess collections seen. No free intraperitoneal air. Musculoskeletal: No acute or suspicious osseous lesion. Mild degenerative change noted within the lumbar spine. Superficial soft tissues are unremarkable. IMPRESSION: No acute findings within the chest, abdomen or pelvis on this noncontrast CT examination. Mild degenerative change within the thoracic and lumbar spine. Electronically Signed   By: Franki Cabot M.D.   On: 08/30/2015 20:15   Ct Head Wo Contrast  08/31/2015  CLINICAL DATA:  Right facial droop EXAM: CT HEAD WITHOUT CONTRAST TECHNIQUE: Contiguous axial images were obtained from the base of the skull through the vertex without intravenous contrast. COMPARISON:  01/06/2015 FINDINGS: Mild diffuse cortical atrophy. 2.5 cm focus of low attenuation deep periventricular white matter on the right, identical to prior study. No evidence of acute vascular territory infarct. No hemorrhage or extra-axial fluid. No hydrocephalus. Both middle cerebral arteries appear relatively hyper attenuating, left more so than right. This appearance is unchanged from the prior study. There are no findings of infarction in the middle cerebral artery territories. The calvarium is intact. Mild chronic inflammatory change in the maxillary sinuses bilaterally, with air-fluid level in  right maxillary sinus similar to prior study. IMPRESSION: Stable chronic right basal ganglia infarct. No acute intracranial abnormality. Sinusitis. Electronically Signed   By: Skipper Cliche M.D.   On: 08/31/2015 08:25   Ct Chest Wo Contrast  08/30/2015  CLINICAL DATA:  Severe central abdominal pain radiating to chest and back. Umbilical area pain radiating up to chest with shortness of breath. EXAM: CT CHEST,  ABDOMEN AND PELVIS WITHOUT CONTRAST TECHNIQUE: Multidetector CT imaging of the chest, abdomen and pelvis was performed following the standard protocol without IV contrast. COMPARISON:  CT abdomen dated 01/06/2015. FINDINGS: CT CHEST FINDINGS Mediastinum/Lymph Nodes: Evaluation of the thoracic aorta is limited without intravascular contrast. Thoracic aorta is normal in caliber. No evidence of intramural hematoma. No hemorrhage or edema within the mediastinum. Heart size is normal. No pericardial effusion. Lungs/Pleura: Lungs are clear. No consolidation or pleural effusion. No pneumothorax. Trachea and central bronchi are unremarkable. Musculoskeletal: No acute or suspicious osseous lesion. Mild degenerative change noted within the thoracic spine. Superficial soft tissues about the chest are unremarkable. CT ABDOMEN PELVIS FINDINGS Hepatobiliary: No mass visualize within the liver on this unenhanced exam. Gallbladder is unremarkable. No bile duct dilatation seen. Pancreas: No mass or inflammatory process identified on this un-enhanced exam. Spleen: Within normal limits in size. Adrenals/Urinary Tract: Kidneys are unremarkable without stone or hydronephrosis. No ureteral or bladder calculi identified. Bladder appears normal. No bladder stone. Adrenal glands appear normal. Stomach/Bowel: Bowel is normal in caliber. No bowel wall thickening or evidence of bowel wall inflammation seen. Appendix is normal. Stomach appears normal. Vascular/Lymphatic: Abdominal aorta is normal in caliber. No enlarged lymph nodes seen within the abdomen or pelvis. Reproductive: No mass or other significant abnormality. Other: No free fluid or abscess collections seen. No free intraperitoneal air. Musculoskeletal: No acute or suspicious osseous lesion. Mild degenerative change noted within the lumbar spine. Superficial soft tissues are unremarkable. IMPRESSION: No acute findings within the chest, abdomen or pelvis on this noncontrast CT  examination. Mild degenerative change within the thoracic and lumbar spine. Electronically Signed   By: Franki Cabot M.D.   On: 08/30/2015 20:15   Mr Brain Wo Contrast  08/31/2015  CLINICAL DATA:  Slurred speech, gait disturbance, and worsening facial droop. EXAM: MRI HEAD WITHOUT CONTRAST TECHNIQUE: Multiplanar, multiecho pulse sequences of the brain and surrounding structures were obtained without intravenous contrast. COMPARISON:  Head CT 08/31/2015 FINDINGS: Multiple sequences are mildly motion degraded, and sagittal T1 images are moderately to severely degraded. The patient could not tolerate the complete examination, and T2* gradient echo and axial T1 images were not obtained. There is no evidence of acute infarct, mass, midline shift, or extra-axial fluid collection. There is mild global cerebral atrophy. A chronic right basal ganglia infarct is present with ex vacuo dilatation of the right lateral ventricle and associated chronic blood products. Periventricular white matter T2 hyperintensities elsewhere are compatible with mild chronic small vessel ischemic disease. Mild bilateral maxillary sinus mucosal thickening and small volume right maxillary sinus fluid are present. Depression of the right lamina papyracea may reflect an old medial orbital blowout fracture. Globes appear intact. There is a trace left mastoid effusion. The distal right vertebral artery is not well seen and may be hypoplastic. Other major intracranial vascular flow voids are preserved. IMPRESSION: 1. Motion degraded, incomplete examination.  No acute infarct. 2. Chronic right basal ganglia infarct with mild chronic small vessel ischemic disease elsewhere. Electronically Signed   By: Logan Bores M.D.   On: 08/31/2015 16:21  Dg Chest Portable 1 View  08/30/2015  CLINICAL DATA:  Chest pain. EXAM: PORTABLE CHEST 1 VIEW COMPARISON:  March 14, 2014. FINDINGS: The heart size and mediastinal contours are within normal limits. Both  lungs are clear. No pneumothorax or pleural effusion is noted. The visualized skeletal structures are unremarkable. IMPRESSION: No acute cardiopulmonary abnormality seen. Electronically Signed   By: Marijo Conception, M.D.   On: 08/30/2015 18:45   2-D echo Left ventricle: The cavity size was normal. There was mild  concentric hypertrophy. Systolic function was normal. Wall motion  was normal; there were no regional wall motion abnormalities.  There was fusion of early and atrial contributions to ventricular  filling.   Filed Weights   08/30/15 1752 08/30/15 2113  Weight: 93.441 kg (206 lb) 91.989 kg (202 lb 12.8 oz)     Microbiology: No results found for this or any previous visit (from the past 240 hour(s)).     Blood Culture    Component Value Date/Time   SDES URINE, RANDOM 05/04/2014 1836   SPECREQUEST NONE 05/04/2014 1836   CULT NO GROWTH Performed at [facility]  05/04/2014 1836   REPTSTATUS 05/06/2014 FINAL 05/04/2014 1836      Labs: No results found for this or any previous visit (from the past 48 hour(s)).   Lipid Panel  No results found for: CHOL, TRIG, HDL, CHOLHDL, VLDL, LDLCALC, LDLDIRECT   Lab Results  Component Value Date   HGBA1C 6.3* 08/30/2015   HGBA1C 6.0* 03/17/2014     Lab Results  Component Value Date   CREATININE 1.40* 09/02/2015    Ryan Hopkins is a 65 y.o. gentleman with a history of COPD/emphysema, CVA with R sided weakness,, HTN, DM, TIA, and prostate cancer who presents to the ED for evaluation of multiple complaints, reporting that he has had 1.5 weeks of decreased appetite/PO intake, right flank pain as well as periumbilical pain that radiates to his substernal area. He has mild discomfort in his left shoulder as well. He reports profuse diaphoresis today, but no nausea or vomiting. No documented fever. He has had light-headedness, and says speech is more slurred   Assessment & Plan: Abdominal pain and atypical  chest pain.  - troponin's negative, CT Chest abdomen pelvis shows No acute findings within the chest, abdomen or pelvis on this noncontrast CT examination. Started on PPI, Symptoms improved,t -EKG Shows first-degree AV block with fusion complexes, ECHO Within normal limits Symptomatology not suggestive of PE , May benefit from outpatient stress test given risk factors, referral provided  Generalized weakness Likely secondary to acute kidney injury   Facial droop, slurred speech -and residual R side weakness from prior CVA,MRI brain No acute infarct. Chronic right basal ganglia infarct with mild chronic small vessel ischemic disease elsewhere. -Ct with prior CVA Continue aspirin 325 mg a day   AKI, Baseline 1.6, presented with a creatinine of 3.16, improving  Now 1.4 Resolved with NS, due to NSIADs,/ACE inhibitor, volume depletion -Hold metformin, lisinopril for 1 more week until seen by PCP and renal function has been repeated    Prolonged PR interval on admit EKG --Mag ok, repeat EKG today  H/o CVA -cant tell me if symptoms worsened 1 or 2 weeks ago -continue ASA, FU MRI  DM --SSI, hold metformin Until next week Hemoglobin A1c from 4/28 still pending  Nicotine dependence ; start nicotine patch     Discharge Exam:    Blood pressure 128/88, pulse 60, temperature 97.8 F (36.6 C),  temperature source Oral, resp. rate 18, height 5\' 7"  (1.702 m), weight 91.989 kg (202 lb 12.8 oz), SpO2 100 %.  General exam: Appears calm and comfortable, speech slurred, facial droop Respiratory system: Clear to auscultation. Respiratory effort normal. Cardiovascular system: S1 & S2 heard, RRR. No JVD, murmurs, rubs, gallops or clicks. No pedal edema. Gastrointestinal system: Abdomen is nondistended, soft and nontender. No organomegaly or masses felt. Normal bowel sounds heard. Central nervous system: Alert and oriented. Mild r sided weakness-? Residual Extremities: Symmetric 5 x 5  power. Skin: No rashes, lesions or ulcers Psychiatry: Judgement and insight appear normal. Mood & affect appropriate.     Follow-up Information    Follow up with [place].   Specialty:  Internal Medicine   Why:  call and reschedule your appointment that you missed   Contact information:   [place] [phone]      Follow up with lb cards. Schedule an appointment as soon as possible for a visit in 1 week.   Why:  Called to verify date of stress test   Contact information:   [address] [address] [phone]      Signed: Reyne Dumas 09/04/2015, 7:33 PM        Time spent >45 mins

## 2015-10-07 ENCOUNTER — Ambulatory Visit: Payer: Medicare Other | Admitting: Podiatry

## 2015-10-28 ENCOUNTER — Ambulatory Visit: Payer: Medicare Other | Admitting: Podiatry

## 2015-11-11 ENCOUNTER — Ambulatory Visit (INDEPENDENT_AMBULATORY_CARE_PROVIDER_SITE_OTHER): Payer: Medicare Other | Admitting: Pulmonary Disease

## 2015-11-11 ENCOUNTER — Encounter: Payer: Self-pay | Admitting: Pulmonary Disease

## 2015-11-11 ENCOUNTER — Encounter (INDEPENDENT_AMBULATORY_CARE_PROVIDER_SITE_OTHER): Payer: Self-pay

## 2015-11-11 VITALS — BP 130/90 | HR 75 | Ht 68.0 in | Wt 206.0 lb

## 2015-11-11 DIAGNOSIS — I639 Cerebral infarction, unspecified: Secondary | ICD-10-CM | POA: Diagnosis not present

## 2015-11-11 DIAGNOSIS — J439 Emphysema, unspecified: Secondary | ICD-10-CM

## 2015-11-11 DIAGNOSIS — G4733 Obstructive sleep apnea (adult) (pediatric): Secondary | ICD-10-CM

## 2015-11-11 NOTE — Assessment & Plan Note (Addendum)
Mild OSA AHI 9 (04/2015). Has COPD and has DM and is on chronic pain meds.  Patient has mild symptoms. His main issue is to chronic back pain.  We ordered autocpap in 08/2015. He never received his autocpap. Pls read ROV portion of todays note.    We extensively discussed the importance of treating OSA and the need to use PAP therapy.   We will order autocpap again for this pt, 5-15 cm H2O. Judeen Hammans [facility]) will call pt.. We were told, he has to meet his deductibles and hence pay the $75. That was the reason why they did not cover it back in April because patient did not pay  $75. He was not willing to pay then. He did not have resources then. Not sure if he will be willing to pay NOW.    Patient was instructed to have mask, tubings, filter, reservoir cleaned at least once a week with soapy water.  Patient was instructed to call the office if he/she is having issues with the PAP device.    I advised patient to obtain sufficient amount of sleep --  7 to 8 hours at least in a 24 hr period.  Patient was advised to follow good sleep hygiene.  Patient was advised NOT to engage in activities requiring concentration and/or vigilance if he/she is and  sleepy.  Patient is NOT to drive if he/she is sleepy.    If patient ends up getting a new CPAP machine at this time, he will need a 4-6 week follow-up after he gets his machine for compliance.

## 2015-11-11 NOTE — Progress Notes (Signed)
Subjective:    Patient ID: Ryan Hopkins, male    DOB: 03-08-1951, 65 y.o.   MRN: [id number]  HPI   This is the case of Ryan Hopkins, 65 y.o. Male, who was referred by Dr. Corinna Capra  in consultation regarding OSA.    As you very well know, patient  denies snoring.  He sleeps by himself. Occasional gasping, apneas.  Sleeps 6 hrs/night. Wakes up at night 2-3x -- unknown reason.  Wakes up refreshed in am.  Denies hypersomnia.   He had a sleep study in 04/2015 . AHI 9.   Pt has COPD. 20PY, quit  25 yrs ago.  Not on meds.   Takes percocet 10/325 TID > on and off x 5  Yrs. Same dose.  Takes ambien at hs.   ROV (11/11/15) Patient returns to the office allegedly as a follow-up on his CPAP. When he saw him months ago, we ordered an auto CPAP. Per last documentation, patient was supposed to be referred to a CPAP assistance program. That never happened. Patient told me he was being charged $75 for his CPAP machine for initial set up and a monthly charge for 1 year. He told me he could not afford to $75 and the rest of the fees so he was not able to get his CPAP machine. We called his DME company today. [facility]). Per our records, patient has Medicare and Medicaid. DME did not have Medicaid so he ended up being charged $75. Per DME, patient  can get a new CPAP machine. DME just needs to figure out if the sleep study in December 2016 will work or if he'll need a new sleep study since his been more than a month. We were about to explain this patient. He left since he could not wait. No new medical problems or issues since last seen.    Review of Systems  Constitutional: Negative.  Negative for fever and unexpected weight change.  HENT: Positive for congestion and postnasal drip. Negative for dental problem, ear pain, nosebleeds, rhinorrhea, sinus pressure, sneezing, sore throat and trouble swallowing.   Eyes: Negative.  Negative for redness and itching.  Respiratory: Positive for cough, shortness of  breath and wheezing. Negative for chest tightness.   Cardiovascular: Negative.  Negative for palpitations and leg swelling.  Gastrointestinal: Negative.  Negative for nausea and vomiting.  Endocrine: Negative.   Genitourinary: Negative.  Negative for dysuria.  Musculoskeletal: Positive for arthralgias. Negative for joint swelling.  Skin: Negative.  Negative for rash.  Allergic/Immunologic: Positive for environmental allergies.  Neurological: Negative.  Negative for headaches.  Hematological: Negative.  Does not bruise/bleed easily.  Psychiatric/Behavioral: Negative.  Negative for dysphoric mood. The patient is not nervous/anxious.       Objective:   Physical Exam   Vitals:  Filed Vitals:   11/11/15 0852  BP: 130/90  Pulse: 75  Height: 5\' 8"  (1.727 m)  Weight: 206 lb (93.441 kg)  SpO2: 100%    Constitutional/General:  Pleasant, well-nourished, well-developed, not in any distress,  Comfortably seating.  Well kempt  Body mass index is 31.33 kg/(m^2). Wt Readings from Last 3 Encounters:  11/11/15 206 lb (93.441 kg)  08/30/15 202 lb 12.8 oz (91.989 kg)  08/28/15 209 lb 6.4 oz (94.983 kg)     HEENT: Pupils equal and reactive to light and accommodation. Anicteric sclerae. Normal nasal mucosa.   No oral  lesions,  mouth clear,  oropharynx clear, no postnasal drip. (-) Oral thrush. No dental caries.  Airway - Mallampati class III  Neck: No masses. Midline trachea. No JVD, (-) LAD. (-) bruits appreciated.  Respiratory/Chest: Grossly normal chest. (-) deformity. (-) Accessory muscle use.  Symmetric expansion. (-) Tenderness on palpation.  Resonant on percussion.  Diminished BS on both lower lung zones. (-) wheezing, crackles, rhonchi (-) egophony  Cardiovascular: Regular rate and  rhythm, heart sounds normal, no murmur or gallops, no peripheral edema  Gastrointestinal:  Normal bowel sounds. Soft, non-tender. No hepatosplenomegaly.  (-) masses.   Musculoskeletal:  Normal  muscle tone. Normal gait.   Extremities: Grossly normal. (-) clubbing, cyanosis.  (-) edema  Skin: (-) rash,lesions seen.   Neurological/Psychiatric : alert, oriented to time, place, person. Normal mood and affect           Assessment & Plan:  OSA (obstructive sleep apnea) Mild OSA AHI 9 (04/2015). Has COPD and has DM and is on chronic pain meds.  Patient has mild symptoms. His main issue is to chronic back pain.  We ordered autocpap in 08/2015. He never received his autocpap. Pls read ROV portion of todays note.    We extensively discussed the importance of treating OSA and the need to use PAP therapy.   We will order autocpap again for this pt, 5-15 cm H2O. [facility]) will call pt.. We were told, he has to meet his deductibles and hence pay the $75. That was the reason why they did not cover it back in April because patient did not pay  $75. He was not willing to pay then. He did not have resources then. Not sure if he will be willing to pay NOW.    Patient was instructed to have mask, tubings, filter, reservoir cleaned at least once a week with soapy water.  Patient was instructed to call the office if he/she is having issues with the PAP device.    I advised patient to obtain sufficient amount of sleep --  7 to 8 hours at least in a 24 hr period.  Patient was advised to follow good sleep hygiene.  Patient was advised NOT to engage in activities requiring concentration and/or vigilance if he/she is and  sleepy.  Patient is NOT to drive if he/she is sleepy.    If patient ends up getting a new CPAP machine at this time, he will need a 4-6 week follow-up after he gets his machine for compliance.  COPD (chronic obstructive pulmonary disease) ([place]) 20 PY smoking, quit 20 yrs ago. Not on meds. Not too symptomatic. Needs CXR on f/u.  Needs pft on f/u.  He wanted to hold off on tests currently.  Patient will follow up with me in 1-2 mos after obtaining his cpap machine.   I spent at least 25 minutes with the patient today and more than 50% was spent counseling the patient regarding disease and management and facilitating labs and medications.         Monica Becton, MD 11/11/2015   11:24 AM Pulmonary and [place] Pager: [phone] Office: [phone], Fax: [phone]

## 2015-11-11 NOTE — Assessment & Plan Note (Addendum)
20 PY smoking, quit 20 yrs ago. Not on meds. Not too symptomatic. Needs CXR on f/u.  Needs pft on f/u.  He wanted to hold off on tests currently.

## 2016-04-02 ENCOUNTER — Ambulatory Visit (INDEPENDENT_AMBULATORY_CARE_PROVIDER_SITE_OTHER): Payer: Medicare Other | Admitting: Podiatry

## 2016-04-02 ENCOUNTER — Encounter: Payer: Self-pay | Admitting: Podiatry

## 2016-04-02 DIAGNOSIS — M79674 Pain in right toe(s): Secondary | ICD-10-CM | POA: Diagnosis not present

## 2016-04-02 DIAGNOSIS — B351 Tinea unguium: Secondary | ICD-10-CM

## 2016-04-02 DIAGNOSIS — M79675 Pain in left toe(s): Secondary | ICD-10-CM | POA: Diagnosis not present

## 2016-04-02 DIAGNOSIS — E1140 Type 2 diabetes mellitus with diabetic neuropathy, unspecified: Secondary | ICD-10-CM | POA: Diagnosis not present

## 2016-04-02 DIAGNOSIS — Q828 Other specified congenital malformations of skin: Secondary | ICD-10-CM

## 2016-04-02 NOTE — Progress Notes (Signed)
Subjective:     Patient ID: Ryan Hopkins, male   DOB: 11-10-1950, 65 y.o.   MRN: [id number]  HPI patient's found to have thick brittle nailbeds 1-5 both feet that she cannot cut and painful and lesions on both feet   Review of Systems     Objective:   Physical Exam Neurovascular status found to be moderately diminished with long-term diabetes and nail disease with thickness 1-5 both feet with incurvation and keratotic lesion plantar aspect both feet that are painful and digital areas fifth bilateral that are painful    Assessment:     At risk diabetic with mycotic nail infection and lesion formation    Plan:     Discussed diabetic education and daily inspections and today I debrided nailbeds 1-5 both feet and lesions on both feet with no iatrogenic bleeding noted

## 2016-05-05 ENCOUNTER — Encounter: Payer: Self-pay | Admitting: Pediatric Intensive Care

## 2016-05-05 NOTE — Congregational Nurse Program (Signed)
Congregational Nurse Program Note  Date of Encounter: 04/29/2016  Past Medical History: Past Medical History:  Diagnosis Date  . Anal fissure   . Back pain, chronic   . COPD (chronic obstructive pulmonary disease) ([place])   . Degenerative arthritis of spine   . Diabetes mellitus without complication ([place])   . Diverticulitis   . Emphysema   . Enlarged prostate   . Heart murmur   . Hypertension   . Nocturia   . Prostate cancer ([place]) 11/2013  . TIA (transient ischemic attack)     Encounter Details:     CNP Questionnaire - 04/29/16 1244      Patient Demographics   Is this a new or existing patient? New   Patient is considered a/an Not Applicable   Race African-American/Black     Patient Assistance   Location of Patient Assistance Not Applicable   Patient's financial/insurance status Low Income;Medicaid   Uninsured Patient ([facility]) No   Patient referred to apply for the following financial assistance Not Applicable   Food insecurities addressed Not Applicable   Transportation assistance No   Assistance securing medications No   Educational health offerings Chronic disease;Hypertension     Encounter Details   Primary purpose of visit Chronic Illness/Condition Visit   Was an Emergency Department visit averted? Not Applicable   Does patient have a medical provider? Yes   Patient referred to Not Applicable   Was a mental health screening completed? (GAINS tool) No   Does patient have dental issues? No   Does patient have vision issues? No   Does your patient have an abnormal blood pressure today? Yes   Since previous encounter, have you referred patient for abnormal blood pressure that resulted in a new diagnosis or medication change? No   Does your patient have an abnormal blood glucose today? No   Since previous encounter, have you referred patient for abnormal blood glucose that resulted in a new diagnosis or medication change? No     Requested b/p  check  160/110.  States has not taken his meds for a few days.  Left medications at a family member.  States is able to call the individual and have medications brought to him at the shelter.  Encouraged client to do so today

## 2016-05-11 ENCOUNTER — Encounter: Payer: Self-pay | Admitting: Pediatric Intensive Care

## 2016-05-19 ENCOUNTER — Encounter: Payer: Self-pay | Admitting: Pediatric Intensive Care

## 2016-05-19 ENCOUNTER — Encounter (HOSPITAL_COMMUNITY): Payer: Self-pay

## 2016-05-19 ENCOUNTER — Emergency Department (HOSPITAL_COMMUNITY)
Admission: EM | Admit: 2016-05-19 | Discharge: 2016-05-19 | Disposition: A | Discharge status: Home / Self Care | Payer: Medicare Other | Attending: Emergency Medicine | Admitting: Emergency Medicine

## 2016-05-19 DIAGNOSIS — M5441 Lumbago with sciatica, right side: Secondary | ICD-10-CM | POA: Insufficient documentation

## 2016-05-19 DIAGNOSIS — Z79899 Other long term (current) drug therapy: Secondary | ICD-10-CM | POA: Diagnosis not present

## 2016-05-19 DIAGNOSIS — Z8546 Personal history of malignant neoplasm of prostate: Secondary | ICD-10-CM | POA: Insufficient documentation

## 2016-05-19 DIAGNOSIS — J449 Chronic obstructive pulmonary disease, unspecified: Secondary | ICD-10-CM | POA: Insufficient documentation

## 2016-05-19 DIAGNOSIS — F17200 Nicotine dependence, unspecified, uncomplicated: Secondary | ICD-10-CM | POA: Diagnosis not present

## 2016-05-19 DIAGNOSIS — M5442 Lumbago with sciatica, left side: Secondary | ICD-10-CM | POA: Diagnosis not present

## 2016-05-19 DIAGNOSIS — I10 Essential (primary) hypertension: Secondary | ICD-10-CM | POA: Diagnosis not present

## 2016-05-19 DIAGNOSIS — Z7982 Long term (current) use of aspirin: Secondary | ICD-10-CM | POA: Diagnosis not present

## 2016-05-19 DIAGNOSIS — E119 Type 2 diabetes mellitus without complications: Secondary | ICD-10-CM | POA: Diagnosis not present

## 2016-05-19 DIAGNOSIS — Z8673 Personal history of transient ischemic attack (TIA), and cerebral infarction without residual deficits: Secondary | ICD-10-CM | POA: Insufficient documentation

## 2016-05-19 DIAGNOSIS — Z7984 Long term (current) use of oral hypoglycemic drugs: Secondary | ICD-10-CM | POA: Diagnosis not present

## 2016-05-19 DIAGNOSIS — R51 Headache: Secondary | ICD-10-CM | POA: Diagnosis present

## 2016-05-19 DIAGNOSIS — R519 Headache, unspecified: Secondary | ICD-10-CM

## 2016-05-19 MED ORDER — OXYCODONE-ACETAMINOPHEN 5-325 MG PO TABS: 1.0000 | ORAL_TABLET | Freq: Four times a day (QID) | ORAL | 0 refills | Status: DC | PRN

## 2016-05-19 MED ORDER — CYCLOBENZAPRINE HCL 10 MG PO TABS: 10.0000 mg | ORAL_TABLET | Freq: Two times a day (BID) | ORAL | 0 refills | Status: DC | PRN

## 2016-05-19 MED ORDER — PROCHLORPERAZINE EDISYLATE 5 MG/ML IJ SOLN
10.0000 mg | Freq: Once | INTRAMUSCULAR | Status: DC
  Filled 2016-05-19: qty 2

## 2016-05-19 MED ORDER — DIPHENHYDRAMINE HCL 50 MG/ML IJ SOLN
25.0000 mg | Freq: Once | INTRAMUSCULAR | Status: DC
  Filled 2016-05-19: qty 1

## 2016-05-19 MED ORDER — SODIUM CHLORIDE 0.9 % IV BOLUS (SEPSIS): 1000.0000 mL | Freq: Once | INTRAVENOUS | Status: DC

## 2016-05-19 MED ORDER — CYCLOBENZAPRINE HCL 10 MG PO TABS
10.0000 mg | ORAL_TABLET | Freq: Once | ORAL | Status: AC
  Administered 2016-05-19: 10 mg via ORAL
  Filled 2016-05-19: qty 1

## 2016-05-19 MED ORDER — OXYCODONE-ACETAMINOPHEN 5-325 MG PO TABS
1.0000 | ORAL_TABLET | Freq: Once | ORAL | Status: AC
  Administered 2016-05-19: 1 via ORAL
  Filled 2016-05-19: qty 1

## 2016-05-19 NOTE — ED Provider Notes (Signed)
[facility] DEPT Provider Note   CSN: [id number] Arrival date & time: 05/19/16  1144  By signing my name below, I, Soijett Blue, attest that this documentation has been prepared under the direction and in the presence of Courtney Paris, MD. Electronically Signed: Soijett Blue, ED Scribe. 05/19/16. 12:45 PM.  History   Chief Complaint Chief Complaint  Patient presents with  . Headache    HPI Ryan Hopkins is a 66 y.o. male with a PMHx of HTN, chronic back pain, prostate CA, who presents to the Emergency Department complaining of intermittent, sharp, left temporal HA onset 2 days ago. Pt is having associated symptoms of vision change. Pt notes that he recently changed from his prescription glasses to reading glasses prior to the onset of his symptoms. He is having associated symptoms of diarrhea x 2 episodes today. He hasn't tried any medications for the relief for his symptoms. He denies hitting his head, recent trauma, rash, neck pain, fever, chills, dysuria, cough, CP, abdominal pain, and any other symptoms.   Pt secondarily complains of sore to the roof of his mouth onset 2 weeks ago. Pt reports that the sore to the roof of his mouth is due to wearing his dental partials. Pt hasn't tried any medications for the relief of his symptoms. Pt denies bleeding from the affected area and any other symptoms.   Pt thirdly complains of gradual onset lower back pain that radiates to his BLE onset 1 week ago. Pt states that he attempted to help someone who had fallen prior to the onset of his symptoms. Pt notes that he has had back surgery in 2007 with numerous exacerbations of his lower back pain since the surgery. Pt hasn't tried any medications for the relief of his symptoms. Pt denies any other symptoms.    The history is provided by the patient. No language interpreter was used.    Past Medical History:  Diagnosis Date  . Anal fissure   . Back pain, chronic   . COPD (chronic  obstructive pulmonary disease) ([place])   . Degenerative arthritis of spine   . Diabetes mellitus without complication ([place])   . Diverticulitis   . Emphysema   . Enlarged prostate   . Heart murmur   . Hypertension   . Nocturia   . Prostate cancer ([place]) 11/2013  . TIA (transient ischemic attack)     Patient Active Problem List   Diagnosis Date Noted  . Acute chest pain   . Chest pain 08/30/2015  . Facial droop 08/30/2015  . AKI (acute kidney injury) ([place]) 08/30/2015  . DM (diabetes mellitus) ([place]) 08/30/2015  . OSA (obstructive sleep apnea) 08/12/2015  . COPD (chronic obstructive pulmonary disease) ([place]) 08/12/2015  . Prostate cancer ([place]) 03/16/2014    Past Surgical History:  Procedure Laterality Date  . INGUINAL HERNIA REPAIR Right years ago  . LUMBAR DISC SURGERY  2007   L4-L5  . LYMPHADENECTOMY Bilateral 03/16/2014   Procedure: LYMPHADENECTOMY;  Surgeon: Ardis Hughs, MD;  Location: [facility];  Service: Urology;  Laterality: Bilateral;  . ROBOT ASSISTED LAPAROSCOPIC RADICAL PROSTATECTOMY N/A 03/16/2014   Procedure: ROBOTIC ASSISTED LAPAROSCOPIC RADICAL PROSTATECTOMY;  Surgeon: Ardis Hughs, MD;  Location: [facility];  Service: Urology;  Laterality: N/A;       Home Medications    Prior to Admission medications   Medication Sig Start Date End Date Taking? Authorizing Provider  albuterol (PROVENTIL HFA;VENTOLIN HFA) 108 (90 BASE) MCG/ACT inhaler Inhale 2 puffs into the lungs  every 6 (six) hours as needed for wheezing or shortness of breath (wheezing).     Historical Provider, MD  aspirin EC 325 MG EC tablet Take 1 tablet (325 mg total) by mouth daily. 09/02/15   Reyne Dumas, MD  lisinopril (PRINIVIL,ZESTRIL) 20 MG tablet Take 1 tablet (20 mg total) by mouth daily. 09/09/15   Reyne Dumas, MD  metFORMIN (GLUCOPHAGE) 500 MG tablet Take 1 tablet (500 mg total) by mouth 2 (two) times daily with a meal. 09/09/15   Reyne Dumas, MD  nicotine (NICODERM CQ - DOSED IN MG/24 HOURS)  21 mg/24hr patch Place 1 patch (21 mg total) onto the skin daily. 09/02/15   Reyne Dumas, MD  ondansetron (ZOFRAN-ODT) 4 MG disintegrating tablet Take 4 mg by mouth every 8 (eight) hours as needed for nausea or vomiting.    Historical Provider, MD  oxyCODONE-acetaminophen (PERCOCET) 10-325 MG tablet Take 1 tablet by mouth every 4 (four) hours as needed for pain.    Historical Provider, MD  pantoprazole (PROTONIX) 40 MG tablet Take 1 tablet (40 mg total) by mouth daily. 09/02/15   Reyne Dumas, MD  zolpidem (AMBIEN) 10 MG tablet Take 5 mg by mouth at bedtime as needed for sleep.    Historical Provider, MD    Family History Family History  Problem Relation Age of Onset  . Colon cancer Sister   . Prostate cancer Father   . Diabetes Mother   . Heart failure Mother   . Cancer Mother     cervical    Social History Social History  Substance Use Topics  . Smoking status: Current Every Day Smoker    Packs/day: 0.10    Years: 20.00    Last attempt to quit: 01/16/2014  . Smokeless tobacco: Never Used  . Alcohol use Yes     Comment: Occasional beer     Allergies   Vicodin [hydrocodone-acetaminophen]   Review of Systems Review of Systems  Constitutional: Negative for fever.  HENT:       +sore to roof of mouth without bleeding  Eyes: Positive for visual disturbance.  Cardiovascular: Negative for chest pain.  Gastrointestinal: Positive for diarrhea. Negative for abdominal pain.  Musculoskeletal: Positive for back pain (lower). Negative for neck pain.  Skin: Negative for rash.  Neurological: Positive for headaches.     Physical Exam Updated Vital Signs BP (!) 154/104 (BP Location: Right Arm)   Pulse 101   Temp 97.5 F (36.4 C) (Oral)   Resp 18   Ht 5\' 8"  (1.727 m)   Wt 210 lb (95.3 kg)   SpO2 100%   BMI 31.93 kg/m   Physical Exam  Constitutional: He is oriented to person, place, and time. He appears well-developed and well-nourished. No distress.  HENT:  Head:  Normocephalic and atraumatic.  Right Ear: External ear normal.  Left Ear: External ear normal.  Nose: Nose normal.  Mouth/Throat: Oropharynx is clear and moist. No oropharyngeal exudate.  Area of irritation the is red and white to the central upper palate. Non-bleeding. Not an unlcer. No facial droop. No numbness. No temporal tenderness. Left superior forehead tenderness.   Eyes: Conjunctivae and EOM are normal. Pupils are equal, round, and reactive to light.  Neck: Normal range of motion. Neck supple.  Pulmonary/Chest: No stridor. No respiratory distress.  Abdominal: Soft. There is no tenderness. There is no rebound and no guarding.  Musculoskeletal: He exhibits no edema.  Lower back with bilateral paraspinal tenderness more than midline tenderness. +SLR to bilateral  legs.   Neurological: He is alert and oriented to person, place, and time. He displays normal reflexes. No cranial nerve deficit. He exhibits normal muscle tone. Coordination normal.  Skin: Skin is warm. No rash noted. He is not diaphoretic. No erythema.     ED Treatments / Results  DIAGNOSTIC STUDIES: Oxygen Saturation is 100% on RA, nl by my interpretation.    COORDINATION OF CARE: 12:41 PM Discussed treatment plan with pt at bedside which includes percocet, flexeril, benadryl, compazine, and pt agreed to plan.  3:10 PM-Pt reassessed and his lower back pain symptoms have improved, while his headache is still a 8-9/10. Will treat with a migraine cocktail.   Procedures Procedures (including critical care time)  Medications Ordered in ED Medications  oxyCODONE-acetaminophen (PERCOCET/ROXICET) 5-325 MG per tablet 1 tablet (1 tablet Oral Given 05/19/16 1301)  cyclobenzaprine (FLEXERIL) tablet 10 mg (10 mg Oral Given 05/19/16 1301)     Initial Impression / Assessment and Plan / ED Course  I have reviewed the triage vital signs and the nursing notes.   Clinical Course    Ryan Hopkins is a 66 y.o. male with a PMHx of  HTN, chronic back pain, prostate CA, who presents to the Emergency Department complaining of headache, mouth pain, and low back pain. She says his low back pain occurred after he helped a friend who fell from a walker. He reports his mouth pain has been going on for the last 2 weeks and is associated with his dentures. He says his headache has been going on for the last few days and seems to be associated since he had a change in his glasses.  History and exam are seen above. On exam, patient had tenderness in the left upper forehead/scalp area. No temporal tenderness. No temporal artery tenderness. No visual abnormalities. No rashes or Hutchinson sign on the nose. No abnormality with coordination, sensation, or strength in all extremities and face. In mouth, patient has irritation on the top of his mouth. It is not bleeding. No vesicles or pustules. No trismus or area concerning for PTA. Chest was nontender and lungs were clear. Abdomen nontender. Low back was tender in the paraspinal areas.  Based on patient's exam, suspect patient pulled his back while helping his friend. He says that this is similar to when he has hurt his back in the past although he says the symptoms started the day following his use. No evidence of midline tenderness or popping injury. We'll hold on imaging at this time and treat for likely musculoskeletal discomfort. No red flags for cauda equina. No loss of bowel or bladder function.  Patient will be given pain medicine and muscle relaxant to see if it will help. Suspect a tension type headache from glasses changing. Temporal arteritis considered however, patient is not tender in the temporal area. Headache is mild to moderate. We'll see if the pain medication helps this versus needing a headache cocktail. Foremouth, will recommend salt water swishes and avoiding his dentures 1 not necessary for eating. Suspect this is irritation from his dentures. No evidence of infection at this  time.  Anticipate reassessment following medications  On reassessment, patient reported significant improvement in his back pain and headache. He is requesting discharged for outpatient management. Given patients reassuring exam and reassuring improvement in symptoms, suspected musculoskeletal back pain from helping his friend. Do not suspect stroke.  Patient given instructions to follow up with PCP for fur and watch for signs and symptoms of infection in  his mouth.  Patient instructed to find his other glasses to help prevent headache As this may be related.  Patient understood strict return precautions. Patient had no other questions or concerns and patient was discharged in good condition.   Final Clinical Impressions(s) / ED Diagnoses   Final diagnoses:  Nonintractable headache, unspecified chronicity pattern, unspecified headache type  Low back pain with bilateral sciatica, unspecified back pain laterality, unspecified chronicity    New Prescriptions Discharge Medication List as of 05/19/2016  3:56 PM    START taking these medications   Details  cyclobenzaprine (FLEXERIL) 10 MG tablet Take 1 tablet (10 mg total) by mouth 2 (two) times daily as needed for muscle spasms., Starting Tue 05/19/2016, Print    oxyCODONE-acetaminophen (PERCOCET/ROXICET) 5-325 MG tablet Take 1 tablet by mouth every 6 (six) hours as needed for severe pain., Starting Tue 05/19/2016, Print       I personally performed the services described in this documentation, which was scribed in my presence. The recorded information has been reviewed and is accurate.  Clinical Impression: 1. Nonintractable headache, unspecified chronicity pattern, unspecified headache type   2. Low back pain with bilateral sciatica, unspecified back pain laterality, unspecified chronicity     Disposition: Discharge  Condition: Good  I have discussed the results, Dx and Tx plan with the pt(& family if present). He/she/they expressed  understanding and agree(s) with the plan. Discharge instructions discussed at great length. Strict return precautions discussed and pt &/or family have verbalized understanding of the instructions. No further questions at time of discharge.    Discharge Medication List as of 05/19/2016  3:56 PM    START taking these medications   Details  cyclobenzaprine (FLEXERIL) 10 MG tablet Take 1 tablet (10 mg total) by mouth 2 (two) times daily as needed for muscle spasms., Starting Tue 05/19/2016, Print    oxyCODONE-acetaminophen (PERCOCET/ROXICET) 5-325 MG tablet Take 1 tablet by mouth every 6 (six) hours as needed for severe pain., Starting Tue 05/19/2016, Print        Follow Up: [place] [phone] [place] [address] [id number] [place] [phone]  If symptoms worsen      Courtney Paris, MD 05/19/16 [phone]

## 2016-05-19 NOTE — Discharge Instructions (Signed)
Please take the pain medicine and muscle relaxant to help your low back pain. Please use saltwater swatches for your mouth discomfort. Please remove her dentures and only use when chewing due to irritation in her mouth. Please stay hydrated and rest to not aggravate her back. Please follow-up with a primary care physician for further management. If symptoms worsen, please return to the nearest emergency department.

## 2016-05-19 NOTE — ED Notes (Signed)
This RN went in to give pt medications for his headache and pt was dressed and states "Im ready to go". Pt declines medications at this time.

## 2016-05-19 NOTE — ED Triage Notes (Addendum)
Per Pt, Pt noticed a sore on the roof of his mouth a couple days ago. Pt reports having a shooting pain in the left side of his head that started three days ago. Pt reports some bilateral numbness on the back of his legs that subsided. Denies N/V, but reports one episode of diarrhea this morning. Pt complains of lower back pain with radiation to the bilateral legs

## 2016-05-25 ENCOUNTER — Emergency Department (HOSPITAL_COMMUNITY)
Admission: EM | Admit: 2016-05-25 | Discharge: 2016-05-25 | Disposition: A | Discharge status: Home / Self Care | Payer: Medicare Other | Attending: Emergency Medicine | Admitting: Emergency Medicine

## 2016-05-25 ENCOUNTER — Encounter (HOSPITAL_COMMUNITY): Payer: Self-pay | Admitting: Emergency Medicine

## 2016-05-25 DIAGNOSIS — R350 Frequency of micturition: Secondary | ICD-10-CM | POA: Diagnosis not present

## 2016-05-25 DIAGNOSIS — Z5321 Procedure and treatment not carried out due to patient leaving prior to being seen by health care provider: Secondary | ICD-10-CM | POA: Diagnosis not present

## 2016-05-25 LAB — URINALYSIS, ROUTINE W REFLEX MICROSCOPIC
Bilirubin Urine: NEGATIVE
Glucose, UA: NEGATIVE mg/dL
Ketones, ur: NEGATIVE mg/dL
Nitrite: NEGATIVE
PROTEIN: NEGATIVE mg/dL
Specific Gravity, Urine: 1.015 (ref 1.005–1.030)
pH: 6 (ref 5.0–8.0)

## 2016-05-25 NOTE — ED Notes (Signed)
Pt left. 

## 2016-05-25 NOTE — ED Triage Notes (Signed)
Pt states that he has been having to wear a depends for 2 days due to leaking.  Pt states that he has had pain and bleeding when urinating.

## 2016-05-26 ENCOUNTER — Emergency Department (HOSPITAL_COMMUNITY)
Admission: EM | Admit: 2016-05-26 | Discharge: 2016-05-26 | Disposition: A | Discharge status: Home / Self Care | Payer: Medicare Other | Attending: Emergency Medicine | Admitting: Emergency Medicine

## 2016-05-26 ENCOUNTER — Encounter (HOSPITAL_COMMUNITY): Payer: Self-pay | Admitting: Emergency Medicine

## 2016-05-26 ENCOUNTER — Encounter: Payer: Self-pay | Admitting: Pediatric Intensive Care

## 2016-05-26 DIAGNOSIS — Z7984 Long term (current) use of oral hypoglycemic drugs: Secondary | ICD-10-CM | POA: Insufficient documentation

## 2016-05-26 DIAGNOSIS — Z7982 Long term (current) use of aspirin: Secondary | ICD-10-CM | POA: Insufficient documentation

## 2016-05-26 DIAGNOSIS — R300 Dysuria: Secondary | ICD-10-CM | POA: Diagnosis present

## 2016-05-26 DIAGNOSIS — I160 Hypertensive urgency: Secondary | ICD-10-CM | POA: Insufficient documentation

## 2016-05-26 DIAGNOSIS — R519 Headache, unspecified: Secondary | ICD-10-CM

## 2016-05-26 DIAGNOSIS — R51 Headache: Secondary | ICD-10-CM | POA: Diagnosis not present

## 2016-05-26 DIAGNOSIS — F17200 Nicotine dependence, unspecified, uncomplicated: Secondary | ICD-10-CM | POA: Insufficient documentation

## 2016-05-26 DIAGNOSIS — Z8546 Personal history of malignant neoplasm of prostate: Secondary | ICD-10-CM | POA: Diagnosis not present

## 2016-05-26 DIAGNOSIS — Z8673 Personal history of transient ischemic attack (TIA), and cerebral infarction without residual deficits: Secondary | ICD-10-CM | POA: Insufficient documentation

## 2016-05-26 DIAGNOSIS — E119 Type 2 diabetes mellitus without complications: Secondary | ICD-10-CM | POA: Insufficient documentation

## 2016-05-26 DIAGNOSIS — N390 Urinary tract infection, site not specified: Secondary | ICD-10-CM | POA: Diagnosis not present

## 2016-05-26 DIAGNOSIS — J449 Chronic obstructive pulmonary disease, unspecified: Secondary | ICD-10-CM | POA: Diagnosis not present

## 2016-05-26 LAB — BASIC METABOLIC PANEL
ANION GAP: 8 (ref 5–15)
BUN: 13 mg/dL (ref 6–20)
CALCIUM: 10 mg/dL (ref 8.9–10.3)
CO2: 27 mmol/L (ref 22–32)
Chloride: 102 mmol/L (ref 101–111)
Creatinine, Ser: 1.23 mg/dL (ref 0.61–1.24)
GFR calc non Af Amer: 59 mL/min — ABNORMAL LOW (ref >60)
GLUCOSE: 99 mg/dL (ref 65–99)
POTASSIUM: 4.2 mmol/L (ref 3.5–5.1)
Sodium: 137 mmol/L (ref 135–145)

## 2016-05-26 MED ORDER — LISINOPRIL 20 MG PO TABS: 20.0000 mg | ORAL_TABLET | Freq: Every day | ORAL | 0 refills | Status: AC

## 2016-05-26 MED ORDER — LISINOPRIL 10 MG PO TABS: 10.0000 mg | ORAL_TABLET | Freq: Once | ORAL | Status: DC

## 2016-05-26 MED ORDER — IBUPROFEN 600 MG PO TABS: 600.0000 mg | ORAL_TABLET | Freq: Four times a day (QID) | ORAL | 0 refills | Status: DC | PRN

## 2016-05-26 MED ORDER — SULFAMETHOXAZOLE-TRIMETHOPRIM 800-160 MG PO TABS
1.0000 | ORAL_TABLET | Freq: Once | ORAL | Status: AC
  Administered 2016-05-26: 1 via ORAL
  Filled 2016-05-26: qty 1

## 2016-05-26 MED ORDER — SULFAMETHOXAZOLE-TRIMETHOPRIM 800-160 MG PO TABS: 1.0000 | ORAL_TABLET | Freq: Two times a day (BID) | ORAL | 0 refills | Status: AC

## 2016-05-26 MED ORDER — LISINOPRIL 20 MG PO TABS
20.0000 mg | ORAL_TABLET | Freq: Once | ORAL | Status: AC
  Administered 2016-05-26: 20 mg via ORAL
  Filled 2016-05-26: qty 1

## 2016-05-26 NOTE — ED Triage Notes (Signed)
Patient presents to ED for assessment of his blood pressure, associated headache, and also urinary discomfort.  Pt states he normally takes medicines for BP, but he is currently out (x2 days).  Pt sts he developed a headache yesterday, and noted his BP was high.  Pt also c/o burning with urination, and noted a pink color to his urine.

## 2016-05-26 NOTE — ED Provider Notes (Signed)
[facility] DEPT Provider Note   CSN: [id number] Arrival date & time: 05/26/16  [id number]     History   Chief Complaint Chief Complaint  Patient presents with  . Hypertension  . Headache  . Dysuria    HPI Ryan Hopkins is a 66 y.o. male.  HPI Patient presents with concern of hypertension, generalized discomfort, dysuria. Patient acknowledges multiple medical issues, states that he has not had blood pressure medication for several days. Over the past 4 or 5 days he has had mild, and consistent headache, as well as generalized discomfort. 2 days ago patient also had one episode of dysuria, hematuria, this is been unsustained No nausea, vomiting, diarrhea, fever, chills, chest pain, abdominal pain that is sustained. There is occasional suprapubic discomfort. Patient went to our affiliated facility yesterday, left prior to evaluation.   Past Medical History:  Diagnosis Date  . Anal fissure   . Back pain, chronic   . COPD (chronic obstructive pulmonary disease) ([place])   . Degenerative arthritis of spine   . Diabetes mellitus without complication ([place])   . Diverticulitis   . Emphysema   . Enlarged prostate   . Heart murmur   . Hypertension   . Nocturia   . Prostate cancer ([place]) 11/2013  . TIA (transient ischemic attack)     Patient Active Problem List   Diagnosis Date Noted  . Acute chest pain   . Chest pain 08/30/2015  . Facial droop 08/30/2015  . AKI (acute kidney injury) ([place]) 08/30/2015  . DM (diabetes mellitus) ([place]) 08/30/2015  . OSA (obstructive sleep apnea) 08/12/2015  . COPD (chronic obstructive pulmonary disease) ([place]) 08/12/2015  . Prostate cancer ([place]) 03/16/2014    Past Surgical History:  Procedure Laterality Date  . INGUINAL HERNIA REPAIR Right years ago  . LUMBAR DISC SURGERY  2007   L4-L5  . LYMPHADENECTOMY Bilateral 03/16/2014   Procedure: LYMPHADENECTOMY;  Surgeon: Ardis Hughs, MD;  Location: [facility];  Service: Urology;  Laterality:  Bilateral;  . ROBOT ASSISTED LAPAROSCOPIC RADICAL PROSTATECTOMY N/A 03/16/2014   Procedure: ROBOTIC ASSISTED LAPAROSCOPIC RADICAL PROSTATECTOMY;  Surgeon: Ardis Hughs, MD;  Location: [facility];  Service: Urology;  Laterality: N/A;       Home Medications    Prior to Admission medications   Medication Sig Start Date End Date Taking? Authorizing Provider  albuterol (PROVENTIL HFA;VENTOLIN HFA) 108 (90 BASE) MCG/ACT inhaler Inhale 2 puffs into the lungs every 6 (six) hours as needed for wheezing or shortness of breath (wheezing).     Historical Provider, MD  aspirin EC 325 MG EC tablet Take 1 tablet (325 mg total) by mouth daily. 09/02/15   Reyne Dumas, MD  cyclobenzaprine (FLEXERIL) 10 MG tablet Take 1 tablet (10 mg total) by mouth 2 (two) times daily as needed for muscle spasms. 05/19/16   Gwenyth Allegra Tegeler, MD  Fluticasone-Salmeterol (ADVAIR DISKUS) 250-50 MCG/DOSE AEPB Inhale 2 puffs into the lungs 2 (two) times daily. 02/12/16   Historical Provider, MD  lisinopril (PRINIVIL,ZESTRIL) 20 MG tablet Take 1 tablet (20 mg total) by mouth daily. 09/09/15   Reyne Dumas, MD  metFORMIN (GLUCOPHAGE) 500 MG tablet Take 1 tablet (500 mg total) by mouth 2 (two) times daily with a meal. 09/09/15   Reyne Dumas, MD  oxyCODONE-acetaminophen (PERCOCET) 10-325 MG tablet Take 1 tablet by mouth every 4 (four) hours as needed for pain.    Historical Provider, MD  oxyCODONE-acetaminophen (PERCOCET/ROXICET) 5-325 MG tablet Take 1 tablet by mouth every 6 (six) hours  as needed for severe pain. 05/19/16   Courtney Paris, MD    Family History Family History  Problem Relation Age of Onset  . Colon cancer Sister   . Prostate cancer Father   . Diabetes Mother   . Heart failure Mother   . Cancer Mother     cervical    Social History Social History  Substance Use Topics  . Smoking status: Current Some Day Smoker    Packs/day: 0.10    Years: 20.00    Last attempt to quit: 01/16/2014  . Smokeless  tobacco: Never Used  . Alcohol use Yes     Comment: Occasional beer     Allergies   Vicodin [hydrocodone-acetaminophen]   Review of Systems Review of Systems  Constitutional:       Per HPI, otherwise negative  HENT:       Per HPI, otherwise negative  Respiratory:       Per HPI, otherwise negative  Cardiovascular:       Per HPI, otherwise negative  Gastrointestinal: Negative for vomiting.  Endocrine:       Negative aside from HPI  Genitourinary:       Neg aside from HPI   Musculoskeletal:       Per HPI, otherwise negative  Skin: Negative.   Neurological: Negative for syncope.     Physical Exam Updated Vital Signs BP (!) 166/113   Pulse 76   Temp 97.7 F (36.5 C) (Oral)   Resp 15   SpO2 100%   Physical Exam  Constitutional: He is oriented to person, place, and time. He appears well-developed. No distress.  HENT:  Head: Normocephalic and atraumatic.  Eyes: Conjunctivae and EOM are normal.  Cardiovascular: Normal rate and regular rhythm.   Pulmonary/Chest: Effort normal. No stridor. No respiratory distress.  Abdominal: He exhibits no distension. There is no tenderness.  Musculoskeletal: He exhibits no edema.  Neurological: He is alert and oriented to person, place, and time.  Skin: Skin is warm and dry.  Psychiatric: He has a normal mood and affect.  Nursing note and vitals reviewed.    ED Treatments / Results  Labs (all labs ordered are listed, but only abnormal results are displayed) Labs Reviewed  BASIC METABOLIC PANEL   Urinalysis from yesterday available, consistent with possible urinary tract infection. Given the patient's history of prostate cancer, his endorsement of using adult diapers, there suspicion for infection, patient will start antibiotics, follow up with urology.  Procedures Procedures (including critical care time)  Medications Ordered in ED Medications  lisinopril (PRINIVIL,ZESTRIL) tablet 20 mg (not administered)     Initial  Impression / Assessment and Plan / ED Course  I have reviewed the triage vital signs and the nursing notes.  Pertinent labs & imaging results that were available during my care of the patient were reviewed by me and considered in my medical decision making (see chart for details).    Well-appearing male presents with concern of hypertension, headache, generalized discomfort and dysuria. Patient has history of hypertension, prostate cancer, but here, he is awake and alert, mildly hypertensive, with no evidence for and organ dysfunction. No evidence for bacteremia or sepsis. Patient will require outpatient follow-up with urology, after initiation of antibiotics. Patient provided antihypertensive, and encouraged to follow-up with primary care.    Carmin Muskrat, MD 05/26/16 1235

## 2016-05-27 MED FILL — LISINOPRIL 20 MG TABLET: 20 | 30 days supply | Qty: 30 | Fill #0

## 2016-05-27 MED FILL — SULFAMETHOXAZOLE/TMP DS TAB: 800-160 | 7 days supply | Qty: 14 | Fill #0

## 2016-05-28 ENCOUNTER — Encounter: Payer: Self-pay | Admitting: Pediatric Intensive Care

## 2016-05-28 NOTE — Congregational Nurse Program (Signed)
Congregational Nurse Program Note  Date of Encounter: 05/26/2016  Past Medical History: Past Medical History:  Diagnosis Date  . Anal fissure   . Back pain, chronic   . COPD (chronic obstructive pulmonary disease) ([place])   . Degenerative arthritis of spine   . Diabetes mellitus without complication ([place])   . Diverticulitis   . Emphysema   . Enlarged prostate   . Heart murmur   . Hypertension   . Nocturia   . Prostate cancer ([place]) 11/2013  . TIA (transient ischemic attack)     Encounter Details:     CNP Questionnaire - 05/26/16 0900      Patient Demographics   Is this a new or existing patient? Existing   Patient is considered a/an Not Applicable   Race African-American/Black     Patient Assistance   Location of Patient Assistance [facility]   Patient's financial/insurance status Medicaid;Low Income   Uninsured Patient ([facility]) No   Patient referred to apply for the following financial assistance Not Applicable   Food insecurities addressed Not Applicable   Transportation assistance Yes   Type of Assistance Bus Pass Given   Educational health offerings Not Applicable     Encounter Details   Primary purpose of visit Acute Illness/Condition Visit   Was an Emergency Department visit averted? No   Patient referred to Emergency Department   Was a mental health screening completed? (GAINS tool) No   Does patient have dental issues? No   Does patient have vision issues? No   Does your patient have an abnormal blood pressure today? No   Since previous encounter, have you referred patient for abnormal blood pressure that resulted in a new diagnosis or medication change? No   Does your patient have an abnormal blood glucose today? No   Since previous encounter, have you referred patient for abnormal blood glucose that resulted in a new diagnosis or medication change? No   Was there a life-saving intervention made? No     Client states that he thinks he has a  UTI. States that he is "peeing blood". CN recommended evaluation. Client states that he will go to ED.

## 2016-06-04 NOTE — Congregational Nurse Program (Signed)
Congregational Nurse Program Note  Date of Encounter: 05/27/2016  Past Medical History: Past Medical History:  Diagnosis Date  . Anal fissure   . Back pain, chronic   . COPD (chronic obstructive pulmonary disease) ([place])   . Degenerative arthritis of spine   . Diabetes mellitus without complication ([place])   . Diverticulitis   . Emphysema   . Enlarged prostate   . Heart murmur   . Hypertension   . Nocturia   . Prostate cancer ([place]) 11/2013  . TIA (transient ischemic attack)     Encounter Details:     CNP Questionnaire - 05/27/16 1021      Patient Demographics   Is this a new or existing patient? Existing   Patient is considered a/an Not Applicable   Race African-American/Black     Patient Assistance   Location of Patient Assistance [facility]   Patient's financial/insurance status Medicaid;Low Income   Uninsured Patient ([facility]) No   Patient referred to apply for the following financial assistance Not Applicable   Food insecurities addressed Not Applicable   Transportation assistance Yes   Type of Assistance [employer] Given   Assistance securing medications Yes   Type of Assistance [facility]   Educational health offerings Not Applicable     Encounter Details   Primary purpose of visit Acute Illness/Condition Visit   Was an Emergency Department visit averted? No   Does patient have a medical provider? Yes   Patient referred to Emergency Department   Was a mental health screening completed? (GAINS tool) No   Does patient have dental issues? No   Does patient have vision issues? No   Does your patient have an abnormal blood pressure today? No   Since previous encounter, have you referred patient for abnormal blood pressure that resulted in a new diagnosis or medication change? No   Does your patient have an abnormal blood glucose today? No   Since previous encounter, have you referred patient for abnormal blood glucose that resulted in a new diagnosis  or medication change? No   Was there a life-saving intervention made? No     Needed assistance with acquiring medications prescribed during ED visit.  Prescriptions filled at [facility] and delivered to client

## 2016-06-18 NOTE — Congregational Nurse Program (Signed)
Congregational Nurse Program Note  Date of Encounter: 05/05/2016  Past Medical History: Past Medical History:  Diagnosis Date  . Anal fissure   . Back pain, chronic   . COPD (chronic obstructive pulmonary disease) ([place])   . Degenerative arthritis of spine   . Diabetes mellitus without complication ([place])   . Diverticulitis   . Emphysema   . Enlarged prostate   . Heart murmur   . Hypertension   . Nocturia   . Prostate cancer ([place]) 11/2013  . TIA (transient ischemic attack)     Encounter Details:     CNP Questionnaire - 05/26/16 0900      Patient Demographics   Is this a new or existing patient? Existing   Patient is considered a/an Not Applicable   Race African-American/Black     Patient Assistance   Location of Patient Assistance [facility]   Patient's financial/insurance status Medicaid;Low Income   Uninsured Patient ([facility]) No   Patient referred to apply for the following financial assistance Not Applicable   Food insecurities addressed Not Applicable   Transportation assistance Yes   Type of Assistance Bus Pass Given   Assistance securing medications No   Educational health offerings Not Applicable     Encounter Details   Primary purpose of visit Acute Illness/Condition Visit   Was an Emergency Department visit averted? No   Does patient have a medical provider? Yes   Patient referred to Emergency Department   Was a mental health screening completed? (GAINS tool) No   Does patient have dental issues? No   Does patient have vision issues? No   Does your patient have an abnormal blood pressure today? No   Since previous encounter, have you referred patient for abnormal blood pressure that resulted in a new diagnosis or medication change? No   Does your patient have an abnormal blood glucose today? No   Since previous encounter, have you referred patient for abnormal blood glucose that resulted in a new diagnosis or medication change? No   Was there a  life-saving intervention made? No     BP check- client states that he has history of hypertension. States he has headache today. He is nearly out of medication but feels that [facility] will bridge him until he gets to his PCP. Will foloow up with CN as needed.

## 2016-06-18 NOTE — Congregational Nurse Program (Signed)
Congregational Nurse Program Note  Date of Encounter: 05/11/2016  Past Medical History: Past Medical History:  Diagnosis Date  . Anal fissure   . Back pain, chronic   . COPD (chronic obstructive pulmonary disease) ([place])   . Degenerative arthritis of spine   . Diabetes mellitus without complication ([place])   . Diverticulitis   . Emphysema   . Enlarged prostate   . Heart murmur   . Hypertension   . Nocturia   . Prostate cancer ([place]) 11/2013  . TIA (transient ischemic attack)     Encounter Details:     CNP Questionnaire - 05/27/16 1021      Patient Demographics   Is this a new or existing patient? Existing   Patient is considered a/an Not Applicable   Race African-American/Black     Patient Assistance   Location of Patient Assistance [facility]   Patient's financial/insurance status Medicaid;Low Income   Uninsured Patient ([facility]) No   Patient referred to apply for the following financial assistance Not Applicable   Food insecurities addressed Not Applicable   Transportation assistance Yes   Type of Assistance [employer] Given   Assistance securing medications Yes   Type of Assistance [facility]   Educational health offerings Not Applicable     Encounter Details   Primary purpose of visit Acute Illness/Condition Visit   Was an Emergency Department visit averted? No   Does patient have a medical provider? Yes   Patient referred to Emergency Department   Was a mental health screening completed? (GAINS tool) No   Does patient have dental issues? No   Does patient have vision issues? No   Does your patient have an abnormal blood pressure today? No   Since previous encounter, have you referred patient for abnormal blood pressure that resulted in a new diagnosis or medication change? No   Does your patient have an abnormal blood glucose today? No   Since previous encounter, have you referred patient for abnormal blood glucose that resulted in a new diagnosis  or medication change? No   Was there a life-saving intervention made? No     BP check- needs bus passes for appointment at [facility] clinic tomorrow.

## 2016-06-20 NOTE — Congregational Nurse Program (Signed)
Congregational Nurse Program Note  Date of Encounter: 05/19/2016  Past Medical History: Past Medical History:  Diagnosis Date  . Anal fissure   . Back pain, chronic   . COPD (chronic obstructive pulmonary disease) ([place])   . Degenerative arthritis of spine   . Diabetes mellitus without complication ([place])   . Diverticulitis   . Emphysema   . Enlarged prostate   . Heart murmur   . Hypertension   . Nocturia   . Prostate cancer ([place]) 11/2013  . TIA (transient ischemic attack)     Encounter Details:     CNP Questionnaire - 05/27/16 1021      Patient Demographics   Is this a new or existing patient? Existing   Patient is considered a/an Not Applicable   Race African-American/Black     Patient Assistance   Location of Patient Assistance [facility]   Patient's financial/insurance status Medicaid;Low Income   Uninsured Patient ([facility]) No   Patient referred to apply for the following financial assistance Not Applicable   Food insecurities addressed Not Applicable   Transportation assistance Yes   Type of Assistance [employer] Given   Assistance securing medications Yes   Type of Assistance [facility]   Educational health offerings Not Applicable     Encounter Details   Primary purpose of visit Acute Illness/Condition Visit   Was an Emergency Department visit averted? No   Does patient have a medical provider? Yes   Patient referred to Emergency Department   Was a mental health screening completed? (GAINS tool) No   Does patient have dental issues? No   Does patient have vision issues? No   Does your patient have an abnormal blood pressure today? No   Since previous encounter, have you referred patient for abnormal blood pressure that resulted in a new diagnosis or medication change? No   Does your patient have an abnormal blood glucose today? No   Since previous encounter, have you referred patient for abnormal blood glucose that resulted in a new diagnosis  or medication change? No   Was there a life-saving intervention made? No    Client states he has a headache which feels like a "jolt" going up the side of his head. He has taken Ibuprofen with some relief but states that he is going to ED for care. Encouraged client to follow up with [facility] as they are his normal PCP.

## 2016-06-26 ENCOUNTER — Encounter: Payer: Self-pay | Admitting: Pediatric Intensive Care

## 2016-06-30 ENCOUNTER — Encounter: Payer: Self-pay | Admitting: Pediatric Intensive Care

## 2016-07-05 NOTE — Congregational Nurse Program (Signed)
Congregational Nurse Program Note  Date of Encounter: 06/30/2016  Past Medical History: Past Medical History:  Diagnosis Date  . Anal fissure   . Back pain, chronic   . COPD (chronic obstructive pulmonary disease) ([place])   . Degenerative arthritis of spine   . Diabetes mellitus without complication ([place])   . Diverticulitis   . Emphysema   . Enlarged prostate   . Heart murmur   . Hypertension   . Nocturia   . Prostate cancer ([place]) 11/2013  . TIA (transient ischemic attack)     Encounter Details:     CNP Questionnaire - 06/30/16 0800      Patient Demographics   Is this a new or existing patient? Existing   Patient is considered a/an Not Applicable   Race African-American/Black     Patient Assistance   Location of Patient Assistance [facility]   Patient's financial/insurance status Medicaid;Medicare   Uninsured Patient ([facility]) No   Patient referred to apply for the following financial assistance Not Applicable   Food insecurities addressed Not Applicable   Transportation assistance Yes   Type of Assistance Bus Pass Given   Assistance securing medications No   Educational health offerings Navigating the healthcare system     Encounter Details   Primary purpose of visit Chronic Illness/Condition Visit;Education/Health Concerns   Was an Emergency Department visit averted? Not Applicable   Does patient have a medical provider? Yes   Patient referred to Follow up with established PCP   Was a mental health screening completed? (GAINS tool) No   Does patient have dental issues? No   Does patient have vision issues? No   Does your patient have an abnormal blood pressure today? Yes   Since previous encounter, have you referred patient for abnormal blood pressure that resulted in a new diagnosis or medication change? Yes   Does your patient have an abnormal blood glucose today? No   Since previous encounter, have you referred patient for abnormal blood glucose that  resulted in a new diagnosis or medication change? No   Was there a life-saving intervention made? No     BP check. BPelevated this morning. Client states that his stress level is very high due to other health issues. Client states that he continues to have blood in his urine and is seeking a second opinion regarding post-proate surgery. Is frusrtated that he is still having issues with incontinence. Client has PCP appointment this week and will address continued elevated BP.

## 2016-07-06 ENCOUNTER — Ambulatory Visit: Payer: Medicare Other | Admitting: Podiatry

## 2016-07-09 NOTE — Congregational Nurse Program (Signed)
Congregational Nurse Program Note  Date of Encounter: 06/26/2016  Past Medical History: Past Medical History:  Diagnosis Date  . Anal fissure   . Back pain, chronic   . COPD (chronic obstructive pulmonary disease) ([place])   . Degenerative arthritis of spine   . Diabetes mellitus without complication ([place])   . Diverticulitis   . Emphysema   . Enlarged prostate   . Heart murmur   . Hypertension   . Nocturia   . Prostate cancer ([place]) 11/2013  . TIA (transient ischemic attack)     Encounter Details:     CNP Questionnaire - 06/30/16 0800      Patient Demographics   Is this a new or existing patient? Existing   Patient is considered a/an Not Applicable   Race African-American/Black     Patient Assistance   Location of Patient Assistance [facility]   Patient's financial/insurance status Medicaid;Medicare   Uninsured Patient ([facility]) No   Patient referred to apply for the following financial assistance Not Applicable   Food insecurities addressed Not Applicable   Transportation assistance Yes   Type of Assistance Bus Pass Given   Assistance securing medications No   Educational health offerings Navigating the healthcare system     Encounter Details   Primary purpose of visit Chronic Illness/Condition Visit;Education/Health Concerns   Was an Emergency Department visit averted? Not Applicable   Does patient have a medical provider? Yes   Patient referred to Follow up with established PCP   Was a mental health screening completed? (GAINS tool) No   Does patient have dental issues? No   Does patient have vision issues? No   Does your patient have an abnormal blood pressure today? Yes   Since previous encounter, have you referred patient for abnormal blood pressure that resulted in a new diagnosis or medication change? Yes   Does your patient have an abnormal blood glucose today? No   Since previous encounter, have you referred patient for abnormal blood glucose that  resulted in a new diagnosis or medication change? No   Was there a life-saving intervention made? No     BP check.

## 2016-11-03 ENCOUNTER — Ambulatory Visit (INDEPENDENT_AMBULATORY_CARE_PROVIDER_SITE_OTHER): Payer: Medicare Other | Admitting: Podiatry

## 2016-11-03 NOTE — Progress Notes (Signed)
Erroneous encounter no show  

## 2016-12-03 ENCOUNTER — Ambulatory Visit: Payer: Medicare Other | Admitting: Podiatry

## 2017-01-05 ENCOUNTER — Emergency Department
Admission: EM | Admit: 2017-01-05 | Discharge: 2017-01-05 | Disposition: A | Discharge status: Home / Self Care | Payer: Medicare Other | Attending: Emergency Medicine | Admitting: Emergency Medicine

## 2017-01-05 DIAGNOSIS — E119 Type 2 diabetes mellitus without complications: Secondary | ICD-10-CM | POA: Insufficient documentation

## 2017-01-05 DIAGNOSIS — F17200 Nicotine dependence, unspecified, uncomplicated: Secondary | ICD-10-CM | POA: Diagnosis not present

## 2017-01-05 DIAGNOSIS — IMO0002 Reserved for concepts with insufficient information to code with codable children: Secondary | ICD-10-CM

## 2017-01-05 DIAGNOSIS — Z7982 Long term (current) use of aspirin: Secondary | ICD-10-CM | POA: Diagnosis not present

## 2017-01-05 DIAGNOSIS — F1010 Alcohol abuse, uncomplicated: Secondary | ICD-10-CM | POA: Insufficient documentation

## 2017-01-05 DIAGNOSIS — J449 Chronic obstructive pulmonary disease, unspecified: Secondary | ICD-10-CM | POA: Insufficient documentation

## 2017-01-05 NOTE — Discharge Instructions (Signed)
Please seek medical attention for any high fevers, chest pain, shortness of breath, change in behavior, persistent vomiting, bloody stool or any other new or concerning symptoms.  

## 2017-01-05 NOTE — ED Provider Notes (Signed)
[facility] Emergency Department Provider Note    ____________________________________________   I have reviewed the triage vital signs and the nursing notes.   HISTORY  Chief Complaint Alcohol Problem   History limited by: Not Limited   HPI Ryan Hopkins is a 66 y.o. male who presents to the emergency department today because he would like to have detox from alcohol. He states that he would like to have detox because then he could go to [facility] for shelter. The patient states that he talked to [facility] who would not take him since he has insurance. Apparently someone at [facility] told him to come to the emergency department for detox. He denies any complicated withdrawals.    Past Medical History:  Diagnosis Date  . Anal fissure   . Back pain, chronic   . COPD (chronic obstructive pulmonary disease) ([place])   . Degenerative arthritis of spine   . Diabetes mellitus without complication ([place])   . Diverticulitis   . Emphysema   . Enlarged prostate   . Heart murmur   . Hypertension   . Nocturia   . Prostate cancer ([place]) 11/2013  . TIA (transient ischemic attack)     Patient Active Problem List   Diagnosis Date Noted  . Acute chest pain   . Chest pain 08/30/2015  . Facial droop 08/30/2015  . AKI (acute kidney injury) ([place]) 08/30/2015  . DM (diabetes mellitus) ([place]) 08/30/2015  . OSA (obstructive sleep apnea) 08/12/2015  . COPD (chronic obstructive pulmonary disease) ([place]) 08/12/2015  . Prostate cancer ([place]) 03/16/2014    Past Surgical History:  Procedure Laterality Date  . INGUINAL HERNIA REPAIR Right years ago  . LUMBAR DISC SURGERY  2007   L4-L5  . LYMPHADENECTOMY Bilateral 03/16/2014   Procedure: LYMPHADENECTOMY;  Surgeon: Ardis Hughs, MD;  Location: [facility];  Service: Urology;  Laterality: Bilateral;  . ROBOT ASSISTED LAPAROSCOPIC RADICAL PROSTATECTOMY N/A 03/16/2014   Procedure: ROBOTIC ASSISTED LAPAROSCOPIC RADICAL  PROSTATECTOMY;  Surgeon: Ardis Hughs, MD;  Location: [facility];  Service: Urology;  Laterality: N/A;    Prior to Admission medications   Medication Sig Start Date End Date Taking? Authorizing Provider  albuterol (PROVENTIL HFA;VENTOLIN HFA) 108 (90 BASE) MCG/ACT inhaler Inhale 2 puffs into the lungs every 6 (six) hours as needed for wheezing or shortness of breath (wheezing).     [provider]  aspirin EC 325 MG EC tablet Take 1 tablet (325 mg total) by mouth daily. 09/02/15   Reyne Dumas, MD  cyclobenzaprine (FLEXERIL) 10 MG tablet Take 1 tablet (10 mg total) by mouth 2 (two) times daily as needed for muscle spasms. 05/19/16   Tegeler, Gwenyth Allegra, MD  Fluticasone-Salmeterol (ADVAIR DISKUS) 250-50 MCG/DOSE AEPB Inhale 2 puffs into the lungs 2 (two) times daily. 02/12/16   [provider]  ibuprofen (ADVIL,MOTRIN) 600 MG tablet Take 1 tablet (600 mg total) by mouth every 6 (six) hours as needed. 05/26/16   Carmin Muskrat, MD  lisinopril (PRINIVIL,ZESTRIL) 20 MG tablet Take 1 tablet (20 mg total) by mouth daily. 05/26/16   Carmin Muskrat, MD  metFORMIN (GLUCOPHAGE) 500 MG tablet Take 1 tablet (500 mg total) by mouth 2 (two) times daily with a meal. 09/09/15   Reyne Dumas, MD  oxyCODONE-acetaminophen (PERCOCET) 10-325 MG tablet Take 1 tablet by mouth every 4 (four) hours as needed for pain.    [provider]  oxyCODONE-acetaminophen (PERCOCET/ROXICET) 5-325 MG tablet Take 1 tablet by mouth every 6 (six) hours  as needed for severe pain. 05/19/16   Tegeler, Gwenyth Allegra, MD    Allergies Vicodin [hydrocodone-acetaminophen]  Family History  Problem Relation Age of Onset  . Colon cancer Sister   . Prostate cancer Father   . Diabetes Mother   . Heart failure Mother   . Cancer Mother        cervical    Social History Social History  Substance Use Topics  . Smoking status: Current Some Day Smoker    Packs/day: 0.10    Years: 20.00    Last attempt to quit:  01/16/2014  . Smokeless tobacco: Never Used  . Alcohol use Yes     Comment: daly beer 40oz    Review of Systems Constitutional: No fever/chills Eyes: No visual changes. ENT: No sore throat. Cardiovascular: Denies chest pain. Respiratory: Denies shortness of breath. Gastrointestinal: No abdominal pain.  No nausea, no vomiting.  No diarrhea.   Genitourinary: Negative for dysuria. Musculoskeletal: Negative for back pain. Skin: Negative for rash. Neurological: Negative for headaches, focal weakness or numbness.  ____________________________________________   PHYSICAL EXAM:  VITAL SIGNS: ED Triage Vitals [01/05/17 1445]  Enc Vitals Group     BP (!) 164/103     Pulse Rate 92     Resp 17     Temp 98.7 F (37.1 C)     Temp Source Oral     SpO2 99 %     Weight 195 lb (88.5 kg)     Height 5\' 8"  (1.727 m)   Constitutional: Alert and oriented. Well appearing and in no distress. Eyes: Conjunctivae are normal.  ENT   Head: Normocephalic and atraumatic.   Nose: No congestion/rhinnorhea.   Mouth/Throat: Mucous membranes are moist.   Neck: No stridor. Hematological/Lymphatic/Immunilogical: No cervical lymphadenopathy. Cardiovascular: Normal rate, regular rhythm.  No murmurs, rubs, or gallops.  Respiratory: Normal respiratory effort without tachypnea nor retractions. Breath sounds are clear and equal bilaterally. No wheezes/rales/rhonchi. Gastrointestinal: Soft and non tender. No rebound. No guarding.  Genitourinary: Deferred Musculoskeletal: Normal range of motion in all extremities. No lower extremity edema. Neurologic:  Normal speech and language. No gross focal neurologic deficits are appreciated.  Skin:  Skin is warm, dry and intact. No rash noted. Psychiatric: Mood and affect are normal. Speech and behavior are normal. Patient exhibits appropriate insight and judgment.  ____________________________________________    LABS (pertinent  positives/negatives)  None  ____________________________________________   EKG  None  ____________________________________________    RADIOLOGY  None  ____________________________________________   PROCEDURES  Procedures  ____________________________________________   INITIAL IMPRESSION / ASSESSMENT AND PLAN / ED COURSE  Pertinent labs & imaging results that were available during my care of the patient were reviewed by me and considered in my medical decision making (see chart for details).  Patient presents to the emergency department today because of desire for detox. He is not eligible for [facility] given that he has insurance. Will give patient information for [facility].  ____________________________________________   FINAL CLINICAL IMPRESSION(S) / ED DIAGNOSES  Final diagnoses:  Alcohol use disorder [facility])     Note: This dictation was prepared with Dragon dictation. Any transcriptional errors that result from this process are unintentional     Nance Pear, MD 01/05/17 [phone]

## 2017-01-05 NOTE — ED Triage Notes (Signed)
Pt states he is homeless and has a place [facility] if he can detox from alcohol, states he drinks a 40oz of beer and liquor daily.

## 2017-02-11 ENCOUNTER — Emergency Department (HOSPITAL_COMMUNITY): Payer: Medicare Other

## 2017-02-11 ENCOUNTER — Encounter (HOSPITAL_COMMUNITY): Payer: Self-pay | Admitting: *Deleted

## 2017-02-11 ENCOUNTER — Other Ambulatory Visit: Payer: Self-pay

## 2017-02-11 ENCOUNTER — Emergency Department (HOSPITAL_COMMUNITY)
Admission: EM | Admit: 2017-02-11 | Discharge: 2017-02-11 | Disposition: A | Discharge status: Home / Self Care | Payer: Medicare Other | Attending: Emergency Medicine | Admitting: Emergency Medicine

## 2017-02-11 DIAGNOSIS — Z79899 Other long term (current) drug therapy: Secondary | ICD-10-CM | POA: Diagnosis not present

## 2017-02-11 DIAGNOSIS — F17210 Nicotine dependence, cigarettes, uncomplicated: Secondary | ICD-10-CM | POA: Diagnosis not present

## 2017-02-11 DIAGNOSIS — Z7982 Long term (current) use of aspirin: Secondary | ICD-10-CM | POA: Insufficient documentation

## 2017-02-11 DIAGNOSIS — Z7984 Long term (current) use of oral hypoglycemic drugs: Secondary | ICD-10-CM | POA: Insufficient documentation

## 2017-02-11 DIAGNOSIS — M25512 Pain in left shoulder: Secondary | ICD-10-CM

## 2017-02-11 DIAGNOSIS — I10 Essential (primary) hypertension: Secondary | ICD-10-CM | POA: Insufficient documentation

## 2017-02-11 DIAGNOSIS — Z8546 Personal history of malignant neoplasm of prostate: Secondary | ICD-10-CM | POA: Insufficient documentation

## 2017-02-11 DIAGNOSIS — J449 Chronic obstructive pulmonary disease, unspecified: Secondary | ICD-10-CM | POA: Insufficient documentation

## 2017-02-11 DIAGNOSIS — E119 Type 2 diabetes mellitus without complications: Secondary | ICD-10-CM | POA: Diagnosis not present

## 2017-02-11 MED ORDER — HYDROCODONE-ACETAMINOPHEN 5-325 MG PO TABS
1.0000 | ORAL_TABLET | Freq: Once | ORAL | Status: DC
  Filled 2017-02-11: qty 1

## 2017-02-11 MED ORDER — NAPROXEN 500 MG PO TABS: 500.0000 mg | ORAL_TABLET | Freq: Two times a day (BID) | ORAL | 0 refills | Status: DC

## 2017-02-11 MED ORDER — ACETAMINOPHEN 500 MG PO TABS
500.0000 mg | ORAL_TABLET | Freq: Once | ORAL | Status: AC
  Administered 2017-02-11: 500 mg via ORAL
  Filled 2017-02-11: qty 1

## 2017-02-11 MED ORDER — HYDROCODONE-ACETAMINOPHEN 5-325 MG PO TABS: 1.0000 | ORAL_TABLET | Freq: Four times a day (QID) | ORAL | 0 refills | Status: DC | PRN

## 2017-02-11 NOTE — Discharge Instructions (Signed)
Please read instructions below. Apply ice to your shoulder for 20 minutes at a time. You can also apply heat if this provides the with more relief. You can take naprosyn every 12 hours with meals as needed for pain. Schedule an appointment with the orthopedic specialist to follow-up on your injury. Return to the ER for new or concerning symptoms.

## 2017-02-11 NOTE — ED Triage Notes (Signed)
Pt fell 2 weeks when climbing over a couch, lost balance and fell backwards and landed on left side.  Pt has pain moving left arm and states shoulder has been hurting since fall.  No chest pain.

## 2017-02-11 NOTE — ED Provider Notes (Signed)
[facility] DEPT Provider Note   CSN: [id number] Arrival date & time: 02/11/17  4010     History   Chief Complaint Chief Complaint  Patient presents with  . Arm Injury  . Fall    HPI Ryan Hopkins is a 66 y.o. male w PMHx COPD, HTN, TIA, prostate cancer, presenting to ED with persistent left shoulder pain s/p mechanical fall that occurred 2 weeks ago. Patient states 2 weeks ago he lost his balance falling backwards onto his left side. He has had persistent left shoulder pain since that time. He has taken Advil without relief. Pain is worse with raising his arm overhead. Denies head trauma or LOC, numbness or tingling, wounds, or other injuries. No previous injuries to this shoulder.   The history is provided by the patient.    Past Medical History:  Diagnosis Date  . Anal fissure   . Back pain, chronic   . COPD (chronic obstructive pulmonary disease) ([place])   . Degenerative arthritis of spine   . Diabetes mellitus without complication ([place])   . Diverticulitis   . Emphysema   . Enlarged prostate   . Heart murmur   . Hypertension   . Nocturia   . Prostate cancer ([place]) 11/2013  . TIA (transient ischemic attack)     Patient Active Problem List   Diagnosis Date Noted  . Acute chest pain   . Chest pain 08/30/2015  . Facial droop 08/30/2015  . AKI (acute kidney injury) ([place]) 08/30/2015  . DM (diabetes mellitus) ([place]) 08/30/2015  . OSA (obstructive sleep apnea) 08/12/2015  . COPD (chronic obstructive pulmonary disease) ([place]) 08/12/2015  . Prostate cancer ([place]) 03/16/2014    Past Surgical History:  Procedure Laterality Date  . INGUINAL HERNIA REPAIR Right years ago  . LUMBAR DISC SURGERY  2007   L4-L5  . LYMPHADENECTOMY Bilateral 03/16/2014   Procedure: LYMPHADENECTOMY;  Surgeon: Ardis Hughs, MD;  Location: [facility];  Service: Urology;  Laterality: Bilateral;  . ROBOT ASSISTED LAPAROSCOPIC RADICAL PROSTATECTOMY N/A 03/16/2014   Procedure: ROBOTIC ASSISTED  LAPAROSCOPIC RADICAL PROSTATECTOMY;  Surgeon: Ardis Hughs, MD;  Location: [facility];  Service: Urology;  Laterality: N/A;       Home Medications    Prior to Admission medications   Medication Sig Start Date End Date Taking? Authorizing Provider  albuterol (PROVENTIL HFA;VENTOLIN HFA) 108 (90 BASE) MCG/ACT inhaler Inhale 2 puffs into the lungs every 6 (six) hours as needed for wheezing or shortness of breath (wheezing).     [provider]  aspirin EC 325 MG EC tablet Take 1 tablet (325 mg total) by mouth daily. 09/02/15   Reyne Dumas, MD  cyclobenzaprine (FLEXERIL) 10 MG tablet Take 1 tablet (10 mg total) by mouth 2 (two) times daily as needed for muscle spasms. 05/19/16   Tegeler, Gwenyth Allegra, MD  Fluticasone-Salmeterol (ADVAIR DISKUS) 250-50 MCG/DOSE AEPB Inhale 2 puffs into the lungs 2 (two) times daily. 02/12/16   [provider]  ibuprofen (ADVIL,MOTRIN) 600 MG tablet Take 1 tablet (600 mg total) by mouth every 6 (six) hours as needed. 05/26/16   Carmin Muskrat, MD  lisinopril (PRINIVIL,ZESTRIL) 20 MG tablet Take 1 tablet (20 mg total) by mouth daily. 05/26/16   Carmin Muskrat, MD  metFORMIN (GLUCOPHAGE) 500 MG tablet Take 1 tablet (500 mg total) by mouth 2 (two) times daily with a meal. 09/09/15   Reyne Dumas, MD  naproxen (NAPROSYN) 500 MG tablet Take 1 tablet (500 mg total) by mouth 2 (two) times  daily. 02/11/17   Russo, [place] N, PA-C  oxyCODONE-acetaminophen (PERCOCET) 10-325 MG tablet Take 1 tablet by mouth every 4 (four) hours as needed for pain.    [provider]  oxyCODONE-acetaminophen (PERCOCET/ROXICET) 5-325 MG tablet Take 1 tablet by mouth every 6 (six) hours as needed for severe pain. 05/19/16   Tegeler, Gwenyth Allegra, MD    Family History Family History  Problem Relation Age of Onset  . Colon cancer Sister   . Prostate cancer Father   . Diabetes Mother   . Heart failure Mother   . Cancer Mother        cervical    Social  History Social History  Substance Use Topics  . Smoking status: Current Some Day Smoker    Packs/day: 0.10    Years: 20.00    Last attempt to quit: 01/16/2014  . Smokeless tobacco: Never Used  . Alcohol use Yes     Comment: daly beer 40oz     Allergies   Vicodin [hydrocodone-acetaminophen]   Review of Systems Review of Systems  Musculoskeletal: Positive for arthralgias. Negative for neck pain.  Skin: Negative for wound.  Neurological: Negative for numbness.     Physical Exam Updated Vital Signs BP (!) 165/117 (BP Location: Right Arm)   Pulse 67   Temp 97.8 F (36.6 C) (Oral)   Resp 18   SpO2 97%   Physical Exam  Constitutional: He appears well-developed and well-nourished. No distress.  HENT:  Head: Normocephalic and atraumatic.  Eyes: Conjunctivae are normal.  Neck: Normal range of motion. Neck supple.  Cardiovascular: Normal rate and intact distal pulses.   Pulmonary/Chest: Effort normal.  Musculoskeletal:  Left lateral and posterior shoulder with tenderness. Left trapezius also with tenderness. No deformities, crepitus, or edema. Shoulder was normal active and passive range of motion, however pain with resistive external rotation. No midline spinal tenderness.  Psychiatric: He has a normal mood and affect. His behavior is normal.  Nursing note and vitals reviewed.    ED Treatments / Results  Labs (all labs ordered are listed, but only abnormal results are displayed) Labs Reviewed - No data to display  EKG  EKG Interpretation None       Radiology Dg Shoulder Left  Result Date: 02/11/2017 CLINICAL DATA:  Fall. EXAM: LEFT SHOULDER - 2+ VIEW COMPARISON:  05/25/2013 . FINDINGS: No acute bony or joint abnormality identified. No evidence of fracture or dislocation. No focal bony abnormality. IMPRESSION: No acute abnormality. Electronically Signed   By: Marcello Moores  Register   On: 02/11/2017 08:25    Procedures Procedures (including critical care  time)  Medications Ordered in ED Medications  HYDROcodone-acetaminophen (NORCO/VICODIN) 5-325 MG per tablet 1 tablet (not administered)  acetaminophen (TYLENOL) tablet 500 mg (500 mg Oral Given 02/11/17 1058)     Initial Impression / Assessment and Plan / ED Course  I have reviewed the triage vital signs and the nursing notes.  Pertinent labs & imaging results that were available during my care of the patient were reviewed by me and considered in my medical decision making (see chart for details).     Presenting with persistent left shoulder pain status mechanical post fall 2 weeks ago. Patient X-Ray negative for obvious fracture or dislocation. Possible rotator cuff injury vs contusion, arm placed in sling and orthopedic referral given. Pain managed in ED. Conservative therapy recommended and discussed. Patient will be dc home & is agreeable with above plan.  Patient discussed with and seen by Dr. Regenia Skeeter.  Discussed results, findings, treatment and follow up. Patient advised of return precautions. Patient verbalized understanding and agreed with plan.  Final Clinical Impressions(s) / ED Diagnoses   Final diagnoses:  Acute pain of left shoulder    New Prescriptions Discharge Medication List as of 02/11/2017 10:55 AM    START taking these medications   Details  naproxen (NAPROSYN) 500 MG tablet Take 1 tablet (500 mg total) by mouth 2 (two) times daily., Starting Thu 02/11/2017, Print         Virgina Jock, [place] N, PA-C 02/11/17 1633    Sherwood Gambler, MD 02/12/17 2314

## 2017-02-11 NOTE — Progress Notes (Signed)
Orthopedic Tech Progress Note Patient Details:  Kervin Bones January 13, 1951 [id number]  Ortho Devices Type of Ortho Device: Arm sling Ortho Device/Splint Location: Left Ortho Device/Splint Interventions: Application, Adjustment   Kristopher Oppenheim 02/11/2017, 10:47 AM

## 2017-02-26 ENCOUNTER — Emergency Department (HOSPITAL_COMMUNITY)
Admission: EM | Admit: 2017-02-26 | Discharge: 2017-02-27 | Disposition: A | Discharge status: Home / Self Care | Payer: Medicare Other | Attending: Emergency Medicine | Admitting: Emergency Medicine

## 2017-02-26 ENCOUNTER — Emergency Department (HOSPITAL_COMMUNITY): Payer: Medicare Other

## 2017-02-26 ENCOUNTER — Encounter (HOSPITAL_COMMUNITY): Payer: Self-pay | Admitting: Emergency Medicine

## 2017-02-26 DIAGNOSIS — E119 Type 2 diabetes mellitus without complications: Secondary | ICD-10-CM | POA: Insufficient documentation

## 2017-02-26 DIAGNOSIS — R1084 Generalized abdominal pain: Secondary | ICD-10-CM | POA: Diagnosis not present

## 2017-02-26 DIAGNOSIS — R110 Nausea: Secondary | ICD-10-CM | POA: Insufficient documentation

## 2017-02-26 DIAGNOSIS — R079 Chest pain, unspecified: Secondary | ICD-10-CM | POA: Diagnosis not present

## 2017-02-26 DIAGNOSIS — Z8673 Personal history of transient ischemic attack (TIA), and cerebral infarction without residual deficits: Secondary | ICD-10-CM | POA: Insufficient documentation

## 2017-02-26 DIAGNOSIS — I10 Essential (primary) hypertension: Secondary | ICD-10-CM | POA: Insufficient documentation

## 2017-02-26 DIAGNOSIS — F17210 Nicotine dependence, cigarettes, uncomplicated: Secondary | ICD-10-CM | POA: Insufficient documentation

## 2017-02-26 DIAGNOSIS — R197 Diarrhea, unspecified: Secondary | ICD-10-CM | POA: Diagnosis not present

## 2017-02-26 DIAGNOSIS — Z79899 Other long term (current) drug therapy: Secondary | ICD-10-CM | POA: Diagnosis not present

## 2017-02-26 DIAGNOSIS — Z7982 Long term (current) use of aspirin: Secondary | ICD-10-CM | POA: Diagnosis not present

## 2017-02-26 DIAGNOSIS — J449 Chronic obstructive pulmonary disease, unspecified: Secondary | ICD-10-CM | POA: Diagnosis not present

## 2017-02-26 DIAGNOSIS — R109 Unspecified abdominal pain: Secondary | ICD-10-CM | POA: Diagnosis present

## 2017-02-26 DIAGNOSIS — Z8546 Personal history of malignant neoplasm of prostate: Secondary | ICD-10-CM | POA: Diagnosis not present

## 2017-02-26 DIAGNOSIS — Z7984 Long term (current) use of oral hypoglycemic drugs: Secondary | ICD-10-CM | POA: Insufficient documentation

## 2017-02-26 LAB — BASIC METABOLIC PANEL
Anion gap: 10 (ref 5–15)
BUN: 21 mg/dL — AB (ref 6–20)
CHLORIDE: 102 mmol/L (ref 101–111)
CO2: 24 mmol/L (ref 22–32)
CREATININE: 1.55 mg/dL — AB (ref 0.61–1.24)
Calcium: 9.6 mg/dL (ref 8.9–10.3)
GFR calc Af Amer: 51 mL/min — ABNORMAL LOW (ref >60)
GFR calc non Af Amer: 44 mL/min — ABNORMAL LOW (ref >60)
Glucose, Bld: 189 mg/dL — ABNORMAL HIGH (ref 65–99)
Potassium: 3.9 mmol/L (ref 3.5–5.1)
Sodium: 136 mmol/L (ref 135–145)

## 2017-02-26 LAB — CBC
HCT: 49.3 % (ref 39.0–52.0)
Hemoglobin: 16.9 g/dL (ref 13.0–17.0)
MCH: 30.8 pg (ref 26.0–34.0)
MCHC: 34.3 g/dL (ref 30.0–36.0)
MCV: 90 fL (ref 78.0–100.0)
PLATELETS: 263 10*3/uL (ref 150–400)
RBC: 5.48 MIL/uL (ref 4.22–5.81)
RDW: 15.1 % (ref 11.5–15.5)
WBC: 9.1 10*3/uL (ref 4.0–10.5)

## 2017-02-26 LAB — I-STAT TROPONIN, ED: Troponin i, poc: 0.01 ng/mL (ref 0.00–0.08)

## 2017-02-26 NOTE — ED Triage Notes (Addendum)
Pt reports left chest pain that moves into his left arm and back.  For two days he has had trouble moving his left arm, unable to hold it up.  Reports nausea, weakness and sob at times.    Pt reports he was at the [facility] today for a check up, stated he was "good", did not tell them about he chest pain or arm pain b/c it was not happening then.

## 2017-02-27 ENCOUNTER — Emergency Department (HOSPITAL_COMMUNITY): Payer: Medicare Other

## 2017-02-27 DIAGNOSIS — R1084 Generalized abdominal pain: Secondary | ICD-10-CM | POA: Diagnosis not present

## 2017-02-27 LAB — I-STAT TROPONIN, ED: TROPONIN I, POC: 0.01 ng/mL (ref 0.00–0.08)

## 2017-02-27 LAB — HEPATIC FUNCTION PANEL
ALBUMIN: 4.2 g/dL (ref 3.5–5.0)
ALK PHOS: 96 U/L (ref 38–126)
ALT: 49 U/L (ref 17–63)
AST: 41 U/L (ref 15–41)
BILIRUBIN TOTAL: 0.5 mg/dL (ref 0.3–1.2)
Bilirubin, Direct: <0.1 mg/dL — ABNORMAL LOW (ref 0.1–0.5)
Total Protein: 7.7 g/dL (ref 6.5–8.1)

## 2017-02-27 LAB — LIPASE, BLOOD: Lipase: 44 U/L (ref 11–51)

## 2017-02-27 MED ORDER — ALUM & MAG HYDROXIDE-SIMETH 200-200-20 MG/5ML PO SUSP
15.0000 mL | Freq: Once | ORAL | Status: AC
  Administered 2017-02-27: 15 mL via ORAL
  Filled 2017-02-27: qty 30

## 2017-02-27 MED ORDER — ONDANSETRON 4 MG PO TBDP: 4.0000 mg | ORAL_TABLET | Freq: Three times a day (TID) | ORAL | 0 refills | Status: DC | PRN

## 2017-02-27 MED ORDER — IOPAMIDOL (ISOVUE-300) INJECTION 61%
100.0000 mL | Freq: Once | INTRAVENOUS | Status: AC | PRN
  Administered 2017-02-27: 100 mL via INTRAVENOUS

## 2017-02-27 NOTE — ED Provider Notes (Signed)
[facility] EMERGENCY DEPARTMENT Provider Note   CSN: [id number] Arrival date & time: 02/26/17  2110     History   Chief Complaint Chief Complaint  Patient presents with  . Chest Pain    HPI Ryan Hopkins is a 66 y.o. male.  66 yo M with a chief complaint of abdominal pain.  This been going on for the past couple days.  He felt generally unwell and had some stomach discomfort.  Describes it as a burning sensation.  Diffusely about the abdomen.  Has had some nausea and diarrhea with this.  He started having some chest pain today as well.  Worse with movement and palpation twisting and deep breaths.  He denies trauma denies coughing denies fevers or chills.  He denies lower extremity edema.  Has a history of hypertension hyperlipidemia diabetes and a family history of MI.  Denies PE or DVT.  Has remote history of prostate cancer.   The history is provided by the patient.  Chest Pain   This is a new problem. The current episode started more than 2 days ago. The problem occurs constantly. The problem has been gradually worsening. The pain is associated with movement and breathing. The pain is present in the lateral region. The pain is at a severity of 7/10. The pain is moderate. The quality of the pain is described as brief. The pain does not radiate. Duration of episode(s) is 2 days. Associated symptoms include abdominal pain, nausea and vomiting. Pertinent negatives include no fever, no headaches, no palpitations and no shortness of breath. He has tried nothing for the symptoms. The treatment provided no relief.    Past Medical History:  Diagnosis Date  . Anal fissure   . Back pain, chronic   . COPD (chronic obstructive pulmonary disease) ([place])   . Degenerative arthritis of spine   . Diabetes mellitus without complication ([place])   . Diverticulitis   . Emphysema   . Enlarged prostate   . Heart murmur   . Hypertension   . Nocturia   . Prostate cancer ([place]) 11/2013   . TIA (transient ischemic attack)     Patient Active Problem List   Diagnosis Date Noted  . Acute chest pain   . Chest pain 08/30/2015  . Facial droop 08/30/2015  . AKI (acute kidney injury) ([place]) 08/30/2015  . DM (diabetes mellitus) ([place]) 08/30/2015  . OSA (obstructive sleep apnea) 08/12/2015  . COPD (chronic obstructive pulmonary disease) ([place]) 08/12/2015  . Prostate cancer ([place]) 03/16/2014    Past Surgical History:  Procedure Laterality Date  . INGUINAL HERNIA REPAIR Right years ago  . LUMBAR DISC SURGERY  2007   L4-L5  . LYMPHADENECTOMY Bilateral 03/16/2014   Procedure: LYMPHADENECTOMY;  Surgeon: Ardis Hughs, MD;  Location: [facility];  Service: Urology;  Laterality: Bilateral;  . ROBOT ASSISTED LAPAROSCOPIC RADICAL PROSTATECTOMY N/A 03/16/2014   Procedure: ROBOTIC ASSISTED LAPAROSCOPIC RADICAL PROSTATECTOMY;  Surgeon: Ardis Hughs, MD;  Location: [facility];  Service: Urology;  Laterality: N/A;       Home Medications    Prior to Admission medications   Medication Sig Start Date End Date Taking? Authorizing Provider  albuterol (PROVENTIL HFA;VENTOLIN HFA) 108 (90 BASE) MCG/ACT inhaler Inhale 2 puffs into the lungs every 6 (six) hours as needed for wheezing or shortness of breath (wheezing).     [provider]  aspirin EC 325 MG EC tablet Take 1 tablet (325 mg total) by mouth daily. 09/02/15   Abrol,  Ascencion Dike, MD  cyclobenzaprine (FLEXERIL) 10 MG tablet Take 1 tablet (10 mg total) by mouth 2 (two) times daily as needed for muscle spasms. 05/19/16   Tegeler, Gwenyth Allegra, MD  Fluticasone-Salmeterol (ADVAIR DISKUS) 250-50 MCG/DOSE AEPB Inhale 2 puffs into the lungs 2 (two) times daily. 02/12/16   [provider]  ibuprofen (ADVIL,MOTRIN) 600 MG tablet Take 1 tablet (600 mg total) by mouth every 6 (six) hours as needed. 05/26/16   Carmin Muskrat, MD  lisinopril (PRINIVIL,ZESTRIL) 20 MG tablet Take 1 tablet (20 mg total) by mouth daily. 05/26/16   Carmin Muskrat, MD  metFORMIN (GLUCOPHAGE) 500 MG tablet Take 1 tablet (500 mg total) by mouth 2 (two) times daily with a meal. 09/09/15   Reyne Dumas, MD  naproxen (NAPROSYN) 500 MG tablet Take 1 tablet (500 mg total) by mouth 2 (two) times daily. 02/11/17   Robinson, [place] N, PA-C  ondansetron (ZOFRAN ODT) 4 MG disintegrating tablet Take 1 tablet (4 mg total) by mouth every 8 (eight) hours as needed for nausea or vomiting. 02/27/17   Deno Etienne, DO  oxyCODONE-acetaminophen (PERCOCET) 10-325 MG tablet Take 1 tablet by mouth every 4 (four) hours as needed for pain.    [provider]  oxyCODONE-acetaminophen (PERCOCET/ROXICET) 5-325 MG tablet Take 1 tablet by mouth every 6 (six) hours as needed for severe pain. 05/19/16   Tegeler, Gwenyth Allegra, MD    Family History Family History  Problem Relation Age of Onset  . Colon cancer Sister   . Prostate cancer Father   . Diabetes Mother   . Heart failure Mother   . Cancer Mother        cervical    Social History Social History  Substance Use Topics  . Smoking status: Current Some Day Smoker    Packs/day: 0.10    Years: 20.00    Last attempt to quit: 01/16/2014  . Smokeless tobacco: Never Used  . Alcohol use Yes     Comment: daly beer 40oz     Allergies   Vicodin [hydrocodone-acetaminophen]   Review of Systems Review of Systems  Constitutional: Negative for chills and fever.  HENT: Negative for congestion and facial swelling.   Eyes: Negative for discharge and visual disturbance.  Respiratory: Negative for shortness of breath.   Cardiovascular: Positive for chest pain. Negative for palpitations.  Gastrointestinal: Positive for abdominal pain, nausea and vomiting. Negative for diarrhea.  Musculoskeletal: Negative for arthralgias and myalgias.  Skin: Negative for color change and rash.  Neurological: Negative for tremors, syncope and headaches.  Psychiatric/Behavioral: Negative for confusion and dysphoric mood.     Physical  Exam Updated Vital Signs BP (!) 126/92   Pulse 65   Temp 97.7 F (36.5 C) (Oral)   Resp 17   Ht 5\' 8"  (1.727 m)   Wt 95.3 kg (210 lb)   SpO2 98%   BMI 31.93 kg/m   Physical Exam  Constitutional: He is oriented to person, place, and time. He appears well-developed and well-nourished.  HENT:  Head: Normocephalic and atraumatic.  Eyes: Pupils are equal, round, and reactive to light. EOM are normal.  Neck: Normal range of motion. Neck supple. No JVD present.  Cardiovascular: Normal rate and regular rhythm.  Exam reveals no gallop and no friction rub.   No murmur heard. Pulmonary/Chest: No respiratory distress. He has no wheezes. He exhibits tenderness.  Abdominal: He exhibits no distension and no mass. There is tenderness. There is no rebound and no guarding.  Musculoskeletal: Normal  range of motion.  Neurological: He is alert and oriented to person, place, and time.  Skin: No rash noted. No pallor.  Psychiatric: He has a normal mood and affect. His behavior is normal.  Nursing note and vitals reviewed.    ED Treatments / Results  Labs (all labs ordered are listed, but only abnormal results are displayed) Labs Reviewed  BASIC METABOLIC PANEL - Abnormal; Notable for the following:       Result Value   Glucose, Bld 189 (*)    BUN 21 (*)    Creatinine, Ser 1.55 (*)    GFR calc non Af Amer 44 (*)    GFR calc Af Amer 51 (*)    All other components within normal limits  HEPATIC FUNCTION PANEL - Abnormal; Notable for the following:    Bilirubin, Direct <0.1 (*)    All other components within normal limits  CBC  LIPASE, BLOOD  I-STAT TROPONIN, ED  I-STAT TROPONIN, ED    EKG    ED ECG REPORT   Date: 02/27/2017  Rate: 106  Rhythm: normal sinus rhythm  QRS Axis: normal  Intervals: PR prolonged  ST/T Wave abnormalities: nonspecific ST changes  Conduction Disutrbances:first-degree A-V block   Narrative Interpretation:   Old EKG Reviewed: unchanged  I have personally  reviewed the EKG tracing and agree with the computerized printout as noted.   Radiology Dg Chest 2 View  Result Date: 02/26/2017 CLINICAL DATA:  66 year old male with chest pain. EXAM: CHEST  2 VIEW COMPARISON:  Chest CT dated 08/30/2015 FINDINGS: The lungs are clear. There is no pleural effusion or pneumothorax. The cardiac silhouette is within normal limits. No acute osseous pathology. IMPRESSION: No active cardiopulmonary disease. Electronically Signed   By: Anner Crete M.D.   On: 02/26/2017 22:07   Ct Abdomen Pelvis W Contrast  Result Date: 02/27/2017 CLINICAL DATA:  66 year old male with abdominal pain. EXAM: CT ABDOMEN AND PELVIS WITH CONTRAST TECHNIQUE: Multidetector CT imaging of the abdomen and pelvis was performed using the standard protocol following bolus administration of intravenous contrast. CONTRAST:  19mL ISOVUE-300 IOPAMIDOL (ISOVUE-300) INJECTION 61% COMPARISON:  CT dated 08/30/2015 FINDINGS: Lower chest: The visualized lung bases are clear. No intra-abdominal free air or free fluid. Hepatobiliary: No focal liver abnormality is seen. No gallstones, gallbladder wall thickening, or biliary dilatation. Pancreas: Unremarkable. No pancreatic ductal dilatation or surrounding inflammatory changes. Spleen: Normal in size without focal abnormality. Adrenals/Urinary Tract: There is an 8 mm (measures approximately 18 mm on coronal images) indeterminate nodule in the lateral limb of the left adrenal gland. The right adrenal gland is unremarkable. The kidneys, visualized ureters, and urinary bladder appear unremarkable. Stomach/Bowel: There is no evidence of bowel obstruction or active inflammation. The appendix is normal. Vascular/Lymphatic: There is mild aortoiliac atherosclerotic disease. The IVC is grossly unremarkable. No portal venous gas identified. There is no adenopathy. Reproductive: Prostatectomy. Other: None Musculoskeletal: No acute or significant osseous findings. IMPRESSION: 1.  No acute intra-abdominal or pelvic pathology. 2. **An incidental finding of potential clinical significance has been found. Indeterminate left adrenal nodule. Further characterization with nonemergent MRI without and with contrast recommended.** 3. Electronically Signed   By: Anner Crete M.D.   On: 02/27/2017 02:11    Procedures Procedures (including critical care time)  Medications Ordered in ED Medications  alum & mag hydroxide-simeth (MAALOX/MYLANTA) 200-200-20 MG/5ML suspension 15 mL (15 mLs Oral Given 02/27/17 0058)  iopamidol (ISOVUE-300) 61 % injection 100 mL (100 mLs Intravenous Contrast Given 02/27/17 0143)  Initial Impression / Assessment and Plan / ED Course  I have reviewed the triage vital signs and the nursing notes.  Pertinent labs & imaging results that were available during my care of the patient were reviewed by me and considered in my medical decision making (see chart for details).     66 yo M with a chief complaint of abdominal pain.  He also has felt generally unwell for the past week.  Diffuse abdominal tenderness on my exam.  Due to his age will obtain a CT scan.  Chest pain is completely atypical of ACS.  Patient has every risk factor possible for heart disease. Will obtain delta.   Delta negative, ct negative. D/c home.  4:33 AM:  I have discussed the diagnosis/risks/treatment options with the patient and believe the pt to be eligible for discharge home to follow-up with PCP. We also discussed returning to the ED immediately if new or worsening sx occur. We discussed the sx which are most concerning (e.g., sudden worsening pain, fever, inability to tolerate by mouth) that necessitate immediate return. Medications administered to the patient during their visit and any new prescriptions provided to the patient are listed below.  Medications given during this visit Medications  alum & mag hydroxide-simeth (MAALOX/MYLANTA) 200-200-20 MG/5ML suspension 15 mL  (15 mLs Oral Given 02/27/17 0058)  iopamidol (ISOVUE-300) 61 % injection 100 mL (100 mLs Intravenous Contrast Given 02/27/17 0143)     The patient appears reasonably screen and/or stabilized for discharge and I doubt any other medical condition or other [facility] requiring further screening, evaluation, or treatment in the ED at this time prior to discharge.    Final Clinical Impressions(s) / ED Diagnoses   Final diagnoses:  Generalized abdominal pain    New Prescriptions Discharge Medication List as of 02/27/2017  2:27 AM    START taking these medications   Details  ondansetron (ZOFRAN ODT) 4 MG disintegrating tablet Take 1 tablet (4 mg total) by mouth every 8 (eight) hours as needed for nausea or vomiting., Starting Sat 02/27/2017, Print         Rowena, Linna Hoff, DO 02/27/17 [id number]

## 2017-02-27 NOTE — Discharge Instructions (Signed)
Try zantac 150mg twice a day.  ° ° °

## 2017-05-13 ENCOUNTER — Ambulatory Visit: Payer: Medicare Other | Admitting: Podiatry

## 2017-05-20 ENCOUNTER — Ambulatory Visit: Payer: Medicare Other | Admitting: Podiatry

## 2017-07-30 ENCOUNTER — Ambulatory Visit: Payer: Medicare Other | Admitting: Podiatry

## 2017-08-03 ENCOUNTER — Ambulatory Visit: Payer: Self-pay | Admitting: Gastroenterology

## 2017-08-27 ENCOUNTER — Ambulatory Visit: Payer: Medicare Other | Admitting: Podiatry

## 2017-11-17 ENCOUNTER — Emergency Department (HOSPITAL_COMMUNITY): Payer: Medicare Other

## 2017-11-17 ENCOUNTER — Encounter (HOSPITAL_COMMUNITY): Payer: Self-pay

## 2017-11-17 ENCOUNTER — Other Ambulatory Visit: Payer: Self-pay

## 2017-11-17 ENCOUNTER — Emergency Department (HOSPITAL_COMMUNITY)
Admission: EM | Admit: 2017-11-17 | Discharge: 2017-11-17 | Disposition: A | Discharge status: Home / Self Care | Payer: Medicare Other | Attending: Emergency Medicine | Admitting: Emergency Medicine

## 2017-11-17 DIAGNOSIS — I10 Essential (primary) hypertension: Secondary | ICD-10-CM | POA: Insufficient documentation

## 2017-11-17 DIAGNOSIS — F17210 Nicotine dependence, cigarettes, uncomplicated: Secondary | ICD-10-CM | POA: Diagnosis not present

## 2017-11-17 DIAGNOSIS — Z79899 Other long term (current) drug therapy: Secondary | ICD-10-CM | POA: Insufficient documentation

## 2017-11-17 DIAGNOSIS — J449 Chronic obstructive pulmonary disease, unspecified: Secondary | ICD-10-CM | POA: Diagnosis not present

## 2017-11-17 DIAGNOSIS — Z8546 Personal history of malignant neoplasm of prostate: Secondary | ICD-10-CM | POA: Insufficient documentation

## 2017-11-17 DIAGNOSIS — R079 Chest pain, unspecified: Secondary | ICD-10-CM | POA: Diagnosis not present

## 2017-11-17 DIAGNOSIS — Z7982 Long term (current) use of aspirin: Secondary | ICD-10-CM | POA: Diagnosis not present

## 2017-11-17 DIAGNOSIS — R1010 Upper abdominal pain, unspecified: Secondary | ICD-10-CM | POA: Insufficient documentation

## 2017-11-17 DIAGNOSIS — Z8673 Personal history of transient ischemic attack (TIA), and cerebral infarction without residual deficits: Secondary | ICD-10-CM | POA: Diagnosis not present

## 2017-11-17 DIAGNOSIS — E119 Type 2 diabetes mellitus without complications: Secondary | ICD-10-CM | POA: Insufficient documentation

## 2017-11-17 LAB — CBC
HEMATOCRIT: 50.5 % (ref 39.0–52.0)
Hemoglobin: 17 g/dL (ref 13.0–17.0)
MCH: 30.6 pg (ref 26.0–34.0)
MCHC: 33.7 g/dL (ref 30.0–36.0)
MCV: 91 fL (ref 78.0–100.0)
PLATELETS: 214 10*3/uL (ref 150–400)
RBC: 5.55 MIL/uL (ref 4.22–5.81)
RDW: 13.9 % (ref 11.5–15.5)
WBC: 7.9 10*3/uL (ref 4.0–10.5)

## 2017-11-17 LAB — HEPATIC FUNCTION PANEL
ALT: 26 U/L (ref 0–44)
AST: 28 U/L (ref 15–41)
Albumin: 3.9 g/dL (ref 3.5–5.0)
Alkaline Phosphatase: 87 U/L (ref 38–126)
BILIRUBIN INDIRECT: 0.8 mg/dL (ref 0.3–0.9)
BILIRUBIN TOTAL: 1.1 mg/dL (ref 0.3–1.2)
Bilirubin, Direct: 0.3 mg/dL — ABNORMAL HIGH (ref 0.0–0.2)
TOTAL PROTEIN: 7.7 g/dL (ref 6.5–8.1)

## 2017-11-17 LAB — I-STAT TROPONIN, ED: TROPONIN I, POC: 0.01 ng/mL (ref 0.00–0.08)

## 2017-11-17 LAB — URINALYSIS, ROUTINE W REFLEX MICROSCOPIC
BILIRUBIN URINE: NEGATIVE
GLUCOSE, UA: NEGATIVE mg/dL
HGB URINE DIPSTICK: NEGATIVE
Ketones, ur: NEGATIVE mg/dL
Leukocytes, UA: NEGATIVE
Nitrite: NEGATIVE
PH: 5 (ref 5.0–8.0)
Protein, ur: NEGATIVE mg/dL
SPECIFIC GRAVITY, URINE: 1.004 — AB (ref 1.005–1.030)

## 2017-11-17 LAB — BASIC METABOLIC PANEL
Anion gap: 10 (ref 5–15)
BUN: 12 mg/dL (ref 8–23)
CHLORIDE: 103 mmol/L (ref 98–111)
CO2: 24 mmol/L (ref 22–32)
CREATININE: 1.28 mg/dL — AB (ref 0.61–1.24)
Calcium: 9.4 mg/dL (ref 8.9–10.3)
GFR calc Af Amer: >60 mL/min (ref >60)
GFR calc non Af Amer: 55 mL/min — ABNORMAL LOW (ref >60)
GLUCOSE: 99 mg/dL (ref 70–99)
POTASSIUM: 3.5 mmol/L (ref 3.5–5.1)
SODIUM: 137 mmol/L (ref 135–145)

## 2017-11-17 LAB — MAGNESIUM: Magnesium: 2 mg/dL (ref 1.7–2.4)

## 2017-11-17 LAB — TROPONIN I: Troponin I: <0.03 ng/mL (ref <0.03)

## 2017-11-17 LAB — LIPASE, BLOOD: Lipase: 47 U/L (ref 11–51)

## 2017-11-17 MED ORDER — SUCRALFATE 1 G PO TABS: 1.0000 g | ORAL_TABLET | Freq: Three times a day (TID) | ORAL | 0 refills | Status: DC

## 2017-11-17 MED ORDER — OMEPRAZOLE 20 MG PO CPDR: 20.0000 mg | DELAYED_RELEASE_CAPSULE | Freq: Every day | ORAL | 0 refills | Status: DC

## 2017-11-17 MED ORDER — GI COCKTAIL ~~LOC~~
30.0000 mL | Freq: Once | ORAL | Status: AC
  Administered 2017-11-17: 30 mL via ORAL
  Filled 2017-11-17: qty 30

## 2017-11-17 MED ORDER — ONDANSETRON 4 MG PO TBDP: 4.0000 mg | ORAL_TABLET | Freq: Three times a day (TID) | ORAL | 0 refills | Status: DC | PRN

## 2017-11-17 MED ORDER — ONDANSETRON HCL 4 MG/2ML IJ SOLN
4.0000 mg | Freq: Once | INTRAMUSCULAR | Status: AC
  Administered 2017-11-17: 4 mg via INTRAVENOUS
  Filled 2017-11-17: qty 2

## 2017-11-17 MED ORDER — SODIUM CHLORIDE 0.9 % IV BOLUS
500.0000 mL | Freq: Once | INTRAVENOUS | Status: AC
  Administered 2017-11-17: 500 mL via INTRAVENOUS

## 2017-11-17 MED ORDER — IOHEXOL 300 MG/ML  SOLN
100.0000 mL | Freq: Once | INTRAMUSCULAR | Status: AC | PRN
  Administered 2017-11-17: 100 mL via INTRAVENOUS

## 2017-11-17 NOTE — Discharge Instructions (Signed)
Please read and follow all provided instructions.  Your diagnoses today include:  1. Upper abdominal pain     Tests performed today include:  Blood counts and electrolytes  Blood tests to check liver and kidney function  Blood tests to check pancreas function  Urine test to look for infection  CT of your abdomen and pelvis -does not show any severe problems  EKG and blood test for the heart -no sign of heart attack  Vital signs. See below for your results today.   Medications prescribed:   Zofran (ondansetron) - for nausea and vomiting   Carafate - for stomach upset and to protect your stomach   Omeprazole (Prilosec) - stomach acid reducer  This medication can be found over-the-counter  Take any prescribed medications only as directed.  Home care instructions:   Follow any educational materials contained in this packet.  Follow-up instructions: Please follow-up with your primary care provider in the next 3 days for further evaluation of your symptoms.    Return instructions:  SEEK IMMEDIATE MEDICAL ATTENTION IF:  The pain does not go away or becomes severe   If you develop chest pain, especially if it does not go away after a few minutes  A temperature above 101F develops   Repeated vomiting occurs (multiple episodes)   The pain becomes localized to portions of the abdomen. The right side could possibly be appendicitis. In an adult, the left lower portion of the abdomen could be colitis or diverticulitis.   Blood is being passed in stools or vomit (bright red or black tarry stools)   You develop chest pain, difficulty breathing, dizziness or fainting, or become confused, poorly responsive, or inconsolable (young children)  If you have any other emergent concerns regarding your health  Additional Information: Abdominal (belly) pain can be caused by many things. Your caregiver performed an examination and possibly ordered blood/urine tests and imaging (CT  scan, x-rays, ultrasound). Many cases can be observed and treated at home after initial evaluation in the emergency department. Even though you are being discharged home, abdominal pain can be unpredictable. Therefore, you need a repeated exam if your pain does not resolve, returns, or worsens. Most patients with abdominal pain don't have to be admitted to the hospital or have surgery, but serious problems like appendicitis and gallbladder attacks can start out as nonspecific pain. Many abdominal conditions cannot be diagnosed in one visit, so follow-up evaluations are very important.  Your vital signs today were: BP (!) 129/94    Pulse 69    Temp 97.9 F (36.6 C) (Oral)    Resp 16    Ht 5\' 8"  (1.727 m)    Wt 111.1 kg (245 lb)    SpO2 94%    BMI 37.25 kg/m  If your blood pressure (bp) was elevated above 135/85 this visit, please have this repeated by your doctor within one month. --------------

## 2017-11-17 NOTE — ED Provider Notes (Signed)
[facility] EMERGENCY DEPARTMENT Provider Note   CSN: [id number] Arrival date & time: 11/17/17  1359     History   Chief Complaint Chief Complaint  Patient presents with  . Chest Pain  . Abdominal Pain    HPI Ryan Hopkins is a 67 y.o. male.  Patient with history of prostate cancer, status post surgery, chronic urinary incontinence, COPD, diabetes, history of right inguinal hernia repair --presents to the emergency department today with complaint of upper abdominal pain described as burning.  Patient had some right sided chest pain that has since resolved.  He denies fevers, nausea, vomiting, diarrhea, constipation.  He denies any pain or blood in his urine.  No treatments prior to arrival.  Patient states he has not had pain like this in the past.  He reports a chronic cough over the past several months.  The onset of this condition was acute. The course is constant. Aggravating factors: none. Alleviating factors: none.       Past Medical History:  Diagnosis Date  . Anal fissure   . Back pain, chronic   . COPD (chronic obstructive pulmonary disease) ([place])   . Degenerative arthritis of spine   . Diabetes mellitus without complication ([place])   . Diverticulitis   . Emphysema   . Enlarged prostate   . Heart murmur   . Hypertension   . Nocturia   . Prostate cancer ([place]) 11/2013  . TIA (transient ischemic attack)     Patient Active Problem List   Diagnosis Date Noted  . Acute chest pain   . Chest pain 08/30/2015  . Facial droop 08/30/2015  . AKI (acute kidney injury) ([place]) 08/30/2015  . DM (diabetes mellitus) ([place]) 08/30/2015  . OSA (obstructive sleep apnea) 08/12/2015  . COPD (chronic obstructive pulmonary disease) ([place]) 08/12/2015  . Prostate cancer ([place]) 03/16/2014    Past Surgical History:  Procedure Laterality Date  . INGUINAL HERNIA REPAIR Right years ago  . LUMBAR DISC SURGERY  2007   L4-L5  . LYMPHADENECTOMY Bilateral 03/16/2014   Procedure: LYMPHADENECTOMY;  Surgeon: Ardis Hughs, MD;  Location: [facility];  Service: Urology;  Laterality: Bilateral;  . ROBOT ASSISTED LAPAROSCOPIC RADICAL PROSTATECTOMY N/A 03/16/2014   Procedure: ROBOTIC ASSISTED LAPAROSCOPIC RADICAL PROSTATECTOMY;  Surgeon: Ardis Hughs, MD;  Location: [facility];  Service: Urology;  Laterality: N/A;        Home Medications    Prior to Admission medications   Medication Sig Start Date End Date Taking? Authorizing Provider  albuterol (PROVENTIL HFA;VENTOLIN HFA) 108 (90 BASE) MCG/ACT inhaler Inhale 2 puffs into the lungs every 6 (six) hours as needed for wheezing or shortness of breath (wheezing).     [provider]  aspirin EC 325 MG EC tablet Take 1 tablet (325 mg total) by mouth daily. 09/02/15   Reyne Dumas, MD  cyclobenzaprine (FLEXERIL) 10 MG tablet Take 1 tablet (10 mg total) by mouth 2 (two) times daily as needed for muscle spasms. 05/19/16   Tegeler, Gwenyth Allegra, MD  Fluticasone-Salmeterol (ADVAIR DISKUS) 250-50 MCG/DOSE AEPB Inhale 2 puffs into the lungs 2 (two) times daily. 02/12/16   [provider]  ibuprofen (ADVIL,MOTRIN) 600 MG tablet Take 1 tablet (600 mg total) by mouth every 6 (six) hours as needed. 05/26/16   Carmin Muskrat, MD  lisinopril (PRINIVIL,ZESTRIL) 20 MG tablet Take 1 tablet (20 mg total) by mouth daily. 05/26/16   Carmin Muskrat, MD  metFORMIN (GLUCOPHAGE) 500 MG tablet Take 1 tablet (500 mg  total) by mouth 2 (two) times daily with a meal. 09/09/15   Reyne Dumas, MD  naproxen (NAPROSYN) 500 MG tablet Take 1 tablet (500 mg total) by mouth 2 (two) times daily. 02/11/17   Robinson, [place] N, PA-C  ondansetron (ZOFRAN ODT) 4 MG disintegrating tablet Take 1 tablet (4 mg total) by mouth every 8 (eight) hours as needed for nausea or vomiting. 02/27/17   Deno Etienne, DO  oxyCODONE-acetaminophen (PERCOCET) 10-325 MG tablet Take 1 tablet by mouth every 4 (four) hours as needed for pain.    [provider]  oxyCODONE-acetaminophen (PERCOCET/ROXICET) 5-325 MG tablet Take 1 tablet by mouth every 6 (six) hours as needed for severe pain. 05/19/16   Tegeler, Gwenyth Allegra, MD    Family History Family History  Problem Relation Age of Onset  . Colon cancer Sister   . Prostate cancer Father   . Diabetes Mother   . Heart failure Mother   . Cancer Mother        cervical    Social History Social History   Tobacco Use  . Smoking status: Current Some Day Smoker    Packs/day: 0.10    Years: 20.00    Pack years: 2.00    Last attempt to quit: 01/16/2014    Years since quitting: 3.8  . Smokeless tobacco: Never Used  Substance Use Topics  . Alcohol use: Yes    Comment: daly beer 40oz  . Drug use: Yes    Types: Marijuana    Comment: Quit marijuana years ago     Allergies   Vicodin [hydrocodone-acetaminophen]   Review of Systems Review of Systems  Constitutional: Negative for fever.  HENT: Negative for rhinorrhea and sore throat.   Eyes: Negative for redness.  Respiratory: Negative for cough and shortness of breath.   Cardiovascular: Positive for chest pain. Negative for palpitations.  Gastrointestinal: Positive for abdominal pain and nausea. Negative for diarrhea and vomiting.  Genitourinary: Negative for dysuria.  Musculoskeletal: Negative for myalgias.  Skin: Negative for rash.  Neurological: Negative for headaches.     Physical Exam Updated Vital Signs BP (!) 141/101 (BP Location: Left Arm)   Pulse 75   Temp 97.9 F (36.6 C) (Oral)   Resp 16   Ht 5\' 8"  (1.727 m)   Wt 111.1 kg (245 lb)   SpO2 100%   BMI 37.25 kg/m   Physical Exam  Constitutional: He appears well-developed and well-nourished.  HENT:  Head: Normocephalic and atraumatic.  Eyes: Conjunctivae are normal. Right eye exhibits no discharge. Left eye exhibits no discharge.  Neck: Normal range of motion. Neck supple.  Cardiovascular: Normal rate, regular rhythm and normal heart sounds.    Pulmonary/Chest: Effort normal and breath sounds normal. He has no wheezes. He has no rhonchi. He has no rales.  Abdominal: Soft. Bowel sounds are normal. There is tenderness (mild) in the right upper quadrant, epigastric area, periumbilical area and left upper quadrant. There is no rebound and no guarding.  Neurological: He is alert.  Skin: Skin is warm and dry.  Psychiatric: He has a normal mood and affect.  Nursing note and vitals reviewed.    ED Treatments / Results  Labs (all labs ordered are listed, but only abnormal results are displayed) Labs Reviewed  BASIC METABOLIC PANEL - Abnormal; Notable for the following components:      Result Value   Creatinine, Ser 1.28 (*)    GFR calc non Af Amer 55 (*)    All other components  within normal limits  URINALYSIS, ROUTINE W REFLEX MICROSCOPIC - Abnormal; Notable for the following components:   Color, Urine STRAW (*)    Specific Gravity, Urine 1.004 (*)    All other components within normal limits  HEPATIC FUNCTION PANEL - Abnormal; Notable for the following components:   Bilirubin, Direct 0.3 (*)    All other components within normal limits  URINE CULTURE  CBC  LIPASE, BLOOD  TROPONIN I  MAGNESIUM  I-STAT TROPONIN, ED    EKG EKG Interpretation  Date/Time:  Wednesday November 17 2017 14:07:24 EDT Ventricular Rate:  83 PR Interval:    QRS Duration: 86 QT Interval:  390 QTC Calculation: 458 R Axis:   53 Text Interpretation:  possible 3rd degree with nodal escape Abnormal ECG no acute st/ts new compared with 10/18 when had significant 1st AVB Confirmed by Aletta Edouard [phone]) on 11/17/2017 5:13:48 PM   EKG Interpretation  Date/Time:  Wednesday November 17 2017 17:23:18 EDT Ventricular Rate:  74 PR Interval:    QRS Duration: 94 QT Interval:  459 QTC Calculation: 510 R Axis:   74 Text Interpretation:  Sinus rhythm Prolonged PR interval Minimal ST depression, inferior leads Prolonged QT interval Confirmed by Aletta Edouard  [id number]) on 11/17/2017 5:41:32 PM       Radiology Dg Chest 2 View  Result Date: 11/17/2017 CLINICAL DATA:  Chest pain EXAM: CHEST - 2 VIEW COMPARISON:  02/26/2017 FINDINGS: The heart size and mediastinal contours are within normal limits. Both lungs are clear. The visualized skeletal structures are unremarkable. IMPRESSION: No active cardiopulmonary disease. Electronically Signed   By: Kathreen Devoid   On: 11/17/2017 15:18    Procedures Procedures (including critical care time)  Medications Ordered in ED Medications  ondansetron (ZOFRAN) injection 4 mg (4 mg Intravenous Given 11/17/17 1928)  sodium chloride 0.9 % bolus 500 mL (0 mLs Intravenous Stopped 11/17/17 2053)  iohexol (OMNIPAQUE) 300 MG/ML solution 100 mL (100 mLs Intravenous Contrast Given 11/17/17 1827)  gi cocktail (Maalox,Lidocaine,Donnatal) (30 mLs Oral Given 11/17/17 1928)     Initial Impression / Assessment and Plan / ED Course  I have reviewed the triage vital signs and the nursing notes.  Pertinent labs & imaging results that were available during my care of the patient were reviewed by me and considered in my medical decision making (see chart for details).     Patient seen and examined. EKG reviewed with Dr. Melina Copa. Will repeat. CT ordered for eval of abd pain. Labs normal to this point. Repeat trop ordered.    Vital signs reviewed and are as follows: BP (!) 141/101 (BP Location: Left Arm)   Pulse 75   Temp 97.9 F (36.6 C) (Oral)   Resp 16   Ht 5\' 8"  (1.727 m)   Wt 111.1 kg (245 lb)   SpO2 100%   BMI 37.25 kg/m   6:00 PM Repeat EKG looks much more like previous with 1st degree HB. 2nd troponin is normal. CT pending.   7:19 PM patient states he is feeling a little bit better.  I updated the patient on his CT results.  I have ordered a GI cocktail.  Will reassess.  Anticipate discharge home with Carafate, PPI, PCP follow-up.  9:28 PM patient requesting something to eat, eating without difficulty.  We will  discharge home. Encouraged PCP f/u for recheck in 3 days.   The patient was urged to return to the Emergency Department immediately with worsening of current symptoms, worsening abdominal pain, persistent vomiting,  blood noted in stools, fever, or any other concerns. The patient verbalized understanding.   Patient was counseled to return with severe chest pain, especially if the pain is crushing or pressure-like and spreads to the arms, back, neck, or jaw, or if they have sweating, nausea, or shortness of breath with the pain. Encouraged to return with severe lightheadedness or if he passes out.  They were encouraged to call 911 with these symptoms.   The patient verbalized understanding and agreed.    Final Clinical Impressions(s) / ED Diagnoses   Final diagnoses:  Upper abdominal pain   Patient with abdominal pain. Vitals are stable, no fever. Labs reassuring. Imaging negative. No signs of dehydration, patient is tolerating PO's. EKG is abnormal. Patient has had 1st degree HB in past with very prolonged QT.  1st EKG questionable, 2nd similar to previous. Pt without bradycardia or near syncope sx to suggest transient 3rd degree block.  Lungs are clear and no signs suggestive of PNA. Low concern for appendicitis, cholecystitis, pancreatitis, ruptured viscus, UTI, kidney stone, aortic dissection, aortic aneurysm or other emergent abdominal etiology. Supportive therapy indicated with return if symptoms worsen.    ED Discharge Orders        Ordered    ondansetron (ZOFRAN ODT) 4 MG disintegrating tablet  Every 8 hours PRN     11/17/17 2110    sucralfate (CARAFATE) 1 g tablet  3 times daily with meals & bedtime     11/17/17 2110    omeprazole (PRILOSEC) 20 MG capsule  Daily     11/17/17 2110       Carlisle Cater, PA-C 11/17/17 2213    Hayden Rasmussen, MD 11/19/17 [phone]

## 2017-11-17 NOTE — ED Triage Notes (Signed)
Per [facility], pt endorses acute onset of generalized abd pain, has had on going intermittient CP x 1 week. Denies n/v, but has had constant urine incontinence and hx of prostate cancer. VS 156/116, HR 84, RR 20, spo2 99%, CBG 112.

## 2017-11-17 NOTE — ED Notes (Signed)
Results reviewed.  No changes in acuity at this time 

## 2017-11-17 NOTE — ED Notes (Signed)
Patient up to desk stating that "you told me 20 mintues ago I was going to have a room" when this RN made him aware they were working on cleaning a room for him at this time.  This RN asked patient to wait to speak to Charge RN, and Agricultural consultant states she is on the way to lobby now to pick patient up for rooming.  This RN informed patient, patient agreed to wait

## 2017-11-17 NOTE — Congregational Nurse Program (Signed)
Congregational Nurse Program Note  Date of Encounter: 11/17/2017  Past Medical History: Past Medical History:  Diagnosis Date  . Anal fissure   . Back pain, chronic   . COPD (chronic obstructive pulmonary disease) ([place])   . Degenerative arthritis of spine   . Diabetes mellitus without complication ([place])   . Diverticulitis   . Emphysema   . Enlarged prostate   . Heart murmur   . Hypertension   . Nocturia   . Prostate cancer ([place]) 11/2013  . TIA (transient ischemic attack)     Encounter Details: CNP Questionnaire - 11/17/17 1531      Questionnaire   Patient Status  Not Applicable    Race  Black or African American    Location Patient Served At  Not Applicable    Insurance  Medicaid;Medicare    Uninsured  Not Applicable    Food  Yes, have food insecurities;Within past 12 months, worried food would run out with no money to buy more;Within past 12 months, food ran out with no money to buy more    Housing/Utilities  No permanent housing    Transportation  Yes, need transportation assistance    Interpersonal Safety  No, do not feel physically and emotionally safe where you currently live    Medication  No medication insecurities    Medical Provider  Yes    Referrals  Emergency Department    ED Visit Averted  Not Applicable    [place] staff requested I assess client.  Client was sitting in the day room, holding his abdomin and glimacing.  States pain in his "stomach" is a 10/10.  Reports pain started about one hour ago.Marland Kitchen  Describes pain as throughout his abdomin and radiating to his back.  Admits to chest pain and SOB.  Client agreed to the activation of EMS.  EMS arrived and transported client to ED

## 2017-11-17 NOTE — ED Notes (Signed)
Patient asking to eat and drink.  Asked EMT several times.  This RN explained rationale, due to chief complaints, for not eating or drinking.  Patient states "you haven't done anything for me since I've been here".  This Rn explained that we began blood work and chest xray and results will assist MD upon room placement.  This RN encouraged patient to give [place] just a short amount of time to find a room due to longest wait and upcoming discharges.  Patient agreed.

## 2017-11-17 NOTE — ED Notes (Signed)
Pt tolerated fluid & food

## 2017-11-17 NOTE — ED Provider Notes (Signed)
Patient placed in Quick Look pathway, seen and evaluated   Chief Complaint: abdominal pain  HPI:   Lower sharp abdominal pain, intermittent.  Noticed blood in urine one week ago.  H/o prostate cancer, multiple abdominal surgeries.  ROS: negative: fevers, chills, nausea, vomiting, changes in BM, dysuria.   Physical Exam:   Gen: No distress  Neuro: Awake and Alert  Skin: Warm    Focused Exam: Surgical scars noted in lower abdomen. TTP to suprapubic area. No CVAT. Negative Murphy's and McBurney's. No peritonitis. Pt will provide urine sample in triage.    Initiation of care has begun. The patient has been counseled on the process, plan, and necessity for staying for the completion/evaluation, and the remainder of the medical screening examination    Kinnie Feil, PA-C 11/17/17 [place], [place], DO 11/17/17 [phone]

## 2017-11-18 LAB — URINE CULTURE: Culture: NO GROWTH

## 2017-12-01 ENCOUNTER — Emergency Department (HOSPITAL_COMMUNITY)
Admission: EM | Admit: 2017-12-01 | Discharge: 2017-12-02 | Disposition: A | Discharge status: Home / Self Care | Payer: Medicare Other | Attending: Emergency Medicine | Admitting: Emergency Medicine

## 2017-12-01 ENCOUNTER — Other Ambulatory Visit: Payer: Self-pay

## 2017-12-01 DIAGNOSIS — Y93E9 Activity, other interior property and clothing maintenance: Secondary | ICD-10-CM | POA: Insufficient documentation

## 2017-12-01 DIAGNOSIS — I129 Hypertensive chronic kidney disease with stage 1 through stage 4 chronic kidney disease, or unspecified chronic kidney disease: Secondary | ICD-10-CM | POA: Insufficient documentation

## 2017-12-01 DIAGNOSIS — N189 Chronic kidney disease, unspecified: Secondary | ICD-10-CM | POA: Insufficient documentation

## 2017-12-01 DIAGNOSIS — Z7982 Long term (current) use of aspirin: Secondary | ICD-10-CM | POA: Insufficient documentation

## 2017-12-01 DIAGNOSIS — J449 Chronic obstructive pulmonary disease, unspecified: Secondary | ICD-10-CM | POA: Insufficient documentation

## 2017-12-01 DIAGNOSIS — Z7984 Long term (current) use of oral hypoglycemic drugs: Secondary | ICD-10-CM | POA: Insufficient documentation

## 2017-12-01 DIAGNOSIS — Y999 Unspecified external cause status: Secondary | ICD-10-CM | POA: Insufficient documentation

## 2017-12-01 DIAGNOSIS — E1122 Type 2 diabetes mellitus with diabetic chronic kidney disease: Secondary | ICD-10-CM | POA: Insufficient documentation

## 2017-12-01 DIAGNOSIS — Y92009 Unspecified place in unspecified non-institutional (private) residence as the place of occurrence of the external cause: Secondary | ICD-10-CM | POA: Insufficient documentation

## 2017-12-01 DIAGNOSIS — X58XXXA Exposure to other specified factors, initial encounter: Secondary | ICD-10-CM | POA: Insufficient documentation

## 2017-12-01 DIAGNOSIS — S0501XA Injury of conjunctiva and corneal abrasion without foreign body, right eye, initial encounter: Secondary | ICD-10-CM

## 2017-12-01 DIAGNOSIS — Z79899 Other long term (current) drug therapy: Secondary | ICD-10-CM | POA: Insufficient documentation

## 2017-12-01 DIAGNOSIS — F17200 Nicotine dependence, unspecified, uncomplicated: Secondary | ICD-10-CM | POA: Insufficient documentation

## 2017-12-01 DIAGNOSIS — S0591XA Unspecified injury of right eye and orbit, initial encounter: Secondary | ICD-10-CM | POA: Diagnosis present

## 2017-12-01 MED ORDER — FLUORESCEIN SODIUM 1 MG OP STRP
1.0000 | ORAL_STRIP | Freq: Once | OPHTHALMIC | Status: AC
  Administered 2017-12-02: 1 via OPHTHALMIC
  Filled 2017-12-01: qty 1

## 2017-12-01 MED ORDER — TETRACAINE HCL 0.5 % OP SOLN
2.0000 [drp] | Freq: Once | OPHTHALMIC | Status: AC
  Administered 2017-12-02: 2 [drp] via OPHTHALMIC
  Filled 2017-12-01: qty 4

## 2017-12-01 NOTE — ED Triage Notes (Signed)
Patient was changing a bulb and something fell into his eye. Denies vision change.

## 2017-12-02 MED ORDER — KETOROLAC TROMETHAMINE 0.5 % OP SOLN
1.0000 [drp] | Freq: Four times a day (QID) | OPHTHALMIC | Status: DC
  Administered 2017-12-02: 1 [drp] via OPHTHALMIC
  Filled 2017-12-02: qty 5

## 2017-12-02 MED ORDER — POLYMYXIN B-TRIMETHOPRIM 10000-0.1 UNIT/ML-% OP SOLN
1.0000 [drp] | OPHTHALMIC | Status: DC
  Administered 2017-12-02: 1 [drp] via OPHTHALMIC
  Filled 2017-12-02: qty 10

## 2017-12-02 NOTE — ED Provider Notes (Signed)
[facility] EMERGENCY DEPARTMENT Provider Note   CSN: [id number] Arrival date & time: 12/01/17  2245     History   Chief Complaint Chief Complaint  Patient presents with  . Eye Problem    HPI Ryan Hopkins is a 67 y.o. male.  The history is provided by the patient and medical records.    67 year old male with past medical history significant for chronic back pain, COPD, diabetes, emphysema, hypertension, prostate cancer, TIA, presenting to the ED for right eye pain.  States he was changing a light bulb when something flew into his eye.  States he is not sure what it was, thinks it may have been dust or some dirt.  States he was able to wash out his eye and it started feeling better but still feels like there is something in his eye.  He denies any changes in his vision.  Does report some light sensitivity.  He wears reading glasses but no corrective lenses.  No prior eye surgeries.  Past Medical History:  Diagnosis Date  . Anal fissure   . Back pain, chronic   . COPD (chronic obstructive pulmonary disease) ([place])   . Degenerative arthritis of spine   . Diabetes mellitus without complication ([place])   . Diverticulitis   . Emphysema   . Enlarged prostate   . Heart murmur   . Hypertension   . Nocturia   . Prostate cancer ([place]) 11/2013  . TIA (transient ischemic attack)     Patient Active Problem List   Diagnosis Date Noted  . Acute chest pain   . Chest pain 08/30/2015  . Facial droop 08/30/2015  . AKI (acute kidney injury) ([place]) 08/30/2015  . DM (diabetes mellitus) ([place]) 08/30/2015  . OSA (obstructive sleep apnea) 08/12/2015  . COPD (chronic obstructive pulmonary disease) ([place]) 08/12/2015  . Prostate cancer ([place]) 03/16/2014    Past Surgical History:  Procedure Laterality Date  . INGUINAL HERNIA REPAIR Right years ago  . LUMBAR DISC SURGERY  2007   L4-L5  . LYMPHADENECTOMY Bilateral 03/16/2014   Procedure: LYMPHADENECTOMY;  Surgeon: Ardis Hughs, MD;  Location: [facility];  Service: Urology;  Laterality: Bilateral;  . ROBOT ASSISTED LAPAROSCOPIC RADICAL PROSTATECTOMY N/A 03/16/2014   Procedure: ROBOTIC ASSISTED LAPAROSCOPIC RADICAL PROSTATECTOMY;  Surgeon: Ardis Hughs, MD;  Location: [facility];  Service: Urology;  Laterality: N/A;        Home Medications    Prior to Admission medications   Medication Sig Start Date End Date Taking? Authorizing Provider  albuterol (PROVENTIL HFA;VENTOLIN HFA) 108 (90 BASE) MCG/ACT inhaler Inhale 2 puffs into the lungs every 6 (six) hours as needed for wheezing or shortness of breath (wheezing).     [provider]  aspirin EC 325 MG EC tablet Take 1 tablet (325 mg total) by mouth daily. 09/02/15   Reyne Dumas, MD  cyclobenzaprine (FLEXERIL) 10 MG tablet Take 1 tablet (10 mg total) by mouth 2 (two) times daily as needed for muscle spasms. 05/19/16   Tegeler, Gwenyth Allegra, MD  Fluticasone-Salmeterol (ADVAIR DISKUS) 250-50 MCG/DOSE AEPB Inhale 2 puffs into the lungs 2 (two) times daily. 02/12/16   [provider]  ibuprofen (ADVIL,MOTRIN) 600 MG tablet Take 1 tablet (600 mg total) by mouth every 6 (six) hours as needed. 05/26/16   Carmin Muskrat, MD  lisinopril (PRINIVIL,ZESTRIL) 20 MG tablet Take 1 tablet (20 mg total) by mouth daily. 05/26/16   Carmin Muskrat, MD  metFORMIN (GLUCOPHAGE) 500 MG tablet Take 1  tablet (500 mg total) by mouth 2 (two) times daily with a meal. 09/09/15   Reyne Dumas, MD  naproxen (NAPROSYN) 500 MG tablet Take 1 tablet (500 mg total) by mouth 2 (two) times daily. 02/11/17   Robinson, [place] N, PA-C  omeprazole (PRILOSEC) 20 MG capsule Take 1 capsule (20 mg total) by mouth daily. 11/17/17   Carlisle Cater, PA-C  ondansetron (ZOFRAN ODT) 4 MG disintegrating tablet Take 1 tablet (4 mg total) by mouth every 8 (eight) hours as needed for nausea or vomiting. 11/17/17   Carlisle Cater, PA-C  oxyCODONE-acetaminophen (PERCOCET) 10-325 MG tablet Take 1 tablet by  mouth every 4 (four) hours as needed for pain.    [provider]  oxyCODONE-acetaminophen (PERCOCET/ROXICET) 5-325 MG tablet Take 1 tablet by mouth every 6 (six) hours as needed for severe pain. 05/19/16   Tegeler, Gwenyth Allegra, MD  sucralfate (CARAFATE) 1 g tablet Take 1 tablet (1 g total) by mouth 4 (four) times daily -  with meals and at bedtime. 11/17/17   Carlisle Cater, PA-C    Family History Family History  Problem Relation Age of Onset  . Colon cancer Sister   . Prostate cancer Father   . Diabetes Mother   . Heart failure Mother   . Cancer Mother        cervical    Social History Social History   Tobacco Use  . Smoking status: Current Some Day Smoker    Packs/day: 0.10    Years: 20.00    Pack years: 2.00    Last attempt to quit: 01/16/2014    Years since quitting: 3.8  . Smokeless tobacco: Never Used  Substance Use Topics  . Alcohol use: Yes    Comment: daly beer 40oz  . Drug use: Yes    Types: Marijuana    Comment: Quit marijuana years ago     Allergies   Vicodin [hydrocodone-acetaminophen]   Review of Systems Review of Systems  Eyes: Positive for pain.  All other systems reviewed and are negative.    Physical Exam Updated Vital Signs BP (!) 146/108 (BP Location: Right Arm)   Pulse 68   Temp 97.7 F (36.5 C) (Oral)   Resp 18   Ht 5\' 8"  (1.727 m)   Wt 95.3 kg (210 lb)   SpO2 93%   BMI 31.93 kg/m   Physical Exam  Constitutional: He is oriented to person, place, and time. He appears well-developed and well-nourished.  HENT:  Head: Normocephalic and atraumatic.  Mouth/Throat: Oropharynx is clear and moist.  Eyes: Pupils are equal, round, and reactive to light. Conjunctivae and EOM are normal.  No lid edema or erythema, right conjunctive is slightly injected, no visible foreign body in the eye; EOMs fully intact, no nystagmus, pupils reactive bilaterally Fluorescein stain with small corneal abrasion centrally as well conjunctival abrasion  inferiorly; there is no uptake; no visible hemorrhage  Neck: Normal range of motion.  Cardiovascular: Normal rate, regular rhythm and normal heart sounds.  Pulmonary/Chest: Effort normal and breath sounds normal. No stridor. No respiratory distress.  Abdominal: Soft. Bowel sounds are normal. There is no tenderness. There is no rebound.  Musculoskeletal: Normal range of motion.  Neurological: He is alert and oriented to person, place, and time.  Skin: Skin is warm and dry.  Psychiatric: He has a normal mood and affect.  Nursing note and vitals reviewed.    ED Treatments / Results  Labs (all labs ordered are listed, but only abnormal results are  displayed) Labs Reviewed - No data to display  EKG None  Radiology No results found.  Procedures Procedures (including critical care time)  Medications Ordered in ED Medications  ketorolac (ACULAR) 0.5 % ophthalmic solution 1 drop (1 drop Right Eye Given 12/02/17 0346)  trimethoprim-polymyxin b (POLYTRIM) ophthalmic solution 1 drop (1 drop Right Eye Given 12/02/17 0346)  tetracaine (PONTOCAINE) 0.5 % ophthalmic solution 2 drop (2 drops Right Eye Given by Other 12/02/17 0344)  fluorescein ophthalmic strip 1 strip (1 strip Right Eye Given by Other 12/02/17 0345)     Initial Impression / Assessment and Plan / ED Course  I have reviewed the triage vital signs and the nursing notes.  Pertinent labs & imaging results that were available during my care of the patient were reviewed by me and considered in my medical decision making (see chart for details).  67 year old male presenting to the ED with right eye pain.  Reports something got in his eye while changing a light bulb.  States it feels like there is still something in there.  He denies any changes in his vision.  He wears glasses occasionally but no corrective lenses.  He has no lid edema or erythema.  No visible foreign body in the eye.  EOMs are fully intact.  Pupils symmetric and reactive  bilaterally.  Fluorescein stain performed, does appear to have a corneal abrasion centrally as well as a small conjunctival abrasion inferiorly.  Patient started on Polytrim and Acular drops-- both given here in ED.  We will have him follow-up with ophthalmology.  He will return here for any new or worsening symptoms.  Final Clinical Impressions(s) / ED Diagnoses   Final diagnoses:  Abrasion of right cornea, initial encounter    ED Discharge Orders    None       Larene Pickett, PA-C 12/02/17 Kenney Houseman, MD 12/02/17 [phone]

## 2017-12-02 NOTE — Discharge Instructions (Signed)
Continue using the eye drops as directed. Follow-up with the eye doctor-- call in the morning to make an appt. Return to the ED for new or worsening symptoms.

## 2017-12-02 NOTE — ED Notes (Signed)
Patient verbalizes understanding of medications and discharge instructions. No further questions at this time. VSS and patient ambulatory at discharge.   

## 2017-12-20 NOTE — Congregational Nurse Program (Signed)
Client requested 3.00 reading glasses.  Reading glasses obtained and given to client

## 2017-12-23 NOTE — Congregational Nurse Program (Signed)
States had broken his reading glasses.  Requested another pair.  Requested the reading glasses be below a 3.0 strength

## 2017-12-27 NOTE — Congregational Nurse Program (Signed)
States was seen in the ED and prescriptions were written anti-biotics for his foot.  Referred to [facility] clinic where he has an appointment today.  Bus passes provided

## 2017-12-27 NOTE — Congregational Nurse Program (Signed)
2.75 reading glasses given.  Client has a dime size wound right foot.  States is diabetic.  Dressing provided.  Encouraged client to be seen ASAP to begin treatment.

## 2018-01-21 NOTE — Congregational Nurse Program (Signed)
Client had requested depends but later told me was able to get a package

## 2018-03-11 NOTE — Congregational Nurse Program (Signed)
Requested assistance with obtaining a anti-bacterial cream.  Assistance provided

## 2018-04-01 ENCOUNTER — Other Ambulatory Visit: Payer: Self-pay

## 2018-04-01 ENCOUNTER — Emergency Department (HOSPITAL_COMMUNITY): Payer: Medicare Other

## 2018-04-01 ENCOUNTER — Emergency Department (HOSPITAL_COMMUNITY)
Admission: EM | Admit: 2018-04-01 | Discharge: 2018-04-01 | Disposition: A | Discharge status: Home / Self Care | Payer: Medicare Other | Attending: Emergency Medicine | Admitting: Emergency Medicine

## 2018-04-01 ENCOUNTER — Encounter (HOSPITAL_COMMUNITY): Payer: Self-pay | Admitting: Emergency Medicine

## 2018-04-01 DIAGNOSIS — Z8546 Personal history of malignant neoplasm of prostate: Secondary | ICD-10-CM | POA: Diagnosis not present

## 2018-04-01 DIAGNOSIS — J449 Chronic obstructive pulmonary disease, unspecified: Secondary | ICD-10-CM | POA: Diagnosis not present

## 2018-04-01 DIAGNOSIS — L0291 Cutaneous abscess, unspecified: Secondary | ICD-10-CM

## 2018-04-01 DIAGNOSIS — E119 Type 2 diabetes mellitus without complications: Secondary | ICD-10-CM | POA: Insufficient documentation

## 2018-04-01 DIAGNOSIS — L02212 Cutaneous abscess of back [any part, except buttock]: Secondary | ICD-10-CM | POA: Insufficient documentation

## 2018-04-01 DIAGNOSIS — I10 Essential (primary) hypertension: Secondary | ICD-10-CM | POA: Diagnosis not present

## 2018-04-01 DIAGNOSIS — Z7984 Long term (current) use of oral hypoglycemic drugs: Secondary | ICD-10-CM | POA: Insufficient documentation

## 2018-04-01 DIAGNOSIS — F17210 Nicotine dependence, cigarettes, uncomplicated: Secondary | ICD-10-CM | POA: Diagnosis not present

## 2018-04-01 DIAGNOSIS — Z79899 Other long term (current) drug therapy: Secondary | ICD-10-CM | POA: Diagnosis not present

## 2018-04-01 LAB — CBC WITH DIFFERENTIAL/PLATELET
Abs Immature Granulocytes: 0.03 10*3/uL (ref 0.00–0.07)
Basophils Absolute: 0 10*3/uL (ref 0.0–0.1)
Basophils Relative: 1 %
EOS ABS: 0.2 10*3/uL (ref 0.0–0.5)
EOS PCT: 3 %
HEMATOCRIT: 47.1 % (ref 39.0–52.0)
HEMOGLOBIN: 15.1 g/dL (ref 13.0–17.0)
Immature Granulocytes: 1 %
LYMPHS PCT: 32 %
Lymphs Abs: 2.1 10*3/uL (ref 0.7–4.0)
MCH: 29.7 pg (ref 26.0–34.0)
MCHC: 32.1 g/dL (ref 30.0–36.0)
MCV: 92.5 fL (ref 80.0–100.0)
MONO ABS: 0.6 10*3/uL (ref 0.1–1.0)
MONOS PCT: 10 %
Neutro Abs: 3.5 10*3/uL (ref 1.7–7.7)
Neutrophils Relative %: 53 %
Platelets: 273 10*3/uL (ref 150–400)
RBC: 5.09 MIL/uL (ref 4.22–5.81)
RDW: 13.9 % (ref 11.5–15.5)
WBC: 6.5 10*3/uL (ref 4.0–10.5)
nRBC: 0 % (ref 0.0–0.2)

## 2018-04-01 LAB — BASIC METABOLIC PANEL
Anion gap: 7 (ref 5–15)
BUN: 19 mg/dL (ref 8–23)
CHLORIDE: 104 mmol/L (ref 98–111)
CO2: 24 mmol/L (ref 22–32)
CREATININE: 1.54 mg/dL — AB (ref 0.61–1.24)
Calcium: 9 mg/dL (ref 8.9–10.3)
GFR calc Af Amer: 53 mL/min — ABNORMAL LOW (ref >60)
GFR calc non Af Amer: 45 mL/min — ABNORMAL LOW (ref >60)
Glucose, Bld: 116 mg/dL — ABNORMAL HIGH (ref 70–99)
Potassium: 4 mmol/L (ref 3.5–5.1)
Sodium: 135 mmol/L (ref 135–145)

## 2018-04-01 MED ORDER — IOHEXOL 300 MG/ML  SOLN
100.0000 mL | Freq: Once | INTRAMUSCULAR | Status: AC | PRN
  Administered 2018-04-01: 100 mL via INTRAVENOUS

## 2018-04-01 MED ORDER — LIDOCAINE-EPINEPHRINE (PF) 2 %-1:200000 IJ SOLN
10.0000 mL | Freq: Once | INTRAMUSCULAR | Status: AC
  Administered 2018-04-01: 10 mL

## 2018-04-01 MED ORDER — DOXYCYCLINE HYCLATE 100 MG PO CAPS: 100.0000 mg | ORAL_CAPSULE | Freq: Two times a day (BID) | ORAL | 0 refills | Status: DC

## 2018-04-01 NOTE — ED Triage Notes (Signed)
The patient said the abscess started as a pimple two years ago and two weeks ago it started getting bigger.  Patient said  a week ago it started draining.  He rates the pain 10/10 and it keeps him up at night.  He said he took 600mg  of advil at 2000hrs.

## 2018-04-01 NOTE — Discharge Instructions (Addendum)
Pull out the packing in 2 days.  Return for worsening symptoms including pain, fever, or swelling.

## 2018-04-01 NOTE — ED Provider Notes (Signed)
[place] EMERGENCY DEPARTMENT Provider Note   CSN: [id number] Arrival date & time: 04/01/18  0457     History   Chief Complaint Chief Complaint  Patient presents with  . Abscess    HPI Ryan Hopkins is a 67 y.o. male.  Patient presents to the emergency department with a chief complaint of abscess on back.  He states it is been there for 2 weeks.  He denies any fevers chills.  States that it has been growing in size.  Reports increased pain.  It is draining pus.  The history is provided by the patient. No language interpreter was used.    Past Medical History:  Diagnosis Date  . Anal fissure   . Back pain, chronic   . COPD (chronic obstructive pulmonary disease) ([place])   . Degenerative arthritis of spine   . Diabetes mellitus without complication ([place])   . Diverticulitis   . Emphysema   . Enlarged prostate   . Heart murmur   . Hypertension   . Nocturia   . Prostate cancer ([place]) 11/2013  . TIA (transient ischemic attack)     Patient Active Problem List   Diagnosis Date Noted  . Acute chest pain   . Chest pain 08/30/2015  . Facial droop 08/30/2015  . AKI (acute kidney injury) ([place]) 08/30/2015  . DM (diabetes mellitus) ([place]) 08/30/2015  . OSA (obstructive sleep apnea) 08/12/2015  . COPD (chronic obstructive pulmonary disease) ([place]) 08/12/2015  . Prostate cancer ([place]) 03/16/2014    Past Surgical History:  Procedure Laterality Date  . INGUINAL HERNIA REPAIR Right years ago  . LUMBAR DISC SURGERY  2007   L4-L5  . LYMPHADENECTOMY Bilateral 03/16/2014   Procedure: LYMPHADENECTOMY;  Surgeon: Ardis Hughs, MD;  Location: [facility];  Service: Urology;  Laterality: Bilateral;  . ROBOT ASSISTED LAPAROSCOPIC RADICAL PROSTATECTOMY N/A 03/16/2014   Procedure: ROBOTIC ASSISTED LAPAROSCOPIC RADICAL PROSTATECTOMY;  Surgeon: Ardis Hughs, MD;  Location: [facility];  Service: Urology;  Laterality: N/A;        Home Medications    Prior to  Admission medications   Medication Sig Start Date End Date Taking? Authorizing Provider  albuterol (PROVENTIL HFA;VENTOLIN HFA) 108 (90 BASE) MCG/ACT inhaler Inhale 2 puffs into the lungs every 6 (six) hours as needed for wheezing or shortness of breath (wheezing).     [provider]  aspirin EC 325 MG EC tablet Take 1 tablet (325 mg total) by mouth daily. 09/02/15   Reyne Dumas, MD  cyclobenzaprine (FLEXERIL) 10 MG tablet Take 1 tablet (10 mg total) by mouth 2 (two) times daily as needed for muscle spasms. 05/19/16   Tegeler, Gwenyth Allegra, MD  Fluticasone-Salmeterol (ADVAIR DISKUS) 250-50 MCG/DOSE AEPB Inhale 2 puffs into the lungs 2 (two) times daily. 02/12/16   [provider]  ibuprofen (ADVIL,MOTRIN) 600 MG tablet Take 1 tablet (600 mg total) by mouth every 6 (six) hours as needed. 05/26/16   Carmin Muskrat, MD  lisinopril (PRINIVIL,ZESTRIL) 20 MG tablet Take 1 tablet (20 mg total) by mouth daily. 05/26/16   Carmin Muskrat, MD  metFORMIN (GLUCOPHAGE) 500 MG tablet Take 1 tablet (500 mg total) by mouth 2 (two) times daily with a meal. 09/09/15   Reyne Dumas, MD  naproxen (NAPROSYN) 500 MG tablet Take 1 tablet (500 mg total) by mouth 2 (two) times daily. 02/11/17   Robinson, [place] N, PA-C  omeprazole (PRILOSEC) 20 MG capsule Take 1 capsule (20 mg total) by mouth daily. 11/17/17  Carlisle Cater, PA-C  ondansetron (ZOFRAN ODT) 4 MG disintegrating tablet Take 1 tablet (4 mg total) by mouth every 8 (eight) hours as needed for nausea or vomiting. 11/17/17   Carlisle Cater, PA-C  oxyCODONE-acetaminophen (PERCOCET) 10-325 MG tablet Take 1 tablet by mouth every 4 (four) hours as needed for pain.    [provider]  oxyCODONE-acetaminophen (PERCOCET/ROXICET) 5-325 MG tablet Take 1 tablet by mouth every 6 (six) hours as needed for severe pain. 05/19/16   Tegeler, Gwenyth Allegra, MD  sucralfate (CARAFATE) 1 g tablet Take 1 tablet (1 g total) by mouth 4 (four) times daily -  with  meals and at bedtime. 11/17/17   Carlisle Cater, PA-C    Family History Family History  Problem Relation Age of Onset  . Colon cancer Sister   . Prostate cancer Father   . Diabetes Mother   . Heart failure Mother   . Cancer Mother        cervical    Social History Social History   Tobacco Use  . Smoking status: Current Some Day Smoker    Packs/day: 0.10    Years: 20.00    Pack years: 2.00    Last attempt to quit: 01/16/2014    Years since quitting: 4.2  . Smokeless tobacco: Never Used  Substance Use Topics  . Alcohol use: Yes    Comment: daly beer 40oz  . Drug use: Yes    Types: Marijuana    Comment: Quit marijuana years ago     Allergies   Vicodin [hydrocodone-acetaminophen]   Review of Systems Review of Systems  All other systems reviewed and are negative.    Physical Exam Updated Vital Signs BP 124/80   Pulse 65   Temp 97.9 F (36.6 C) (Oral)   Resp 18   SpO2 93%   Physical Exam  Constitutional: He is oriented to person, place, and time. He appears well-developed and well-nourished.  HENT:  Head: Normocephalic and atraumatic.  Eyes: Pupils are equal, round, and reactive to light. Conjunctivae and EOM are normal. Right eye exhibits no discharge. Left eye exhibits no discharge. No scleral icterus.  Neck: Normal range of motion. Neck supple. No JVD present.  Cardiovascular: Normal rate, regular rhythm and normal heart sounds. Exam reveals no gallop and no friction rub.  No murmur heard. Pulmonary/Chest: Effort normal and breath sounds normal. No respiratory distress. He has no wheezes. He has no rales. He exhibits no tenderness.  Abdominal: Soft. He exhibits no distension and no mass. There is no tenderness. There is no rebound and no guarding.  Musculoskeletal: Normal range of motion. He exhibits no edema or tenderness.  Neurological: He is alert and oriented to person, place, and time.  Skin: Skin is warm and dry.  3 x 3 cm abscess to left lumbar  paraspinal region, fluctuant, and draining purulent fluid  Psychiatric: He has a normal mood and affect. His behavior is normal. Judgment and thought content normal.  Nursing note and vitals reviewed.    ED Treatments / Results  Labs (all labs ordered are listed, but only abnormal results are displayed) Labs Reviewed  CBC WITH DIFFERENTIAL/PLATELET  BASIC METABOLIC PANEL    EKG None  Radiology No results found.  Procedures Procedures (including critical care time) INCISION AND DRAINAGE Performed by: [name] Consent: Verbal consent obtained. Risks and benefits: risks, benefits and alternatives were discussed Type: abscess  Body area: lumbar back  Anesthesia: local infiltration  Incision was made with a scalpel.  Local anesthetic: lidocaine 2% with epinephrine  Anesthetic total: 5 ml  Complexity: complex Blunt dissection to break up loculations  Drainage: purulent  Drainage amount: moderate  Packing material: 1/4 in iodoform gauze  Patient tolerance: Patient tolerated the procedure well with no immediate complications.    Medications Ordered in ED Medications  lidocaine-EPINEPHrine (XYLOCAINE W/EPI) 2 %-1:200000 (PF) injection 10 mL (10 mLs Infiltration Given 04/01/18 0533)     Initial Impression / Assessment and Plan / ED Course  I have reviewed the triage vital signs and the nursing notes.  Pertinent labs & imaging results that were available during my care of the patient were reviewed by me and considered in my medical decision making (see chart for details).    Patient with abscess on the skin near the spine.  Suspect that this is superficial only, but given the location will check advanced imaging.  Patient seen by and discussed with Dr. Wyvonnia Dusky, who agrees with plan to image the spine.   Bedside I&D.  Anticipate DC on doxy.   Patient signed out to Benitez, PA-C, who will follow-up on imaging.  Final Clinical Impressions(s) / ED Diagnoses    Final diagnoses:  Abscess    ED Discharge Orders    None       [name], PA-C 04/01/18 0631    [name], MD 04/01/18 [id number]

## 2018-04-01 NOTE — ED Notes (Signed)
Patient transported to CT 

## 2018-04-04 NOTE — Congregational Nurse Program (Signed)
States is having pain in the middle of his back. Noted a small reddened area with "head".  Hardened ridges noted approximately 2 cm from the center.  Encouraged client to see his [facility] to have the cyst/abscess drained.  Bus passes provided

## 2018-06-21 ENCOUNTER — Encounter (HOSPITAL_COMMUNITY): Payer: Self-pay | Admitting: Emergency Medicine

## 2018-06-21 ENCOUNTER — Emergency Department (HOSPITAL_COMMUNITY)
Admission: EM | Admit: 2018-06-21 | Discharge: 2018-06-21 | Discharge status: Against Medical Advice | Payer: Medicare Other | Attending: Emergency Medicine | Admitting: Emergency Medicine

## 2018-06-21 DIAGNOSIS — Z79899 Other long term (current) drug therapy: Secondary | ICD-10-CM | POA: Diagnosis not present

## 2018-06-21 DIAGNOSIS — Z7984 Long term (current) use of oral hypoglycemic drugs: Secondary | ICD-10-CM | POA: Insufficient documentation

## 2018-06-21 DIAGNOSIS — E119 Type 2 diabetes mellitus without complications: Secondary | ICD-10-CM | POA: Insufficient documentation

## 2018-06-21 DIAGNOSIS — F17210 Nicotine dependence, cigarettes, uncomplicated: Secondary | ICD-10-CM | POA: Diagnosis not present

## 2018-06-21 DIAGNOSIS — J449 Chronic obstructive pulmonary disease, unspecified: Secondary | ICD-10-CM | POA: Insufficient documentation

## 2018-06-21 DIAGNOSIS — I10 Essential (primary) hypertension: Secondary | ICD-10-CM | POA: Diagnosis not present

## 2018-06-21 DIAGNOSIS — Z7982 Long term (current) use of aspirin: Secondary | ICD-10-CM | POA: Insufficient documentation

## 2018-06-21 DIAGNOSIS — H6123 Impacted cerumen, bilateral: Secondary | ICD-10-CM | POA: Diagnosis present

## 2018-06-21 NOTE — ED Provider Notes (Signed)
[facility] EMERGENCY DEPARTMENT Provider Note   CSN: [id number] Arrival date & time: 06/21/18  1111    History   Chief Complaint No chief complaint on file.   HPI Ryan Hopkins is a 68 y.o. male.     HPI Patient presents with 2 complaints.  He is complaining of cerumen obstruction especially to the right ear with decreased hearing.  Denies any ear pain.  No headache.  Patient had abscess of the lower thoracic/upper lumbar back drained 3 months ago.  He is concerned the abscess is reforming.  He has some minor discomfort at the site.  There is no drainage.  No fever or chills. Past Medical History:  Diagnosis Date  . Anal fissure   . Back pain, chronic   . COPD (chronic obstructive pulmonary disease) ([place])   . Degenerative arthritis of spine   . Diabetes mellitus without complication ([place])   . Diverticulitis   . Emphysema   . Enlarged prostate   . Heart murmur   . Hypertension   . Nocturia   . Prostate cancer ([place]) 11/2013  . TIA (transient ischemic attack)     Patient Active Problem List   Diagnosis Date Noted  . Acute chest pain   . Chest pain 08/30/2015  . Facial droop 08/30/2015  . AKI (acute kidney injury) ([place]) 08/30/2015  . DM (diabetes mellitus) ([place]) 08/30/2015  . OSA (obstructive sleep apnea) 08/12/2015  . COPD (chronic obstructive pulmonary disease) ([place]) 08/12/2015  . Prostate cancer ([place]) 03/16/2014    Past Surgical History:  Procedure Laterality Date  . INGUINAL HERNIA REPAIR Right years ago  . LUMBAR DISC SURGERY  2007   L4-L5  . LYMPHADENECTOMY Bilateral 03/16/2014   Procedure: LYMPHADENECTOMY;  Surgeon: Ardis Hughs, MD;  Location: [facility];  Service: Urology;  Laterality: Bilateral;  . ROBOT ASSISTED LAPAROSCOPIC RADICAL PROSTATECTOMY N/A 03/16/2014   Procedure: ROBOTIC ASSISTED LAPAROSCOPIC RADICAL PROSTATECTOMY;  Surgeon: Ardis Hughs, MD;  Location: [facility];  Service: Urology;  Laterality: N/A;        Home  Medications    Prior to Admission medications   Medication Sig Start Date End Date Taking? Authorizing Provider  albuterol (PROVENTIL HFA;VENTOLIN HFA) 108 (90 BASE) MCG/ACT inhaler Inhale 2 puffs into the lungs every 6 (six) hours as needed for wheezing or shortness of breath (wheezing).     [provider]  aspirin EC 325 MG EC tablet Take 1 tablet (325 mg total) by mouth daily. 09/02/15   Reyne Dumas, MD  cyclobenzaprine (FLEXERIL) 10 MG tablet Take 1 tablet (10 mg total) by mouth 2 (two) times daily as needed for muscle spasms. 05/19/16   Tegeler, Gwenyth Allegra, MD  doxycycline (VIBRAMYCIN) 100 MG capsule Take 1 capsule (100 mg total) by mouth 2 (two) times daily. 04/01/18   [name], PA-C  Fluticasone-Salmeterol (ADVAIR DISKUS) 250-50 MCG/DOSE AEPB Inhale 2 puffs into the lungs 2 (two) times daily. 02/12/16   [provider]  ibuprofen (ADVIL,MOTRIN) 600 MG tablet Take 1 tablet (600 mg total) by mouth every 6 (six) hours as needed. 05/26/16   Carmin Muskrat, MD  lisinopril (PRINIVIL,ZESTRIL) 20 MG tablet Take 1 tablet (20 mg total) by mouth daily. 05/26/16   Carmin Muskrat, MD  metFORMIN (GLUCOPHAGE) 500 MG tablet Take 1 tablet (500 mg total) by mouth 2 (two) times daily with a meal. 09/09/15   Reyne Dumas, MD  naproxen (NAPROSYN) 500 MG tablet Take 1 tablet (500 mg total) by mouth 2 (two)  times daily. 02/11/17   Robinson, [place] N, PA-C  omeprazole (PRILOSEC) 20 MG capsule Take 1 capsule (20 mg total) by mouth daily. 11/17/17   Carlisle Cater, PA-C  ondansetron (ZOFRAN ODT) 4 MG disintegrating tablet Take 1 tablet (4 mg total) by mouth every 8 (eight) hours as needed for nausea or vomiting. 11/17/17   Carlisle Cater, PA-C  oxyCODONE-acetaminophen (PERCOCET) 10-325 MG tablet Take 1 tablet by mouth every 4 (four) hours as needed for pain.    [provider]  oxyCODONE-acetaminophen (PERCOCET/ROXICET) 5-325 MG tablet Take 1 tablet by mouth every 6 (six) hours as  needed for severe pain. 05/19/16   Tegeler, Gwenyth Allegra, MD  sucralfate (CARAFATE) 1 g tablet Take 1 tablet (1 g total) by mouth 4 (four) times daily -  with meals and at bedtime. 11/17/17   Carlisle Cater, PA-C    Family History Family History  Problem Relation Age of Onset  . Colon cancer Sister   . Prostate cancer Father   . Diabetes Mother   . Heart failure Mother   . Cancer Mother        cervical    Social History Social History   Tobacco Use  . Smoking status: Current Some Day Smoker    Packs/day: 0.10    Years: 20.00    Pack years: 2.00    Last attempt to quit: 01/16/2014    Years since quitting: 4.4  . Smokeless tobacco: Never Used  Substance Use Topics  . Alcohol use: Yes    Comment: daly beer 40oz  . Drug use: Not Currently    Types: Marijuana    Comment: Quit marijuana years ago     Allergies   Vicodin [hydrocodone-acetaminophen]   Review of Systems Review of Systems  Constitutional: Negative for chills and fever.  HENT: Positive for hearing loss. Negative for ear discharge and ear pain.   Eyes: Negative for visual disturbance.  Gastrointestinal: Negative for nausea and vomiting.  Genitourinary: Negative for difficulty urinating.  Musculoskeletal: Positive for back pain. Negative for myalgias.  Skin: Negative for wound.  Neurological: Negative for weakness and numbness.  All other systems reviewed and are negative.    Physical Exam Updated Vital Signs BP (!) 167/103 (BP Location: Right Arm)   Pulse 89   Temp 98.2 F (36.8 C) (Oral)   Resp 17   Ht 5\' 8"  (1.727 m)   Wt 95.3 kg   SpO2 100%   BMI 31.93 kg/m   Physical Exam Vitals signs and nursing note reviewed.  Constitutional:      General: He is not in acute distress.    Appearance: Normal appearance. He is well-developed. He is not ill-appearing.  HENT:     Head: Normocephalic and atraumatic.     Right Ear: There is impacted cerumen.     Left Ear: There is impacted cerumen.      Ears:     Comments: No external ear tenderness, swelling or erythema.    Mouth/Throat:     Mouth: Mucous membranes are moist.  Eyes:     Pupils: Pupils are equal, round, and reactive to light.  Neck:     Musculoskeletal: Normal range of motion and neck supple.  Cardiovascular:     Rate and Rhythm: Normal rate and regular rhythm.  Pulmonary:     Effort: Pulmonary effort is normal.     Breath sounds: Normal breath sounds.  Abdominal:     General: Bowel sounds are normal.     Palpations:  Abdomen is soft.     Tenderness: There is no abdominal tenderness. There is no guarding or rebound.  Musculoskeletal: Normal range of motion.        General: No tenderness.     Comments: Patient has small area of scar tissue just medially left of the T12 region of the spine.  There is no fluctuance.  No erythema.  No warmth.  Very minor tenderness to palpation.  Skin:    General: Skin is warm and dry.     Capillary Refill: Capillary refill takes less than 2 seconds.     Findings: No erythema or rash.  Neurological:     General: No focal deficit present.     Mental Status: He is alert and oriented to person, place, and time.  Psychiatric:        Mood and Affect: Mood normal.        Behavior: Behavior normal.      ED Treatments / Results  Labs (all labs ordered are listed, but only abnormal results are displayed) Labs Reviewed - No data to display  EKG None  Radiology No results found.  Procedures Procedures (including critical care time)  Medications Ordered in ED Medications - No data to display   Initial Impression / Assessment and Plan / ED Course  I have reviewed the triage vital signs and the nursing notes.  Pertinent labs & imaging results that were available during my care of the patient were reviewed by me and considered in my medical decision making (see chart for details).        Reassured the patient that he does not need antibiotics or repeat incision and drainage  of the area on his back.  Advised to return if the swelling, pain worsens.  We will irrigate patient's impacted ears.  Anticipate discharge home. Patient left prior to receiving discharge instructions. Final Clinical Impressions(s) / ED Diagnoses   Final diagnoses:  Bilateral impacted cerumen    ED Discharge Orders    None       Julianne Rice, MD 06/21/18 1416

## 2018-06-21 NOTE — ED Triage Notes (Signed)
Pt reports he can not hear out of his right ear due to earwax, reports chronic lower back pain for years. Pt here today due to worsening back pain.

## 2018-06-21 NOTE — ED Notes (Addendum)
This RN went to hourly round on the patient and found the room was empty. Pt unable to be found in the emergency department. Will notify provider.

## 2018-10-11 ENCOUNTER — Telehealth: Payer: Self-pay | Admitting: *Deleted

## 2018-10-11 NOTE — Telephone Encounter (Signed)
Copied from [place] [id number]. Topic: General - Call Back - No Documentation >> Oct 11, 2018  4:05 PM Berneta Levins wrote: Reason for CRM:  Pt states he is returning a call to [employer]

## 2018-10-12 ENCOUNTER — Ambulatory Visit: Payer: Medicare HMO | Admitting: Internal Medicine

## 2018-10-13 NOTE — Telephone Encounter (Signed)
Patient will call back and schedule a TOC when SCAT is available

## 2018-10-27 ENCOUNTER — Other Ambulatory Visit: Payer: Self-pay

## 2018-10-27 ENCOUNTER — Ambulatory Visit (INDEPENDENT_AMBULATORY_CARE_PROVIDER_SITE_OTHER): Payer: Medicare HMO | Admitting: Internal Medicine

## 2018-10-27 ENCOUNTER — Encounter: Payer: Self-pay | Admitting: Internal Medicine

## 2018-10-27 VITALS — BP 140/90 | HR 73 | Temp 98.4°F | Ht 68.0 in | Wt 199.0 lb

## 2018-10-27 DIAGNOSIS — C61 Malignant neoplasm of prostate: Secondary | ICD-10-CM

## 2018-10-27 DIAGNOSIS — I10 Essential (primary) hypertension: Secondary | ICD-10-CM | POA: Diagnosis not present

## 2018-10-27 DIAGNOSIS — R7309 Other abnormal glucose: Secondary | ICD-10-CM | POA: Diagnosis not present

## 2018-10-27 DIAGNOSIS — M25552 Pain in left hip: Secondary | ICD-10-CM

## 2018-10-27 DIAGNOSIS — J439 Emphysema, unspecified: Secondary | ICD-10-CM | POA: Diagnosis not present

## 2018-10-27 DIAGNOSIS — R7302 Impaired glucose tolerance (oral): Secondary | ICD-10-CM | POA: Diagnosis not present

## 2018-10-27 DIAGNOSIS — E669 Obesity, unspecified: Secondary | ICD-10-CM | POA: Insufficient documentation

## 2018-10-27 DIAGNOSIS — M25551 Pain in right hip: Secondary | ICD-10-CM

## 2018-10-27 LAB — POCT GLYCOSYLATED HEMOGLOBIN (HGB A1C): Hemoglobin A1C: 6.3 % — AB (ref 4.0–5.6)

## 2018-10-27 MED ORDER — MELOXICAM 15 MG PO TABS: 15.0000 mg | ORAL_TABLET | Freq: Every day | ORAL | 0 refills | Status: AC

## 2018-10-27 NOTE — Progress Notes (Signed)
New Patient Office Visit     CC/Reason for Visit: Establish care, follow-up chronic conditions Previous PCP: [facility] Last Visit: Unknown  HPI: Ryan Hopkins is a 68 y.o. male who is coming in today for the above mentioned reasons. Past Medical History is significant for: Hypertension, impaired glucose tolerance, COPD that has been well controlled on daily Symbicort and as needed albuterol.  He also has a history of prostate cancer in 2015 status post prostatectomy, he has had a right inguinal hernia operation.  He also complains of pain in both hips and legs.  He has seen orthopedics, Dr. Percell Miller, he is not recommending surgery.  Patient at that visit requested narcotics but he stated that he did not recommend narcotic treatment for his radicular pain and set him up to see a pain clinic and his appointment is in 2 weeks.  He needs an annual physical.  His only complaint today is continued bilateral leg pain and wonders what we can do for him until he is seen at the pain clinic in 2 weeks.   Past Medical/Surgical History: Past Medical History:  Diagnosis Date  . Anal fissure   . Back pain, chronic   . COPD (chronic obstructive pulmonary disease) ([place])   . Degenerative arthritis of spine   . Diabetes mellitus without complication ([place])   . Diverticulitis   . Emphysema   . Enlarged prostate   . Heart murmur   . Hypertension   . Nocturia   . Prostate cancer ([place]) 11/2013  . TIA (transient ischemic attack)     Past Surgical History:  Procedure Laterality Date  . INGUINAL HERNIA REPAIR Right years ago  . LUMBAR DISC SURGERY  2007   L4-L5  . LYMPHADENECTOMY Bilateral 03/16/2014   Procedure: LYMPHADENECTOMY;  Surgeon: Ardis Hughs, MD;  Location: [facility];  Service: Urology;  Laterality: Bilateral;  . ROBOT ASSISTED LAPAROSCOPIC RADICAL PROSTATECTOMY N/A 03/16/2014   Procedure: ROBOTIC ASSISTED LAPAROSCOPIC RADICAL PROSTATECTOMY;  Surgeon: Ardis Hughs, MD;   Location: [facility];  Service: Urology;  Laterality: N/A;    Social History:  reports that he quit smoking about 5 years ago. He has a 20.00 pack-year smoking history. He has never used smokeless tobacco. He reports previous alcohol use. He reports previous drug use. Drug: Marijuana.  Allergies: Allergies  Allergen Reactions  . Vicodin [Hydrocodone-Acetaminophen] Nausea Only    PT. STATED IT MAKES HIM NAUSEATED .    Family History:  Family History  Problem Relation Age of Onset  . Colon cancer Sister   . Prostate cancer Father   . Diabetes Mother   . Heart failure Mother   . Cancer Mother        cervical     Current Outpatient Medications:  .  albuterol (PROVENTIL HFA;VENTOLIN HFA) 108 (90 BASE) MCG/ACT inhaler, Inhale 2 puffs into the lungs every 6 (six) hours as needed for wheezing or shortness of breath (wheezing). , Disp: , Rfl:  .  aspirin EC 325 MG EC tablet, Take 1 tablet (325 mg total) by mouth daily., Disp: 30 tablet, Rfl: 0 .  Fluticasone-Salmeterol (ADVAIR DISKUS) 250-50 MCG/DOSE AEPB, Inhale 2 puffs into the lungs 2 (two) times daily., Disp: , Rfl:  .  lisinopril (PRINIVIL,ZESTRIL) 20 MG tablet, Take 1 tablet (20 mg total) by mouth daily., Disp: 30 tablet, Rfl: 0 .  oxyCODONE-acetaminophen (PERCOCET) 10-325 MG tablet, Take 1 tablet by mouth every 4 (four) hours as needed for pain., Disp: , Rfl:  .  atorvastatin (LIPITOR) 10 MG tablet, Take by mouth., Disp: , Rfl:  .  cloNIDine (CATAPRES) 0.1 MG tablet, Take 0.1 mg by mouth 2 (two) times daily., Disp: , Rfl:  .  meloxicam (MOBIC) 15 MG tablet, Take 1 tablet (15 mg total) by mouth daily., Disp: 30 tablet, Rfl: 0 .  metFORMIN (GLUCOPHAGE) 500 MG tablet, Take 1 tablet (500 mg total) by mouth 2 (two) times daily with a meal. (Patient not taking: Reported on 10/27/2018), Disp: 30 tablet, Rfl: 0 .  sildenafil (REVATIO) 20 MG tablet, TAKE ONE TABLET BY MOUTH ONE HOURS BEFORE SEX MAY REPEAT ONCE, Disp: , Rfl:  .  SYMBICORT 160-4.5  MCG/ACT inhaler, Inhale 2 puffs into the lungs 2 (two) times daily., Disp: , Rfl:   Review of Systems:  Constitutional: Denies fever, chills, diaphoresis, appetite change and fatigue.  HEENT: Denies photophobia, eye pain, redness, hearing loss, ear pain, congestion, sore throat, rhinorrhea, sneezing, mouth sores, trouble swallowing, neck pain, neck stiffness and tinnitus.   Respiratory: Denies SOB, DOE, cough, chest tightness,  and wheezing.   Cardiovascular: Denies chest pain, palpitations and leg swelling.  Gastrointestinal: Denies nausea, vomiting, abdominal pain, diarrhea, constipation, blood in stool and abdominal distention.  Genitourinary: Denies dysuria, urgency, frequency, hematuria, flank pain and difficulty urinating.  Endocrine: Denies: hot or cold intolerance, sweats, changes in hair or nails, polyuria, polydipsia. Musculoskeletal: Denies myalgias,  joint swelling, arthralgias  Skin: Denies pallor, rash and wound.  Neurological: Denies dizziness, seizures, syncope, weakness, light-headedness, numbness and headaches.  Hematological: Denies adenopathy. Easy bruising, personal or family bleeding history  Psychiatric/Behavioral: Denies suicidal ideation, mood changes, confusion, nervousness, sleep disturbance and agitation    Physical Exam: Vitals:   10/27/18 0823  BP: 140/90  Pulse: 73  Temp: 98.4 F (36.9 C)  TempSrc: Oral  SpO2: 98%  Weight: 199 lb (90.3 kg)  Height: 5\' 8"  (1.727 m)   Body mass index is 30.26 kg/m.  Constitutional: NAD, calm, comfortable Eyes: PERRL, lids and conjunctivae normal, wears corrective lenses ENMT: Mucous membranes are moist. Respiratory: clear to auscultation bilaterally, no wheezing, no crackles. Normal respiratory effort. No accessory muscle use.  Cardiovascular: Regular rate and rhythm, no murmurs / rubs / gallops. No extremity edema. 2+ pedal pulses. No carotid bruits.  Abdomen: no tenderness, no masses palpated. No  hepatosplenomegaly. Bowel sounds positive.  Musculoskeletal: no clubbing / cyanosis. No joint deformity upper and lower extremities. Good ROM, no contractures. Normal muscle tone.  Skin: no rashes, lesions, ulcers. No induration Neurologic: Grossly intact and nonfocal, ambulates with a cane due to his bilateral-like pain  psychiatric: Normal judgment and insight. Alert and oriented x 3. Normal mood.    Impression and Plan:  IGT (impaired glucose tolerance)  -A1c in office today is 6.3, have advised better food choices, increase physical activity for weight loss, no medications at this time.  Obesity (BMI 30.0-34.9)  -Discussed healthy lifestyle, including increased physical activity and better food choices to promote weight loss.  Pulmonary emphysema, unspecified emphysema type ([place]), Chronic  -Compensated. -Continue Symbicort and as needed albuterol. -Has not had a flareup in years, does not follow with pulmonary.  Prostate cancer ([place]), Chronic  -No routine urology follow-up. -Consider PSA at next physical.  Pain of both hip joints  -Patient is requesting assistance with pain medication today. -He already has appointment with pain clinic scheduled in 2 weeks. -After reviewing Dr. Debroah Loop note, will not give him narcotics, will allow him meloxicam 30 tablets until seen at the pain clinic. -  He has been advised not to use meloxicam in conjunction with ibuprofen.  Essential hypertension -BP is elevated today at 140/90. -He is on lisinopril 20 mg, hydrochlorothiazide 12.5 mg as well as clonidine 0.1 mg twice daily.  He states he is compliant with his medications and took them this morning. -If blood pressure continues to be elevated at subsequent visits, may need to adjust medications.     Patient Instructions  -Nice meeting you today!!  -Schedule follow up in 3 months for your physical. Please come in fasting that day.  -Meloxicam 1 tablet daily for pain until seen by the  pain clinic.     Lelon Frohlich, MD [facility]

## 2018-10-27 NOTE — Patient Instructions (Addendum)
-  Nice meeting you today!!  -Schedule follow up in 3 months for your physical. Please come in fasting that day.  -Meloxicam 1 tablet daily for pain until seen by the pain clinic.

## 2018-11-03 DIAGNOSIS — Z79891 Long term (current) use of opiate analgesic: Secondary | ICD-10-CM | POA: Diagnosis not present

## 2018-11-03 DIAGNOSIS — M79604 Pain in right leg: Secondary | ICD-10-CM | POA: Diagnosis not present

## 2018-11-03 DIAGNOSIS — M79661 Pain in right lower leg: Secondary | ICD-10-CM | POA: Diagnosis not present

## 2018-11-03 DIAGNOSIS — M961 Postlaminectomy syndrome, not elsewhere classified: Secondary | ICD-10-CM | POA: Diagnosis not present

## 2018-11-03 DIAGNOSIS — G894 Chronic pain syndrome: Secondary | ICD-10-CM | POA: Diagnosis not present

## 2018-11-03 DIAGNOSIS — G8929 Other chronic pain: Secondary | ICD-10-CM | POA: Diagnosis not present

## 2018-11-03 DIAGNOSIS — M545 Low back pain: Secondary | ICD-10-CM | POA: Diagnosis not present

## 2018-11-03 DIAGNOSIS — F1120 Opioid dependence, uncomplicated: Secondary | ICD-10-CM | POA: Diagnosis not present

## 2018-11-03 DIAGNOSIS — M79605 Pain in left leg: Secondary | ICD-10-CM | POA: Diagnosis not present

## 2018-11-03 DIAGNOSIS — M79662 Pain in left lower leg: Secondary | ICD-10-CM | POA: Diagnosis not present

## 2018-11-16 ENCOUNTER — Telehealth: Payer: Self-pay | Admitting: Internal Medicine

## 2018-11-16 DIAGNOSIS — M25551 Pain in right hip: Secondary | ICD-10-CM

## 2018-11-16 DIAGNOSIS — J439 Emphysema, unspecified: Secondary | ICD-10-CM

## 2018-11-16 NOTE — Telephone Encounter (Signed)
I am ok with all of this. What do you need me to do?

## 2018-11-16 NOTE — Telephone Encounter (Signed)
Pt is calling and would like home health aid to assist him with [employer] and pt needs a scooter due to neuropathy in both legs. Pt also has pinched nerve in back , copd. Pt has [employer]

## 2018-11-17 NOTE — Telephone Encounter (Signed)
Referral placed for home health.  Patient's insurance will not cover a scooter.  Patient is aware and will call back as needed.

## 2018-11-18 NOTE — Telephone Encounter (Signed)
Patient is calling in stating that his insurance covers 80% of the costs. States he spoke with insurance and they agreed. Providers line to call is [phone] for the scooter. Please advise and call back is [phone].

## 2018-11-23 NOTE — Telephone Encounter (Signed)
Order sent to [employer]

## 2018-12-01 DIAGNOSIS — M79605 Pain in left leg: Secondary | ICD-10-CM | POA: Diagnosis not present

## 2018-12-01 DIAGNOSIS — M79661 Pain in right lower leg: Secondary | ICD-10-CM | POA: Diagnosis not present

## 2018-12-01 DIAGNOSIS — F1120 Opioid dependence, uncomplicated: Secondary | ICD-10-CM | POA: Diagnosis not present

## 2018-12-01 DIAGNOSIS — M79604 Pain in right leg: Secondary | ICD-10-CM | POA: Diagnosis not present

## 2018-12-01 DIAGNOSIS — Z79891 Long term (current) use of opiate analgesic: Secondary | ICD-10-CM | POA: Diagnosis not present

## 2018-12-01 DIAGNOSIS — M79662 Pain in left lower leg: Secondary | ICD-10-CM | POA: Diagnosis not present

## 2018-12-01 DIAGNOSIS — M961 Postlaminectomy syndrome, not elsewhere classified: Secondary | ICD-10-CM | POA: Diagnosis not present

## 2018-12-01 DIAGNOSIS — G894 Chronic pain syndrome: Secondary | ICD-10-CM | POA: Diagnosis not present

## 2018-12-01 DIAGNOSIS — G8929 Other chronic pain: Secondary | ICD-10-CM | POA: Diagnosis not present

## 2018-12-01 DIAGNOSIS — M545 Low back pain: Secondary | ICD-10-CM | POA: Diagnosis not present

## 2018-12-01 NOTE — Telephone Encounter (Signed)
Derek with [employer] called to speak with Dr. Ledell Noss assistant regarding an order they received. No answer on FC line. Please return call. [phone]

## 2018-12-02 NOTE — Telephone Encounter (Signed)
Left message on machine for Ryan Hopkins returning his call. CRM

## 2018-12-05 ENCOUNTER — Telehealth: Payer: Self-pay | Admitting: Internal Medicine

## 2018-12-05 NOTE — Telephone Encounter (Signed)
Derek with [employer] returning Rachel's call regarding the order for wheelchair. Please call back to assist.

## 2018-12-05 NOTE — Telephone Encounter (Signed)
Derick of [employer] called back.He ended the call before NT could speak with him.

## 2018-12-08 NOTE — Telephone Encounter (Signed)
Derrick calling back again. Please call back:  [phone]

## 2018-12-13 NOTE — Telephone Encounter (Signed)
Insurance will not cover scooter through [employer].  Will try [facility].

## 2018-12-13 NOTE — Telephone Encounter (Signed)
Patient is aware 

## 2019-01-02 ENCOUNTER — Telehealth: Payer: Self-pay | Admitting: Internal Medicine

## 2019-01-02 NOTE — Telephone Encounter (Signed)
Derick calling from [employer] and stated that he recived an order for a new power mobility device. Derick states that he has a few questions regarding chart notes. Derick states that he will need notes that states that he need a new scooter and why he would need this. Please call back.   [id number]

## 2019-01-03 NOTE — Telephone Encounter (Signed)
Order was faxed to [facility] and patient is aware.

## 2019-01-19 DIAGNOSIS — M79662 Pain in left lower leg: Secondary | ICD-10-CM | POA: Diagnosis not present

## 2019-01-19 DIAGNOSIS — Z79891 Long term (current) use of opiate analgesic: Secondary | ICD-10-CM | POA: Diagnosis not present

## 2019-01-19 DIAGNOSIS — M79605 Pain in left leg: Secondary | ICD-10-CM | POA: Diagnosis not present

## 2019-01-19 DIAGNOSIS — M545 Low back pain: Secondary | ICD-10-CM | POA: Diagnosis not present

## 2019-01-19 DIAGNOSIS — G8929 Other chronic pain: Secondary | ICD-10-CM | POA: Diagnosis not present

## 2019-01-19 DIAGNOSIS — G894 Chronic pain syndrome: Secondary | ICD-10-CM | POA: Diagnosis not present

## 2019-01-19 DIAGNOSIS — F1120 Opioid dependence, uncomplicated: Secondary | ICD-10-CM | POA: Diagnosis not present

## 2019-01-19 DIAGNOSIS — M961 Postlaminectomy syndrome, not elsewhere classified: Secondary | ICD-10-CM | POA: Diagnosis not present

## 2019-01-19 DIAGNOSIS — M79604 Pain in right leg: Secondary | ICD-10-CM | POA: Diagnosis not present

## 2019-01-19 DIAGNOSIS — M79661 Pain in right lower leg: Secondary | ICD-10-CM | POA: Diagnosis not present

## 2019-01-25 ENCOUNTER — Other Ambulatory Visit: Payer: Self-pay | Admitting: Internal Medicine

## 2019-01-25 ENCOUNTER — Telehealth: Payer: Self-pay | Admitting: *Deleted

## 2019-01-25 DIAGNOSIS — M25551 Pain in right hip: Secondary | ICD-10-CM

## 2019-01-25 DIAGNOSIS — C61 Malignant neoplasm of prostate: Secondary | ICD-10-CM

## 2019-01-25 NOTE — Telephone Encounter (Signed)
Referral placed for pain management. Appointment scheduled

## 2019-01-25 NOTE — Telephone Encounter (Signed)
Copied from [place] [id number]. Topic: General - Call Back - No Documentation >> Jan 24, 2019  2:22 PM Erick Blinks wrote: Reason for CRM: Pt is requesting nurse call back about "appts and specialists" [phone]

## 2019-01-31 ENCOUNTER — Ambulatory Visit: Payer: Self-pay | Admitting: Internal Medicine

## 2019-02-01 DIAGNOSIS — M542 Cervicalgia: Secondary | ICD-10-CM | POA: Diagnosis not present

## 2019-02-01 DIAGNOSIS — M545 Low back pain: Secondary | ICD-10-CM | POA: Diagnosis not present

## 2019-02-01 DIAGNOSIS — Z79899 Other long term (current) drug therapy: Secondary | ICD-10-CM | POA: Diagnosis not present

## 2019-02-01 DIAGNOSIS — M25512 Pain in left shoulder: Secondary | ICD-10-CM | POA: Diagnosis not present

## 2019-02-01 DIAGNOSIS — G8929 Other chronic pain: Secondary | ICD-10-CM | POA: Diagnosis not present

## 2019-02-09 ENCOUNTER — Ambulatory Visit: Payer: Self-pay | Admitting: Internal Medicine

## 2019-02-15 DIAGNOSIS — G8929 Other chronic pain: Secondary | ICD-10-CM | POA: Diagnosis not present

## 2019-02-15 DIAGNOSIS — M25512 Pain in left shoulder: Secondary | ICD-10-CM | POA: Diagnosis not present

## 2019-02-15 DIAGNOSIS — M129 Arthropathy, unspecified: Secondary | ICD-10-CM | POA: Diagnosis not present

## 2019-02-15 DIAGNOSIS — M542 Cervicalgia: Secondary | ICD-10-CM | POA: Diagnosis not present

## 2019-02-15 DIAGNOSIS — Z79899 Other long term (current) drug therapy: Secondary | ICD-10-CM | POA: Diagnosis not present

## 2019-02-15 DIAGNOSIS — Z7189 Other specified counseling: Secondary | ICD-10-CM | POA: Diagnosis not present

## 2019-02-15 DIAGNOSIS — M545 Low back pain: Secondary | ICD-10-CM | POA: Diagnosis not present

## 2019-02-23 ENCOUNTER — Other Ambulatory Visit: Payer: Self-pay

## 2019-02-23 ENCOUNTER — Observation Stay (HOSPITAL_COMMUNITY)
Admission: EM | Admit: 2019-02-23 | Discharge: 2019-02-23 | Discharge status: Against Medical Advice | Payer: Medicare HMO | Attending: Internal Medicine | Admitting: Internal Medicine

## 2019-02-23 ENCOUNTER — Emergency Department (HOSPITAL_COMMUNITY): Payer: Medicare HMO

## 2019-02-23 ENCOUNTER — Encounter (HOSPITAL_COMMUNITY): Payer: Self-pay | Admitting: Emergency Medicine

## 2019-02-23 DIAGNOSIS — Z20828 Contact with and (suspected) exposure to other viral communicable diseases: Secondary | ICD-10-CM | POA: Insufficient documentation

## 2019-02-23 DIAGNOSIS — R0789 Other chest pain: Secondary | ICD-10-CM

## 2019-02-23 DIAGNOSIS — Z8546 Personal history of malignant neoplasm of prostate: Secondary | ICD-10-CM | POA: Diagnosis not present

## 2019-02-23 DIAGNOSIS — G4733 Obstructive sleep apnea (adult) (pediatric): Secondary | ICD-10-CM | POA: Diagnosis not present

## 2019-02-23 DIAGNOSIS — M549 Dorsalgia, unspecified: Secondary | ICD-10-CM | POA: Diagnosis not present

## 2019-02-23 DIAGNOSIS — Z87891 Personal history of nicotine dependence: Secondary | ICD-10-CM | POA: Diagnosis not present

## 2019-02-23 DIAGNOSIS — Z8673 Personal history of transient ischemic attack (TIA), and cerebral infarction without residual deficits: Secondary | ICD-10-CM | POA: Diagnosis not present

## 2019-02-23 DIAGNOSIS — R531 Weakness: Principal | ICD-10-CM | POA: Insufficient documentation

## 2019-02-23 DIAGNOSIS — R05 Cough: Secondary | ICD-10-CM | POA: Diagnosis not present

## 2019-02-23 DIAGNOSIS — Z791 Long term (current) use of non-steroidal anti-inflammatories (NSAID): Secondary | ICD-10-CM | POA: Diagnosis not present

## 2019-02-23 DIAGNOSIS — R490 Dysphonia: Secondary | ICD-10-CM | POA: Insufficient documentation

## 2019-02-23 DIAGNOSIS — R9431 Abnormal electrocardiogram [ECG] [EKG]: Secondary | ICD-10-CM | POA: Insufficient documentation

## 2019-02-23 DIAGNOSIS — E119 Type 2 diabetes mellitus without complications: Secondary | ICD-10-CM | POA: Insufficient documentation

## 2019-02-23 DIAGNOSIS — Z79899 Other long term (current) drug therapy: Secondary | ICD-10-CM | POA: Diagnosis not present

## 2019-02-23 DIAGNOSIS — I10 Essential (primary) hypertension: Secondary | ICD-10-CM | POA: Diagnosis not present

## 2019-02-23 DIAGNOSIS — E669 Obesity, unspecified: Secondary | ICD-10-CM | POA: Diagnosis not present

## 2019-02-23 DIAGNOSIS — I441 Atrioventricular block, second degree: Secondary | ICD-10-CM | POA: Diagnosis present

## 2019-02-23 DIAGNOSIS — Z7951 Long term (current) use of inhaled steroids: Secondary | ICD-10-CM | POA: Diagnosis not present

## 2019-02-23 DIAGNOSIS — R079 Chest pain, unspecified: Secondary | ICD-10-CM | POA: Insufficient documentation

## 2019-02-23 DIAGNOSIS — Z7984 Long term (current) use of oral hypoglycemic drugs: Secondary | ICD-10-CM | POA: Diagnosis not present

## 2019-02-23 DIAGNOSIS — G8929 Other chronic pain: Secondary | ICD-10-CM | POA: Diagnosis not present

## 2019-02-23 DIAGNOSIS — J439 Emphysema, unspecified: Secondary | ICD-10-CM | POA: Insufficient documentation

## 2019-02-23 DIAGNOSIS — Z6834 Body mass index (BMI) 34.0-34.9, adult: Secondary | ICD-10-CM | POA: Insufficient documentation

## 2019-02-23 DIAGNOSIS — Z7982 Long term (current) use of aspirin: Secondary | ICD-10-CM | POA: Insufficient documentation

## 2019-02-23 LAB — URINALYSIS, ROUTINE W REFLEX MICROSCOPIC
Bilirubin Urine: NEGATIVE
Glucose, UA: NEGATIVE mg/dL
Hgb urine dipstick: NEGATIVE
Ketones, ur: NEGATIVE mg/dL
Leukocytes,Ua: NEGATIVE
Nitrite: NEGATIVE
Protein, ur: NEGATIVE mg/dL
Specific Gravity, Urine: 1.013 (ref 1.005–1.030)
pH: 8 (ref 5.0–8.0)

## 2019-02-23 LAB — CBC
HCT: 46.6 % (ref 39.0–52.0)
Hemoglobin: 15.5 g/dL (ref 13.0–17.0)
MCH: 31.6 pg (ref 26.0–34.0)
MCHC: 33.3 g/dL (ref 30.0–36.0)
MCV: 95.1 fL (ref 80.0–100.0)
Platelets: 217 10*3/uL (ref 150–400)
RBC: 4.9 MIL/uL (ref 4.22–5.81)
RDW: 13.5 % (ref 11.5–15.5)
WBC: 7.2 10*3/uL (ref 4.0–10.5)
nRBC: 0 % (ref 0.0–0.2)

## 2019-02-23 LAB — BASIC METABOLIC PANEL
Anion gap: 10 (ref 5–15)
BUN: 18 mg/dL (ref 8–23)
CO2: 22 mmol/L (ref 22–32)
Calcium: 9.6 mg/dL (ref 8.9–10.3)
Chloride: 107 mmol/L (ref 98–111)
Creatinine, Ser: 1.36 mg/dL — ABNORMAL HIGH (ref 0.61–1.24)
GFR calc Af Amer: >60 mL/min (ref >60)
GFR calc non Af Amer: 52 mL/min — ABNORMAL LOW (ref >60)
Glucose, Bld: 178 mg/dL — ABNORMAL HIGH (ref 70–99)
Potassium: 4.1 mmol/L (ref 3.5–5.1)
Sodium: 139 mmol/L (ref 135–145)

## 2019-02-23 LAB — MAGNESIUM: Magnesium: 1.9 mg/dL (ref 1.7–2.4)

## 2019-02-23 LAB — HIV ANTIBODY (ROUTINE TESTING W REFLEX): HIV Screen 4th Generation wRfx: NONREACTIVE

## 2019-02-23 LAB — TROPONIN I (HIGH SENSITIVITY)
Troponin I (High Sensitivity): 10 ng/L (ref <18)
Troponin I (High Sensitivity): 9 ng/L (ref <18)

## 2019-02-23 LAB — HEMOGLOBIN A1C
Hgb A1c MFr Bld: 5.9 % — ABNORMAL HIGH (ref 4.8–5.6)
Mean Plasma Glucose: 122.63 mg/dL

## 2019-02-23 LAB — TSH: TSH: 1.309 u[IU]/mL (ref 0.350–4.500)

## 2019-02-23 LAB — BRAIN NATRIURETIC PEPTIDE: B Natriuretic Peptide: 332.2 pg/mL — ABNORMAL HIGH (ref 0.0–100.0)

## 2019-02-23 LAB — SARS CORONAVIRUS 2 (TAT 6-24 HRS): SARS Coronavirus 2: NEGATIVE

## 2019-02-23 MED ORDER — MELOXICAM 7.5 MG PO TABS: 15.0000 mg | ORAL_TABLET | Freq: Every day | ORAL | Status: DC

## 2019-02-23 MED ORDER — ENOXAPARIN SODIUM 40 MG/0.4ML ~~LOC~~ SOLN: 40.0000 mg | SUBCUTANEOUS | Status: DC

## 2019-02-23 MED ORDER — SODIUM CHLORIDE 0.9 % IV BOLUS
500.0000 mL | Freq: Once | INTRAVENOUS | Status: AC
  Administered 2019-02-23: 500 mL via INTRAVENOUS

## 2019-02-23 MED ORDER — SODIUM CHLORIDE 0.9% FLUSH: 3.0000 mL | Freq: Once | INTRAVENOUS | Status: DC

## 2019-02-23 MED ORDER — ALBUTEROL SULFATE HFA 108 (90 BASE) MCG/ACT IN AERS: 2.0000 | INHALATION_SPRAY | Freq: Four times a day (QID) | RESPIRATORY_TRACT | Status: DC | PRN

## 2019-02-23 MED ORDER — OXYCODONE-ACETAMINOPHEN 5-325 MG PO TABS: 1.0000 | ORAL_TABLET | Freq: Three times a day (TID) | ORAL | Status: DC | PRN

## 2019-02-23 MED ORDER — ATORVASTATIN CALCIUM 10 MG PO TABS: 10.0000 mg | ORAL_TABLET | Freq: Every day | ORAL | Status: DC

## 2019-02-23 MED ORDER — ASPIRIN EC 325 MG PO TBEC: 325.0000 mg | DELAYED_RELEASE_TABLET | Freq: Every day | ORAL | Status: DC

## 2019-02-23 MED ORDER — LISINOPRIL 20 MG PO TABS: 20.0000 mg | ORAL_TABLET | Freq: Every day | ORAL | Status: DC

## 2019-02-23 MED ORDER — MOMETASONE FURO-FORMOTEROL FUM 200-5 MCG/ACT IN AERO: 2.0000 | INHALATION_SPRAY | Freq: Two times a day (BID) | RESPIRATORY_TRACT | Status: DC

## 2019-02-23 MED ORDER — INSULIN ASPART 100 UNIT/ML ~~LOC~~ SOLN: 0.0000 [IU] | Freq: Three times a day (TID) | SUBCUTANEOUS | Status: DC

## 2019-02-23 NOTE — Consult Note (Addendum)
Cardiology Consultation:   Patient ID: Ryan Hopkins MRN: [id number]; DOB: 1951-02-08  Admit date: 02/23/2019 Date of Consult: 02/23/2019  Primary Care Provider: System, Pcp Not In Primary Cardiologist: None Primary Electrophysiologist:  None    Patient Profile:   Ryan Hopkins is a 68 y.o. male with a hx of HTN, COPD, prostate cancer s/p  Prostatectomy 2015 with resultant urinary frequency/urgency/incontinance, TIA, chronic back/hip pain, DM, who is being seen today for the evaluation of heart block, CP at the request of Ryan Hopkins, Ryan Hopkins.  History of Present Illness:   Ryan Hopkins sought attn at the ER with c/p CP and dysuria, noted with heart block on his EKG felt to be CHB, and EP is asked to evaluate further.  LABS K+ 4.1  BUN/Creat 18/1.36 HS Trop 10 > 9 WBC 7.2 H/H 15/46 Plts 217  Home meds reviewed for potential rate limiting, nodal blocking agents Catapres 0.1mg  PO BID noted   The patient is quite vague initially , mentions a general distrust of people and doctors.  Though finally does become more conversation and informative. He has a few complaints  He has had a constant (never gone, but gets better/worse) burning in the center of his chest, this is most noted and at its worst when he is laying in bed and supine, but does seem to get less when he takes a deep breath in.  It has been a constant nagging discomfort for several weeks if not months.    He also has a sharp L sided CP that is random and fleeting, lasts seconds only, no clear trigger, is not exertional with no associated symptoms.  He mentions for many months he feels like he does not have the same energy [place] usual.  Tends to get more fatigued with his usual activities, with a component of breathlessness, like he needs to get a deep breath in.  This happens occasionally regardless of exertional or rest.  He lives in a retirement community, has been staying home in his own apartment and not doing much of  anything staying socially distant and wearing a mask when out.  He mentions someone in the community had COVID (by rumor, but he doesn't know who) and tells me he by nature avoids people anyway.  Does not think he has had any exposures.  No fever or symptoms of illness.    He mentions he was at his PMD last week or the week prior without fever at check there, seen for ear wax/plugged ear, has been irrigating it with drops/water, this has given him some balance change, maybe a little dizziness.  Outside of this he denies any h/o syncope.  Once this summer was outside and became overheated, weak, and did need get help back inside, though once in and cooled off felt better.     He has noted increased urinary frequency and burning when he is intentially urinating, though not when incontinent.      Heart Pathway Score:     Past Medical History:  Diagnosis Date  . Anal fissure   . Back pain, chronic   . COPD (chronic obstructive pulmonary disease) ([place])   . Degenerative arthritis of spine   . Diabetes mellitus without complication ([place])   . Diverticulitis   . Emphysema   . Enlarged prostate   . Heart murmur   . Hypertension   . Nocturia   . Prostate cancer ([place]) 11/2013  . TIA (transient ischemic attack)     Past Surgical History:  Procedure Laterality Date  . INGUINAL HERNIA REPAIR Right years ago  . LUMBAR DISC SURGERY  2007   L4-L5  . LYMPHADENECTOMY Bilateral 03/16/2014   Procedure: LYMPHADENECTOMY;  Surgeon: Ardis Hughs, MD;  Location: [facility];  Service: Urology;  Laterality: Bilateral;  . ROBOT ASSISTED LAPAROSCOPIC RADICAL PROSTATECTOMY N/A 03/16/2014   Procedure: ROBOTIC ASSISTED LAPAROSCOPIC RADICAL PROSTATECTOMY;  Surgeon: Ardis Hughs, MD;  Location: [facility];  Service: Urology;  Laterality: N/A;     Home Medications:  Prior to Admission medications   Medication Sig Start Date End Date Taking? Authorizing Provider  albuterol (PROVENTIL HFA;VENTOLIN HFA) 108  (90 BASE) MCG/ACT inhaler Inhale 2 puffs into the lungs every 6 (six) hours as needed for wheezing or shortness of breath (wheezing).     [provider]  aspirin EC 325 MG EC tablet Take 1 tablet (325 mg total) by mouth daily. 09/02/15   Reyne Dumas, MD  atorvastatin (LIPITOR) 10 MG tablet Take by mouth.    [provider]  cloNIDine (CATAPRES) 0.1 MG tablet Take 0.1 mg by mouth 2 (two) times daily. 10/14/18   [provider]  Fluticasone-Salmeterol (ADVAIR DISKUS) 250-50 MCG/DOSE AEPB Inhale 2 puffs into the lungs 2 (two) times daily. 02/12/16   [provider]  lisinopril (PRINIVIL,ZESTRIL) 20 MG tablet Take 1 tablet (20 mg total) by mouth daily. 05/26/16   Carmin Muskrat, MD  meloxicam (MOBIC) 15 MG tablet Take 1 tablet (15 mg total) by mouth daily. 10/27/18   Isaac Bliss, Rayford Halsted, MD  metFORMIN (GLUCOPHAGE) 500 MG tablet Take 1 tablet (500 mg total) by mouth 2 (two) times daily with a meal. Patient not taking: Reported on 10/27/2018 09/09/15   Reyne Dumas, MD  oxyCODONE-acetaminophen (PERCOCET) 10-325 MG tablet Take 1 tablet by mouth every 4 (four) hours as needed for pain.    [provider]  sildenafil (REVATIO) 20 MG tablet TAKE ONE TABLET BY MOUTH ONE HOURS BEFORE SEX MAY REPEAT ONCE 10/14/18   [provider]  SYMBICORT 160-4.5 MCG/ACT inhaler Inhale 2 puffs into the lungs 2 (two) times daily. 10/14/18   [provider]    Inpatient Medications: Scheduled Meds: . sodium chloride flush  3 mL Intravenous Once   Continuous Infusions:  PRN Meds:   Allergies:    Allergies  Allergen Reactions  . Vicodin [Hydrocodone-Acetaminophen] Nausea Only    PT. STATED IT MAKES HIM NAUSEATED .    Social History:   Social History   Socioeconomic History  . Marital status: Legally Separated    Spouse name: Not on file  . Number of children: 3  . Years of education: Not on file  . Highest education level: Not on file   Occupational History  . Not on file  Social Needs  . Financial resource strain: Not on file  . Food insecurity    Worry: Not on file    Inability: Not on file  . Transportation needs    Medical: Not on file    Non-medical: Not on file  Tobacco Use  . Smoking status: Former Smoker    Packs/day: 1.00    Years: 20.00    Pack years: 20.00    Quit date: 01/16/2013    Years since quitting: 6.1  . Smokeless tobacco: Never Used  Substance and Sexual Activity  . Alcohol use: Not Currently    Comment: daly beer 40oz  . Drug use: Not Currently    Types: Marijuana    Comment: Quit marijuana years  ago  . Sexual activity: Not on file  Lifestyle  . Physical activity    Days per week: Not on file    Minutes per session: Not on file  . Stress: Not on file  Relationships  . Social Herbalist on phone: Not on file    Gets together: Not on file    Attends religious service: Not on file    Active member of club or organization: Not on file    Attends meetings of clubs or organizations: Not on file    Relationship status: Not on file  . Intimate partner violence    Fear of current or ex partner: Not on file    Emotionally abused: Not on file    Physically abused: Not on file    Forced sexual activity: Not on file  Other Topics Concern  . Not on file  Social History Narrative  . Not on file    Family History:   Family History  Problem Relation Age of Onset  . Colon cancer Sister   . Prostate cancer Father   . Diabetes Mother   . Heart failure Mother   . Cancer Mother        cervical     ROS:  Please see the history of present illness.  All other ROS reviewed and negative.     Physical Exam/Data:   Vitals:   02/23/19 0750 02/23/19 1112 02/23/19 1133 02/23/19 1230  BP:   (!) 181/117 (!) 127/104  Pulse:  65    Resp:  13 17 18   Temp:      TempSrc:      SpO2:  100%    Weight: 90.3 kg       Intake/Output Summary (Last 24 hours) at 02/23/2019 1304 Last data  filed at 02/23/2019 1241 Gross per 24 hour  Intake 500 ml  Output -  Net 500 ml   Last 3 Weights 02/23/2019 10/27/2018 06/21/2018  Weight (lbs) 199 lb 1.2 oz 199 lb 210 lb  Weight (kg) 90.3 kg 90.266 kg 95.255 kg     Body mass index is 30.27 kg/m.  General:  Well nourished, well developed, in no acute distress HEENT: normal Lymph: no adenopathy Neck: no JVD Endocrine:  No thryomegaly Vascular: No carotid bruits Cardiac:  RRR; no murmurs, gallops or rubs Lungs:  CTA b/l, no wheezing, rhonchi or rales  Abd: soft, nontender Ext: no edema Musculoskeletal:  No deformities Skin: warm and dry  Neuro: no gross focal abnormalities noted Psych:  Normal affect   EKG:  The EKG was personally reviewed and demonstrates:    V rate is 63, QRS 47ms, shythm suggestive of Mobitz one,  No ST/ischemic looking changes appreciated  OLD 11/17/17: SR marked 1st degree AVblock491ms, QRS 91 02/26/17 SRT 106, marked 1st degree AVblock, PR 237ms, QRS 73ms 02/11/17 SR, 1st degree ABlock, PR 360ms, QRS 7ms  Telemetry:  Telemetry was personally reviewed and demonstrates:   SR, 1st degree AVBlock and mobitz 1, narrow QRS, V rates are 50's-70's  When I am with him and he is talking rates 70's-80 range    Relevant CV Studies:  09/02/15: TTE Study Conclusions - Left ventricle: The cavity size was normal. There was mild   concentric hypertrophy. Systolic function was normal. Wall motion   was normal; there were no regional wall motion abnormalities.   There was fusion of early and atrial contributions to ventricular   filling  Laboratory Data:  High Sensitivity Troponin:  Recent Labs  Lab 02/23/19 0936 02/23/19 1139  TROPONINIHS 10 9     Chemistry Recent Labs  Lab 02/23/19 0755  NA 139  K 4.1  CL 107  CO2 22  GLUCOSE 178*  BUN 18  CREATININE 1.36*  CALCIUM 9.6  GFRNONAA 52*  GFRAA >60  ANIONGAP 10    No results for input(s): PROT, ALBUMIN, AST, ALT, ALKPHOS, BILITOT in the last 168  hours. Hematology Recent Labs  Lab 02/23/19 0755  WBC 7.2  RBC 4.90  HGB 15.5  HCT 46.6  MCV 95.1  MCH 31.6  MCHC 33.3  RDW 13.5  PLT 217   BNPNo results for input(s): BNP, PROBNP in the last 168 hours.  DDimer No results for input(s): DDIMER in the last 168 hours.   Radiology/Studies:  Dg Chest 2 View  Result Date: 02/23/2019 CLINICAL DATA:  69 year old male with cough chest pain shortness of breath and weakness. EXAM: CHEST - 2 VIEW COMPARISON:  Chest radiograph 11/17/2017 and earlier. FINDINGS: PA and lateral views. Stable mild tortuosity of the descending thoracic aorta. Cardiac size at the upper limits of normal. Other mediastinal contours are within normal limits. Visualized tracheal air column is within normal limits. Lung volumes are within normal limits. Both lungs appear stable and clear. Negative visible bowel gas pattern. No acute osseous abnormality identified. IMPRESSION: No acute cardiopulmonary abnormality. Electronically Signed   By: Genevie Ann M.D.   On: 02/23/2019 10:02    Assessment and Plan:   1. Heart block     EKG is reviewed by Dr. Rayann Heman, Mobitz I     Longstanding long 1st degree AVblock goes back years     Narrow QRS     No significant bradycardia or higher AV block is noted on telemetry     With minimal activity in the stretcher, moving arm/talking HR increases to 70's  He reports being off the clonidine for months, no other noted nodal blocking agents I could not elicit any clear symptoms of bradycardia No h/o syncope I do not see clear indication for pacing at this time He is being admitted, tele bed is OK, monitor HR response to ambulation/activity once upstairs Update echo Avoid nodal blocking agents for BP management   2. CP     Neg HS Trop x2     Both his CP complaints sound atypical     + family hx of CAD, age, DM, are risk factors     Check echo, if OK do not anticipate need for ischemic w/u given atypical fetures of his complaint    3. 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 Unfortunately, he left AMA today before he could be further assessed by me  Thompson Grayer MD, [place] 02/23/2019

## 2019-02-23 NOTE — ED Notes (Addendum)
Pt stated he wanted to leave, because "we're not doing nothing for him, but taking his blood and he's paying all this money for nothing." I explained to the pt we're running tests and that I ordered him a lunch tray, because he was upset he hasn't eaten, but he still wanted to leave. Pt refused to sign AMA form.  Notified Dr. Earnest Conroy.

## 2019-02-23 NOTE — ED Provider Notes (Signed)
[facility] EMERGENCY DEPARTMENT Provider Note   CSN: [id number] Arrival date & time: 02/23/19  [id number]     History   Chief Complaint Chief Complaint  Patient presents with  . Abdominal Pain  . Dysuria  . Weakness    HPI Ryan Hopkins is a 68 y.o. male with past medical history significant for chronic back pain, COPD, diabetes, diverticulitis, emphysema, large prostate compressive cancer, retention, TIA presents to emergency department today with chief complaints of chest pain and dysuria x1 month.  Chest pain Patient reports laying in bed at night.  Pain is located in the middle of his chest and does not radiate.  He describes the pain as pressure and burning sensation.  To get rid of the pain he forces himself to hyperventilate.  He is reporting that he wakes him up multiple times throughout the night because of the pain and has had difficulty sleeping. He admits to associated generalized weakness and shortness of breath. Shortness of breath is present on exertion.  He denies any cardiac history besides hypertension and history of a heart murmur.  Chart review shows he had an echo in 2017 that was overall unremarkable. He does not have a cardiologist.   Dysuria Pt has history of prostate cancer. He had radical prostatectomy in 2015. Since then he has had urinary frequency and incontinence. He has to wear a depends because of incontinence. He reports over the last month he has increased urinary frequency and urgency but denies dysuria.  Admits to associated suprapubic pain.  States pain is intermittent.  States pain comes and goes.  Rates pain 6 out of 10 in severity.  Denies gross hematuria.  He denies fever, chills, diaphoresis, radiation to left/arm or jaw, nausea, diaphoresis, lower extremity edema, diarrhea.  History provided by patient with additional history obtained from chart review.       Past Medical History:  Diagnosis Date  . Anal fissure   . Back pain,  chronic   . COPD (chronic obstructive pulmonary disease) ([place])   . Degenerative arthritis of spine   . Diabetes mellitus without complication ([place])   . Diverticulitis   . Emphysema   . Enlarged prostate   . Heart murmur   . Hypertension   . Nocturia   . Prostate cancer ([place]) 11/2013  . TIA (transient ischemic attack)     Patient Active Problem List   Diagnosis Date Noted  . IGT (impaired glucose tolerance) 10/27/2018  . Obesity (BMI 30.0-34.9) 10/27/2018  . HTN (hypertension) 10/27/2018  . Acute chest pain   . Chest pain 08/30/2015  . Facial droop 08/30/2015  . AKI (acute kidney injury) ([place]) 08/30/2015  . OSA (obstructive sleep apnea) 08/12/2015  . COPD (chronic obstructive pulmonary disease) ([place]) 08/12/2015  . Prostate cancer ([place]) 03/16/2014    Past Surgical History:  Procedure Laterality Date  . INGUINAL HERNIA REPAIR Right years ago  . LUMBAR DISC SURGERY  2007   L4-L5  . LYMPHADENECTOMY Bilateral 03/16/2014   Procedure: LYMPHADENECTOMY;  Surgeon: Ardis Hughs, MD;  Location: [facility]S;  Service: Urology;  Laterality: Bilateral;  . ROBOT ASSISTED LAPAROSCOPIC RADICAL PROSTATECTOMY N/A 03/16/2014   Procedure: ROBOTIC ASSISTED LAPAROSCOPIC RADICAL PROSTATECTOMY;  Surgeon: Ardis Hughs, MD;  Location: [facility];  Service: Urology;  Laterality: N/A;        Home Medications    Prior to Admission medications   Medication Sig Start Date End Date Taking? Authorizing Provider  albuterol (PROVENTIL HFA;VENTOLIN HFA) 108 (90  BASE) MCG/ACT inhaler Inhale 2 puffs into the lungs every 6 (six) hours as needed for wheezing or shortness of breath (wheezing).     [provider]  aspirin EC 325 MG EC tablet Take 1 tablet (325 mg total) by mouth daily. 09/02/15   Reyne Dumas, MD  atorvastatin (LIPITOR) 10 MG tablet Take by mouth.    [provider]  cloNIDine (CATAPRES) 0.1 MG tablet Take 0.1 mg by mouth 2 (two) times daily. 10/14/18   [provider]  Fluticasone-Salmeterol (ADVAIR DISKUS) 250-50 MCG/DOSE AEPB Inhale 2 puffs into the lungs 2 (two) times daily. 02/12/16   [provider]  lisinopril (PRINIVIL,ZESTRIL) 20 MG tablet Take 1 tablet (20 mg total) by mouth daily. 05/26/16   Carmin Muskrat, MD  meloxicam (MOBIC) 15 MG tablet Take 1 tablet (15 mg total) by mouth daily. 10/27/18   Isaac Bliss, Rayford Halsted, MD  metFORMIN (GLUCOPHAGE) 500 MG tablet Take 1 tablet (500 mg total) by mouth 2 (two) times daily with a meal. Patient not taking: Reported on 10/27/2018 09/09/15   Reyne Dumas, MD  oxyCODONE-acetaminophen (PERCOCET) 10-325 MG tablet Take 1 tablet by mouth every 4 (four) hours as needed for pain.    [provider]  sildenafil (REVATIO) 20 MG tablet TAKE ONE TABLET BY MOUTH ONE HOURS BEFORE SEX MAY REPEAT ONCE 10/14/18   [provider]  SYMBICORT 160-4.5 MCG/ACT inhaler Inhale 2 puffs into the lungs 2 (two) times daily. 10/14/18   [provider]    Family History Family History  Problem Relation Age of Onset  . Colon cancer Sister   . Prostate cancer Father   . Diabetes Mother   . Heart failure Mother   . Cancer Mother        cervical    Social History Social History   Tobacco Use  . Smoking status: Former Smoker    Packs/day: 1.00    Years: 20.00    Pack years: 20.00    Quit date: 01/16/2013    Years since quitting: 6.1  . Smokeless tobacco: Never Used  Substance Use Topics  . Alcohol use: Not Currently    Comment: daly beer 40oz  . Drug use: Not Currently    Types: Marijuana    Comment: Quit marijuana years ago     Allergies   Vicodin [hydrocodone-acetaminophen]   Review of Systems Review of Systems  Constitutional: Negative for chills and fever.  HENT: Negative for congestion, rhinorrhea, sinus pressure and sore throat.   Eyes: Negative for pain and redness.  Respiratory: Positive for shortness of breath. Negative for cough and wheezing.   Cardiovascular:  Positive for chest pain. Negative for palpitations and leg swelling.  Gastrointestinal: Positive for abdominal pain. Negative for abdominal distention, constipation, diarrhea, nausea and vomiting.  Genitourinary: Positive for frequency and urgency. Negative for difficulty urinating, discharge, dysuria, hematuria, penile pain, penile swelling, scrotal swelling and testicular pain.  Musculoskeletal: Negative for arthralgias, back pain, myalgias and neck pain.  Skin: Negative for rash and wound.  Allergic/Immunologic: Positive for immunocompromised state (diabetic).  Neurological: Negative for dizziness, syncope, weakness, numbness and headaches.  Psychiatric/Behavioral: Negative for confusion.     Physical Exam Updated Vital Signs BP (!) 151/94 (BP Location: Left Arm)   Pulse 63   Temp 98.5 F (36.9 C) (Oral)   Resp 16   Wt 90.3 kg   SpO2 99%   BMI 30.27 kg/m   Physical Exam Vitals signs and nursing note reviewed.  Constitutional:  General: He is not in acute distress.    Appearance: He is not ill-appearing.  HENT:     Head: Normocephalic and atraumatic.     Right Ear: Tympanic membrane and external ear normal.     Left Ear: Tympanic membrane and external ear normal.     Nose: Nose normal.     Mouth/Throat:     Mouth: Mucous membranes are moist.     Pharynx: Oropharynx is clear.  Eyes:     General: No scleral icterus.       Right eye: No discharge.        Left eye: No discharge.     Extraocular Movements: Extraocular movements intact.     Conjunctiva/sclera: Conjunctivae normal.     Pupils: Pupils are equal, round, and reactive to light.  Neck:     Musculoskeletal: Normal range of motion.     Vascular: No JVD.  Cardiovascular:     Rate and Rhythm: Normal rate and regular rhythm.     Pulses: Normal pulses.          Radial pulses are 2+ on the right side and 2+ on the left side.     Heart sounds: Normal heart sounds.  Pulmonary:     Comments: Lungs clear to  auscultation in all fields. Symmetric chest rise. No wheezing, rales, or rhonchi. Chest:     Chest wall: No tenderness.  Abdominal:     Tenderness: There is no right CVA tenderness or left CVA tenderness.     Comments: Abdomen is soft, non-distended.  Mild tenderness to palpation of suprapubic region. No rigidity, no guarding. No peritoneal signs.  Musculoskeletal: Normal range of motion.     Right lower leg: No edema.     Left lower leg: No edema.  Skin:    General: Skin is warm and dry.     Capillary Refill: Capillary refill takes less than 2 seconds.     Findings: No rash.  Neurological:     Mental Status: He is oriented to person, place, and time.     GCS: GCS eye subscore is 4. GCS verbal subscore is 5. GCS motor subscore is 6.     Comments: Fluent speech, no facial droop.  Psychiatric:        Behavior: Behavior normal.      ED Treatments / Results  Labs (all labs ordered are listed, but only abnormal results are displayed) Labs Reviewed  BASIC METABOLIC PANEL - Abnormal; Notable for the following components:      Result Value   Glucose, Bld 178 (*)    Creatinine, Ser 1.36 (*)    GFR calc non Af Amer 52 (*)    All other components within normal limits  SARS CORONAVIRUS 2 (TAT 6-24 HRS)  CBC  URINALYSIS, ROUTINE W REFLEX MICROSCOPIC  BRAIN NATRIURETIC PEPTIDE  CBG MONITORING, ED  TROPONIN I (HIGH SENSITIVITY)  TROPONIN I (HIGH SENSITIVITY)    EKG Date/Time:  Thursday February 23 2019 07:59:46 EDT Ventricular Rate:  63 PR Interval:    QRS Duration: 86 QT Interval:  418 QTC Calculation: 427 R Axis:   79 Text Interpretation:  Critical Test Result: AV Block 3rd degree AV block Abnormal ECG Confirmed by Davonna Belling [phone]) on 02/23/2019 11:08:50 AM   Radiology Dg Chest 2 View  Result Date: 02/23/2019 CLINICAL DATA:  68 year old male with cough chest pain shortness of breath and weakness. EXAM: CHEST - 2 VIEW COMPARISON:  Chest radiograph 11/17/2017 and  earlier. FINDINGS:  PA and lateral views. Stable mild tortuosity of the descending thoracic aorta. Cardiac size at the upper limits of normal. Other mediastinal contours are within normal limits. Visualized tracheal air column is within normal limits. Lung volumes are within normal limits. Both lungs appear stable and clear. Negative visible bowel gas pattern. No acute osseous abnormality identified. IMPRESSION: No acute cardiopulmonary abnormality. Electronically Signed   By: Genevie Ann M.D.   On: 02/23/2019 10:02    Procedures Procedures (including critical care time)  Medications Ordered in ED Medications  sodium chloride flush (NS) 0.9 % injection 3 mL (has no administration in time range)  sodium chloride 0.9 % bolus 500 mL (500 mLs Intravenous New Bag/Given 02/23/19 1141)     Initial Impression / Assessment and Plan / ED Course  I have reviewed the triage vital signs and the nursing notes.  Pertinent labs & imaging results that were available during my care of the patient were reviewed by me and considered in my medical decision making (see chart for details).  Patient seen and examined. Patient nontoxic appearing, in no apparent distress, vitals WNL.  He is afebrile, no hypoxia, no tachycardia.  Lungs are clear to auscultation all fields, normal work of breathing, no wheezing or rhonchi.  Abdominal exam shows tenderness to suprapubic region without peritoneal signs.  No guarding no rigidity.  Pt had basic labs ordered in triage.  I have reviewed those and added on troponin and BNP.  He had an EKG on arrival that is concerning for Mobitz type 1 vs 3rd degree heart block. He is on cardiac monitor and stable at this time.  UA is unremarkable, no signs of infection.  Labs are significant for an of 1.36, this appears close to patient's baseline.  No leukocytosis, severe electrolyte arrangement no anemia.  First troponin is 10.  Second troponin and BNP are pending.  Chest x-ray with no acute  findings.  Case discussed with cardiology who will be down to evaluate the patient and recommending medical admission. This case was discussed with Dr. Alvino Chapel who has seen the patient and agrees with plan to admit. Spoke with Dr. Earnest Conroy with hospitalist service who agrees to assume care of patient and bring into the hospital for further evaluation and management.   Pt left AMA after being admitted. He had been evaluated by cardiology and the hospitalist and was aware of the plan. Admitting team notified. I was informed of his departure after he left thedepartment.   Portions of this note were generated with Lobbyist. Dictation errors may occur despite best attempts at proofreading.    Final Clinical Impressions(s) / ED Diagnoses   Final diagnoses:  Weakness  Chest pain, unspecified type    ED Discharge Orders    None       Flint Melter 02/23/19 1529    Davonna Belling, MD 02/23/19 [id number]

## 2019-02-23 NOTE — H&P (Signed)
History and Physical    DOA: 02/23/2019  PCP: System, Pcp Not In  Patient coming from: [place] [facility] [employer] [facility] Complaint: Chest pain  HPI: Ryan Hopkins is a 68 y.o. male with history h/o COPD, hypertension, TIA, prostate cancer, chronic back pain who follows pain clinic and is on chronic opiates presented to the ED with complaints of retrosternal burning chest pain-mostly nocturnal, unrelieved by positional changes.  Not associated with nausea/vomiting or palpitations or sweating.  He states he was diagnosed with GERD in the past but stopped taking medications few years back.  He does report generalized weakness and fatigue with no energy like he used to before.  He states he has had these complaints intermittently over the last month or so.  Patient gets tangential in conversation but states most recently he was evaluated by his primary care physician for right ear pain diagnosed with cerumen impaction.  He states he was concerned that his prior ear infection was coming back but the doctors did not give him antibiotics (last PCP note indicates that patient left the clinic prior to obtaining discharge instructions).  So he bought over-the-counter cerumen drops/saline irrigation kit and used it last Tuesday.  He reports feeling intermittently dizzy since then.  He also reports occasional sore throat and noticing change in voice at times.  He then goes on to complain how doctors in the clinics are only concerned about pneumonia not about treating patients.  He denies any fevers or chills.  Denies any shortness of breath.  He thinks a lady in his apartment complex was diagnosed with Covid but denies any direct contact. Work-up in the ED revealed abnormal EKG (electronic report as third-degree AV block) which concerned ED team and cardiology was consulted.  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  Cardiology APP was present in the room during my evaluation of patient.  He was advised admission for observation and minimizing medications that can worsen AV block to which he agreed.    Review of Systems: As per HPI otherwise 10 point review of systems negative.    Past Medical History:  Diagnosis Date  . Anal fissure   . Back pain, chronic   . COPD (chronic obstructive pulmonary disease) ([place])   . Degenerative arthritis of spine   . Diabetes mellitus without complication ([place])   . Diverticulitis   . Emphysema   . Enlarged prostate   . Heart murmur   . Hypertension   . Nocturia   . Prostate cancer ([place]) 11/2013  . TIA (transient ischemic attack)     Past Surgical History:  Procedure Laterality Date  . INGUINAL HERNIA REPAIR Right years ago  . LUMBAR DISC SURGERY  2007   L4-L5  . LYMPHADENECTOMY Bilateral 03/16/2014   Procedure: LYMPHADENECTOMY;  Surgeon: Ardis Hughs, MD;  Location: [facility];  Service: Urology;  Laterality: Bilateral;  . ROBOT ASSISTED LAPAROSCOPIC RADICAL PROSTATECTOMY N/A 03/16/2014   Procedure: ROBOTIC ASSISTED LAPAROSCOPIC RADICAL PROSTATECTOMY;  Surgeon: Ardis Hughs, MD;  Location: [facility];  Service: Urology;  Laterality: N/A;    Social history:  reports that he quit smoking about 6 years ago. He has a 20.00 pack-year smoking history. He has never used smokeless tobacco. He reports previous alcohol use. He reports previous drug use. Drug: Marijuana.   Allergies  Allergen Reactions  . Vicodin [Hydrocodone-Acetaminophen] Nausea Only  PT. STATED IT MAKES HIM NAUSEATED .    Family History  Problem Relation Age of Onset  . Colon cancer Sister   . Prostate cancer Father   . Diabetes Mother   . Heart failure Mother   . Cancer Mother        cervical      Prior to Admission medications   Medication Sig Start Date End Date Taking? Authorizing Provider  albuterol (PROVENTIL HFA;VENTOLIN HFA) 108 (90 BASE) MCG/ACT  inhaler Inhale 2 puffs into the lungs every 6 (six) hours as needed for wheezing or shortness of breath (wheezing).    Yes [provider]  atorvastatin (LIPITOR) 10 MG tablet Take 10 mg by mouth daily.    Yes [provider]  cloNIDine (CATAPRES) 0.1 MG tablet Take 0.1 mg by mouth 2 (two) times daily. 10/14/18  Yes [provider]  Fluticasone-Salmeterol (ADVAIR DISKUS) 250-50 MCG/DOSE AEPB Inhale 2 puffs into the lungs 2 (two) times daily. 02/12/16  Yes [provider]  ibuprofen (ADVIL) 800 MG tablet Take 800 mg by mouth every 8 (eight) hours as needed for moderate pain.   Yes [provider]  lisinopril (PRINIVIL,ZESTRIL) 20 MG tablet Take 1 tablet (20 mg total) by mouth daily. 05/26/16  Yes Carmin Muskrat, MD  meloxicam (MOBIC) 15 MG tablet Take 1 tablet (15 mg total) by mouth daily. 10/27/18  Yes Isaac Bliss, Rayford Halsted, MD  metFORMIN (GLUCOPHAGE) 500 MG tablet Take 1 tablet (500 mg total) by mouth 2 (two) times daily with a meal. 09/09/15  Yes Reyne Dumas, MD  oxyCODONE-acetaminophen (PERCOCET) 10-325 MG tablet Take 1 tablet by mouth every 4 (four) hours as needed for pain.   Yes [provider]  sildenafil (REVATIO) 20 MG tablet Take 20 mg by mouth as needed.  10/14/18  Yes [provider]  SYMBICORT 160-4.5 MCG/ACT inhaler Inhale 2 puffs into the lungs 2 (two) times daily. 10/14/18  Yes [provider]    Physical Exam: Vitals:   02/23/19 1230 02/23/19 1300 02/23/19 1330 02/23/19 1400  BP: (!) 127/104 (!) 153/109 (!) 176/134 (!) 167/129  Pulse:      Resp: _0 Temp:      TempSrc:      SpO2:      Weight:        Constitutional: NAD, calm, comfortable Eyes: PERRL, lids and conjunctivae normal ENMT: Mucous membranes are moist. Posterior pharynx clear of any exudate or lesions.Normal dentition.  Autoscope exam did reveal right ear cerumen but without impaction/infection.  Tympanic membrane not clearly  visible Neck: normal, supple, no masses, no thyromegaly Respiratory: clear to auscultation bilaterally, no wheezing, no crackles. Normal respiratory effort. No accessory muscle use.  Cardiovascular: Regular rate and rhythm, no murmurs / rubs / gallops. No extremity edema. 2+ pedal pulses. No carotid bruits.  Abdomen: no tenderness, no masses palpated. No hepatosplenomegaly. Bowel sounds positive.  Musculoskeletal: no clubbing / cyanosis. No joint deformity upper and lower extremities. Good ROM, no contractures. Normal muscle tone.  Neurologic: CN 2-12 grossly intact. Sensation intact, DTR normal. Strength 5/5 in all 4.  Psychiatric: Normal judgment and insight. Alert and oriented x 3. Normal mood.  SKIN/catheters: no rashes, lesions, ulcers. No induration  Labs on Admission: I have personally reviewed following labs and imaging studies  CBC: Recent Labs  Lab 02/23/19 0755  WBC 7.2  HGB 15.5  HCT 46.6  MCV 95.1  PLT 694   Basic Metabolic Panel: Recent Labs  Lab  02/23/19 0755 02/23/19 1340  NA 139  --   K 4.1  --   CL 107  --   CO2 22  --   GLUCOSE 178*  --   BUN 18  --   CREATININE 1.36*  --   CALCIUM 9.6  --   MG  --  1.9   GFR: Estimated Creatinine Clearance: 55.2 mL/min (A) (by C-G formula based on SCr of 1.36 mg/dL (H)). Liver Function Tests: No results for input(s): AST, ALT, ALKPHOS, BILITOT, PROT, ALBUMIN in the last 168 hours. No results for input(s): LIPASE, AMYLASE in the last 168 hours. No results for input(s): AMMONIA in the last 168 hours. Coagulation Profile: No results for input(s): INR, PROTIME in the last 168 hours. Cardiac Enzymes: No results for input(s): CKTOTAL, CKMB, CKMBINDEX, TROPONINI in the last 168 hours. BNP (last 3 results) No results for input(s): PROBNP in the last 8760 hours. HbA1C: Recent Labs    02/23/19 1340  HGBA1C 5.9*   CBG: No results for input(s): GLUCAP in the last 168 hours. Lipid Profile: No results for input(s):  CHOL, HDL, LDLCALC, TRIG, CHOLHDL, LDLDIRECT in the last 72 hours. Thyroid Function Tests: Recent Labs    02/23/19 1340  TSH 1.309   Anemia Panel: No results for input(s): VITAMINB12, FOLATE, FERRITIN, TIBC, IRON, RETICCTPCT in the last 72 hours. Urine analysis:    Component Value Date/Time   COLORURINE YELLOW 02/23/2019 1130   APPEARANCEUR CLEAR 02/23/2019 1130   LABSPEC 1.013 02/23/2019 1130   PHURINE 8.0 02/23/2019 1130   GLUCOSEU NEGATIVE 02/23/2019 1130   HGBUR NEGATIVE 02/23/2019 1130   BILIRUBINUR NEGATIVE 02/23/2019 1130   KETONESUR NEGATIVE 02/23/2019 1130   PROTEINUR NEGATIVE 02/23/2019 1130   UROBILINOGEN 1.0 01/06/2015 1839   NITRITE NEGATIVE 02/23/2019 1130   LEUKOCYTESUR NEGATIVE 02/23/2019 1130    Radiological Exams on Admission: Personally reviewed  Dg Chest 2 View  Result Date: 02/23/2019 CLINICAL DATA:  68 year old male with cough chest pain shortness of breath and weakness. EXAM: CHEST - 2 VIEW COMPARISON:  Chest radiograph 11/17/2017 and earlier. FINDINGS: PA and lateral views. Stable mild tortuosity of the descending thoracic aorta. Cardiac size at the upper limits of normal. Other mediastinal contours are within normal limits. Visualized tracheal air column is within normal limits. Lung volumes are within normal limits. Both lungs appear stable and clear. Negative visible bowel gas pattern. No acute osseous abnormality identified. IMPRESSION: No acute cardiopulmonary abnormality. Electronically Signed   By: Genevie Ann M.D.   On: 02/23/2019 10:02    EKG: Independently reviewed.  Sinus rhythm with type I second-degree AV block and heart rate of 62     Assessment and Plan:   1.  Second-degree AV block: Initially concern for third-degree AV block based on electronic reading.  However on independent review and per cardiology consultation, appears to be Wenckebach similar to prior EKGs.  TSH ordered.  Plan for resuming opiates at lower frequency and avoiding AV  blocking agents.  Although his home medication list shows clonidine, patient states he stopped using this medication many months back.  Will admit for observation  2. Atypical chest pain: Likely GERD/GI related rather than cardiac.  Troponin x2 unremarkable.  Chest x-ray within normal limits.  Doubt if symptomatic from problem #1 as patient has had this finding for a while without any symptoms. Will obtain echo.  Appreciate cardiology evaluation.   3.  Earache/sore throat?  Hoarseness: Patient states he has been referred to ENT-"oral surgeon" by PCP.  No acute issues currently.  His voice is at his baseline.  Chest x-ray within normal limits.  4.  Diabetes mellitus:?  Diet controlled.  Patient states he was told by his PCP that he no longer needs Metformin.  Check hemoglobin A1c  5.  Hypertension: Patient systolic blood pressure was transiently elevated but on recheck with repositioning of his arm his blood pressure reading was 138/70 when checked by myself in the room.  Will resume home medications including lisinopril/HCTZ that he takes.  He no longer takes clonidine.  Avoid AV blockers.  6.  Chronic back pain: Resumed Percocet at 3 times daily dosing (takes 4 times daily at home)-patient advised of worsening bradycardia/AV block with heavy doses of opiates.  He was advised to discuss further with his pain clinic.  DVT prophylaxis: Lovenox  COVID screen: Pending results  Code Status: Full code   .Health care proxy would be his friend Minna Merritts  Patient/Family Communication: Discussed with patient and all questions answered to satisfaction.  Consults called: Cardiology Admission status : Admit to observation status as anticipated length of stay less than 2 midnights Expected LOS: 1 day  Addendum: Paged by nurse earlier that patient anxious to get his home pain medication dosage-this was resumed as discussed with patient earlier.  Paged by nurse again an hour later that patient had several other  complaints regarding food, lighting in the room, care by staff etc. and left AMA.  Guilford Shi MD [facility] Pager [phone]  If 7PM-7AM, please contact night-coverage [email or web address] Password [facility]  02/23/2019, 3:28 PM

## 2019-02-23 NOTE — ED Triage Notes (Signed)
Pt in with c/o dysuria and weakness x 1 mo. Denies any odor or blood in urine. Just feels overall weakness and "no energy". Hx of prostate CA w/prostatectomy in 03'. Also c/o chest tightness at night, trouble sleeping

## 2019-02-23 NOTE — ED Notes (Signed)
Dr. Earnest Conroy paged to [id number] Lorrin Goodell, RN paged by Levada Dy

## 2019-03-01 DIAGNOSIS — Z79899 Other long term (current) drug therapy: Secondary | ICD-10-CM | POA: Diagnosis not present

## 2019-03-01 DIAGNOSIS — M542 Cervicalgia: Secondary | ICD-10-CM | POA: Diagnosis not present

## 2019-03-01 DIAGNOSIS — M25512 Pain in left shoulder: Secondary | ICD-10-CM | POA: Diagnosis not present

## 2019-03-01 DIAGNOSIS — G8929 Other chronic pain: Secondary | ICD-10-CM | POA: Diagnosis not present

## 2019-03-01 DIAGNOSIS — M545 Low back pain: Secondary | ICD-10-CM | POA: Diagnosis not present

## 2019-03-29 DIAGNOSIS — M545 Low back pain: Secondary | ICD-10-CM | POA: Diagnosis not present

## 2019-03-29 DIAGNOSIS — R892 Abnormal level of other drugs, medicaments and biological substances in specimens from other organs, systems and tissues: Secondary | ICD-10-CM | POA: Diagnosis not present

## 2019-03-29 DIAGNOSIS — Z79899 Other long term (current) drug therapy: Secondary | ICD-10-CM | POA: Diagnosis not present

## 2019-03-29 DIAGNOSIS — M25512 Pain in left shoulder: Secondary | ICD-10-CM | POA: Diagnosis not present

## 2019-03-29 DIAGNOSIS — G8929 Other chronic pain: Secondary | ICD-10-CM | POA: Diagnosis not present

## 2019-04-07 ENCOUNTER — Other Ambulatory Visit: Payer: Self-pay | Admitting: Family Medicine

## 2019-04-07 ENCOUNTER — Other Ambulatory Visit (HOSPITAL_COMMUNITY): Payer: Self-pay | Admitting: Family Medicine

## 2019-04-07 DIAGNOSIS — M545 Low back pain, unspecified: Secondary | ICD-10-CM

## 2019-04-07 DIAGNOSIS — G8929 Other chronic pain: Secondary | ICD-10-CM

## 2019-04-15 DIAGNOSIS — G8929 Other chronic pain: Secondary | ICD-10-CM | POA: Diagnosis not present

## 2019-04-15 DIAGNOSIS — J439 Emphysema, unspecified: Secondary | ICD-10-CM | POA: Diagnosis not present

## 2019-04-15 DIAGNOSIS — K08109 Complete loss of teeth, unspecified cause, unspecified class: Secondary | ICD-10-CM | POA: Diagnosis not present

## 2019-04-15 DIAGNOSIS — E669 Obesity, unspecified: Secondary | ICD-10-CM | POA: Diagnosis not present

## 2019-04-15 DIAGNOSIS — M2550 Pain in unspecified joint: Secondary | ICD-10-CM | POA: Diagnosis not present

## 2019-04-15 DIAGNOSIS — E119 Type 2 diabetes mellitus without complications: Secondary | ICD-10-CM | POA: Diagnosis not present

## 2019-04-15 DIAGNOSIS — N529 Male erectile dysfunction, unspecified: Secondary | ICD-10-CM | POA: Diagnosis not present

## 2019-04-15 DIAGNOSIS — I10 Essential (primary) hypertension: Secondary | ICD-10-CM | POA: Diagnosis not present

## 2019-04-15 DIAGNOSIS — C61 Malignant neoplasm of prostate: Secondary | ICD-10-CM | POA: Diagnosis not present

## 2019-04-15 DIAGNOSIS — H547 Unspecified visual loss: Secondary | ICD-10-CM | POA: Diagnosis not present

## 2019-04-27 DIAGNOSIS — Z79899 Other long term (current) drug therapy: Secondary | ICD-10-CM | POA: Diagnosis not present

## 2019-04-27 DIAGNOSIS — M25512 Pain in left shoulder: Secondary | ICD-10-CM | POA: Diagnosis not present

## 2019-04-27 DIAGNOSIS — M545 Low back pain: Secondary | ICD-10-CM | POA: Diagnosis not present

## 2019-04-27 DIAGNOSIS — G8929 Other chronic pain: Secondary | ICD-10-CM | POA: Diagnosis not present

## 2019-09-28 ENCOUNTER — Telehealth: Payer: Self-pay | Admitting: Hospice

## 2019-09-28 NOTE — Telephone Encounter (Signed)
Spoke with patient regarding Palliative services and all questions were answered and he was in agreement with starting services.  I have scheduled an In-person Consult for 10/06/19 @ 8:30 AM

## 2019-10-06 ENCOUNTER — Other Ambulatory Visit: Payer: Self-pay

## 2019-10-06 ENCOUNTER — Other Ambulatory Visit: Payer: Medicare HMO | Admitting: Hospice

## 2019-10-06 DIAGNOSIS — J449 Chronic obstructive pulmonary disease, unspecified: Secondary | ICD-10-CM

## 2019-10-06 DIAGNOSIS — Z515 Encounter for palliative care: Secondary | ICD-10-CM

## 2019-10-06 DIAGNOSIS — C61 Malignant neoplasm of prostate: Secondary | ICD-10-CM

## 2019-10-06 NOTE — Progress Notes (Signed)
[facility] Consult Note Telephone: [phone]  Fax: [phone]  PATIENT NAME: Ryan Hopkins DOB: Jul 05, 1950 MRN: [id number]  PRIMARY CARE PROVIDER:   Carylon Perches, NP  REFERRING PROVIDER: Carylon Perches, NP  RESPONSIBLE PARTY:  Self Contact: Ryan Hopkins: [phone]    RECOMMENDATIONS/PLAN:   Advance Care Planning/Goals of Care: Visit at the request of Carylon Perches, NP for palliative consult. Visit consisted of building trust and discussions on Palliative Medicine as specialized medical care for people living with serious illness, aimed at facilitating better quality of life through symptoms relief, assisting with advance care plan and establishing goals of care.  Patient was emotional he underwent surgery for prostate cancer years ago and that since then he has continued with urinary incontinence.  He said he never wanted surgery of any type again.  He elected to be a DO NOT RESUSCITATE.  DNR form signed for patient, same uploaded to epic today.  Goals of care include to maximize quality of life and symptom management.  Visit consisted of counseling and education dealing with the complex and emotionally intense issues of symptom management and palliative care in the setting of serious and potentially life-threatening illness.  He shared he lives alone, has 2 children who are all out of town.  He is open to hospice services in the future.  Palliative care team will continue to support patient, patient's family, and medical team. Symptom management: Patient in no respiratory distress, no coughing.  He said he uses his  breathing treatments for COPD; no recent COPD flare.  Normal respiratory effort on room air.  He continues with dribbling/urinary incontinence likely related to hx of prostrate cancer.  He endorses occasional low back and right knee pain which he said are well managed with his current pain regimen.  He is ambulatory without assistive  device care.  He is compliant with his his medications.  Patient in no acute distress, preferred to have visit in a sit-out within building complex.  Encouraged ongoing care.  Start Follow up: Palliative care will continue to follow patient for goals of care clarification and symptom management. I spent 1 hour and 16 minutes providing this initial consultation; time iincludes time spent with patient/family, chart review, provider coordination,  and documentation. More than 50% of the time in this consultation was spent on coordinating communication  HISTORY OF PRESENT ILLNESS:  Ryan Hopkins is a 69 y.o. year old male with multiple medical problems including COPD, hypertension, prostate cancer, chronic back pain.  Palliative Care was asked to help address goals of care.   CODE STATUS: DNR  PPS: 70% HOSPICE ELIGIBILITY/DIAGNOSIS: TBD  PAST MEDICAL HISTORY:  Past Medical History:  Diagnosis Date  . Anal fissure   . Back pain, chronic   . COPD (chronic obstructive pulmonary disease) ([place])   . Degenerative arthritis of spine   . Diabetes mellitus without complication ([place])   . Diverticulitis   . Emphysema   . Enlarged prostate   . Heart murmur   . Hypertension   . Nocturia   . Prostate cancer ([place]) 11/2013  . TIA (transient ischemic attack)     SOCIAL HX:  Social History   Tobacco Use  . Smoking status: Former Smoker    Packs/day: 1.00    Years: 20.00    Pack years: 20.00    Quit date: 01/16/2013    Years since quitting: 6.7  . Smokeless tobacco: Never Used  Substance Use Topics  . Alcohol use:  Not Currently    Comment: daly beer 40oz    ALLERGIES:  Allergies  Allergen Reactions  . Vicodin [Hydrocodone-Acetaminophen] Nausea Only    PT. STATED IT MAKES HIM NAUSEATED .     PERTINENT MEDICATIONS:  Outpatient Encounter Medications as of 10/06/2019  Medication Sig  . albuterol (PROVENTIL HFA;VENTOLIN HFA) 108 (90 BASE) MCG/ACT inhaler Inhale 2 puffs into the lungs every 6  (six) hours as needed for wheezing or shortness of breath (wheezing).   Marland Kitchen atorvastatin (LIPITOR) 10 MG tablet Take 10 mg by mouth daily.   . cloNIDine (CATAPRES) 0.1 MG tablet Take 0.1 mg by mouth 2 (two) times daily.  . Fluticasone-Salmeterol (ADVAIR DISKUS) 250-50 MCG/DOSE AEPB Inhale 2 puffs into the lungs 2 (two) times daily.  Marland Kitchen ibuprofen (ADVIL) 800 MG tablet Take 800 mg by mouth every 8 (eight) hours as needed for moderate pain.  Marland Kitchen lisinopril (PRINIVIL,ZESTRIL) 20 MG tablet Take 1 tablet (20 mg total) by mouth daily.  . meloxicam (MOBIC) 15 MG tablet Take 1 tablet (15 mg total) by mouth daily.  . metFORMIN (GLUCOPHAGE) 500 MG tablet Take 1 tablet (500 mg total) by mouth 2 (two) times daily with a meal.  . oxyCODONE-acetaminophen (PERCOCET) 10-325 MG tablet Take 1 tablet by mouth every 4 (four) hours as needed for pain.  . sildenafil (REVATIO) 20 MG tablet Take 20 mg by mouth as needed.   . SYMBICORT 160-4.5 MCG/ACT inhaler Inhale 2 puffs into the lungs 2 (two) times daily.   No facility-administered encounter medications on file as of 10/06/2019.    PHYSICAL EXAM/ROS:  General: NAD, cooperative Cardiovascular: Denies chest pain Pulmonary: Normal respiratory effort, no coughing no shortness of breath GU: Reports chronic dribbling/incontinence Skin: no rashes to exposed skin Neurological: Alert and oriented x4  Teodoro Spray, NP

## 2019-10-27 ENCOUNTER — Other Ambulatory Visit: Payer: Self-pay

## 2019-10-27 ENCOUNTER — Encounter (HOSPITAL_COMMUNITY): Payer: Self-pay

## 2019-10-27 ENCOUNTER — Emergency Department (HOSPITAL_COMMUNITY)
Admission: EM | Admit: 2019-10-27 | Discharge: 2019-10-27 | Disposition: A | Discharge status: Home / Self Care | Payer: Medicare HMO | Attending: Emergency Medicine | Admitting: Emergency Medicine

## 2019-10-27 DIAGNOSIS — K5900 Constipation, unspecified: Secondary | ICD-10-CM | POA: Diagnosis not present

## 2019-10-27 DIAGNOSIS — R1030 Lower abdominal pain, unspecified: Secondary | ICD-10-CM | POA: Insufficient documentation

## 2019-10-27 DIAGNOSIS — Z5321 Procedure and treatment not carried out due to patient leaving prior to being seen by health care provider: Secondary | ICD-10-CM | POA: Diagnosis not present

## 2019-10-27 LAB — URINALYSIS, ROUTINE W REFLEX MICROSCOPIC
Bilirubin Urine: NEGATIVE
Glucose, UA: NEGATIVE mg/dL
Ketones, ur: NEGATIVE mg/dL
Nitrite: NEGATIVE
Protein, ur: 30 mg/dL — AB
Specific Gravity, Urine: 1.011 (ref 1.005–1.030)
WBC, UA: >50 WBC/hpf — ABNORMAL HIGH (ref 0–5)
pH: 5 (ref 5.0–8.0)

## 2019-10-27 LAB — CBC
HCT: 49.8 % (ref 39.0–52.0)
Hemoglobin: 16.9 g/dL (ref 13.0–17.0)
MCH: 31 pg (ref 26.0–34.0)
MCHC: 33.9 g/dL (ref 30.0–36.0)
MCV: 91.4 fL (ref 80.0–100.0)
Platelets: 226 10*3/uL (ref 150–400)
RBC: 5.45 MIL/uL (ref 4.22–5.81)
RDW: 14.1 % (ref 11.5–15.5)
WBC: 10.5 10*3/uL (ref 4.0–10.5)
nRBC: 0 % (ref 0.0–0.2)

## 2019-10-27 LAB — COMPREHENSIVE METABOLIC PANEL
ALT: 25 U/L (ref 0–44)
AST: 23 U/L (ref 15–41)
Albumin: 3.6 g/dL (ref 3.5–5.0)
Alkaline Phosphatase: 76 U/L (ref 38–126)
Anion gap: 11 (ref 5–15)
BUN: 20 mg/dL (ref 8–23)
CO2: 21 mmol/L — ABNORMAL LOW (ref 22–32)
Calcium: 9.1 mg/dL (ref 8.9–10.3)
Chloride: 102 mmol/L (ref 98–111)
Creatinine, Ser: 1.25 mg/dL — ABNORMAL HIGH (ref 0.61–1.24)
GFR calc Af Amer: >60 mL/min (ref >60)
GFR calc non Af Amer: 58 mL/min — ABNORMAL LOW (ref >60)
Glucose, Bld: 114 mg/dL — ABNORMAL HIGH (ref 70–99)
Potassium: 4.2 mmol/L (ref 3.5–5.1)
Sodium: 134 mmol/L — ABNORMAL LOW (ref 135–145)
Total Bilirubin: 0.8 mg/dL (ref 0.3–1.2)
Total Protein: 7.1 g/dL (ref 6.5–8.1)

## 2019-10-27 LAB — LIPASE, BLOOD: Lipase: 39 U/L (ref 11–51)

## 2019-10-27 NOTE — ED Notes (Signed)
Called multiple times by this RN and [place], pt did not answer.

## 2019-10-27 NOTE — ED Triage Notes (Signed)
Pt reports lower abd pain for the past 2 days, denies n/v. Some constipation and pain with urination, hx of prostate cancer. Denies blood in stools. Pt a.o

## 2019-11-13 ENCOUNTER — Other Ambulatory Visit: Payer: Medicare HMO | Admitting: Hospice

## 2019-11-13 ENCOUNTER — Other Ambulatory Visit: Payer: Self-pay

## 2019-11-13 DIAGNOSIS — Z515 Encounter for palliative care: Secondary | ICD-10-CM

## 2019-11-13 DIAGNOSIS — J449 Chronic obstructive pulmonary disease, unspecified: Secondary | ICD-10-CM

## 2019-11-13 NOTE — Progress Notes (Signed)
[facility] Consult Note Telephone: [phone]  Fax: [phone]  PATIENT NAME: Ryan Hopkins DOB: [birth year] MRN: [id number]  PRIMARY CARE PROVIDER:   Carylon Perches, NP  REFERRING PROVIDER: Carylon Perches, NP  RESPONSIBLE PARTY:  Self [phone] Contact: [place]: [phone]    RECOMMENDATIONS/PLAN:   Advance Care Planning/Goals of Care: Visit at the request of Carylon Perches, NP for palliative consult.  Patient is a DO NOT RESUSCITATE.   Goals of care include to maximize quality of life and symptom management.  Counseling and education dealing with the complex and emotionally intense issues of symptom management and palliative care in the setting of serious and potentially life-threatening illness. Patient is coping by being optimistic, eating well and walking around his apartment complex.  He is Darrick Meigs faith is also a coping mechanism.  He is open to hospice services in the future.  Palliative care team will continue to support patient, patient's family, and medical team. Symptom management: Patient went to the hospital last month for abdominal pain. He stated he left after some hours of not getting attended to; when he came back, he took some baking soda diluted in water and the abdominal pain is gone. No compliant of abdominal of water during visit. Patient in no respiratory distress, no coughing.  He said he uses his  breathing treatments for COPD; no recent COPD flare; no breathing treatments.   Normal respiratory effort on room air. Takes Lisinopril for HTN, Gabapentin and oxycodone for pain; effective. He continues with dribbling/urinary incontinence likely related to hx of prostrate cancer.  He endorses occasional low back and right knee pain which he said are well managed with his current pain regimen.  He is ambulatory without assistive device care.  He is compliant with his his medications.  Patient with no acute concerns.   Encouraged ongoing care.  Follow up: Palliative care will continue to follow patient for goals of care clarification and symptom management. I spent  1 hour and 10 minutes providing this initial consultation; time iincludes time spent with patient/family, chart review, provider coordination,  and documentation. More than 50% of the time in this consultation was spent on coordinating communication  HISTORY OF PRESENT ILLNESS:  Ryan Hopkins is a 69 y.o. year old male with multiple medical problems including COPD, hypertension, prostate cancer, chronic back pain.  Palliative Care was asked to help address goals of care.   CODE STATUS: DNR  PPS: 70% HOSPICE ELIGIBILITY/DIAGNOSIS: TBD  PAST MEDICAL HISTORY:  Past Medical History:  Diagnosis Date  . Anal fissure   . Back pain, chronic   . COPD (chronic obstructive pulmonary disease) ([place])   . Degenerative arthritis of spine   . Diabetes mellitus without complication ([place])   . Diverticulitis   . Emphysema   . Enlarged prostate   . Heart murmur   . Hypertension   . Nocturia   . Prostate cancer ([place]) 11/2013  . TIA (transient ischemic attack)     SOCIAL HX:  Social History   Tobacco Use  . Smoking status: Former Smoker    Packs/day: 1.00    Years: 20.00    Pack years: 20.00    Quit date: 01/16/2013    Years since quitting: 6.8  . Smokeless tobacco: Never Used  Substance Use Topics  . Alcohol use: Not Currently    Comment: daly beer 40oz    ALLERGIES:  Allergies  Allergen Reactions  . Vicodin [Hydrocodone-Acetaminophen] Nausea Only  PT. STATED IT MAKES HIM NAUSEATED .     PERTINENT MEDICATIONS:  Outpatient Encounter Medications as of 11/13/2019  Medication Sig  . albuterol (PROVENTIL HFA;VENTOLIN HFA) 108 (90 BASE) MCG/ACT inhaler Inhale 2 puffs into the lungs every 6 (six) hours as needed for wheezing or shortness of breath (wheezing).   Marland Kitchen atorvastatin (LIPITOR) 10 MG tablet Take 10 mg by mouth daily.   . cloNIDine  (CATAPRES) 0.1 MG tablet Take 0.1 mg by mouth 2 (two) times daily.  . Fluticasone-Salmeterol (ADVAIR DISKUS) 250-50 MCG/DOSE AEPB Inhale 2 puffs into the lungs 2 (two) times daily.  Marland Kitchen ibuprofen (ADVIL) 800 MG tablet Take 800 mg by mouth every 8 (eight) hours as needed for moderate pain.  Marland Kitchen lisinopril (PRINIVIL,ZESTRIL) 20 MG tablet Take 1 tablet (20 mg total) by mouth daily.  . meloxicam (MOBIC) 15 MG tablet Take 1 tablet (15 mg total) by mouth daily.  . metFORMIN (GLUCOPHAGE) 500 MG tablet Take 1 tablet (500 mg total) by mouth 2 (two) times daily with a meal.  . oxyCODONE-acetaminophen (PERCOCET) 10-325 MG tablet Take 1 tablet by mouth every 4 (four) hours as needed for pain.  . sildenafil (REVATIO) 20 MG tablet Take 20 mg by mouth as needed.   . SYMBICORT 160-4.5 MCG/ACT inhaler Inhale 2 puffs into the lungs 2 (two) times daily.   No facility-administered encounter medications on file as of 11/13/2019.    PHYSICAL EXAM/ROS  General: NAD, cooperative Cardiovascular: regular rate and rhythm; denies chest pain Pulmonary: clear ant/post fields; no adventitious lung sounds on auscultation Abdomen: soft, nontender, + bowel sounds GU: no suprapubic tenderness Extremities: no edema, no joint deformities Skin: no rashes to exposed skin Neurological: Weakness but otherwise nonfocal  Teodoro Spray, NP

## 2020-01-01 ENCOUNTER — Telehealth: Payer: Self-pay | Admitting: Hospice

## 2020-01-01 NOTE — Telephone Encounter (Signed)
NP showed up at apartment complex address for visit as agreed; patient not responding to the door.  NP called patient 2 times and not able to drop voicemail.

## 2020-05-15 ENCOUNTER — Telehealth: Payer: Self-pay

## 2020-05-15 NOTE — Telephone Encounter (Signed)
Volunteer called patient on behalf of Palliative Care and did not get a answer from patient/family. ° °

## 2020-11-19 ENCOUNTER — Other Ambulatory Visit: Payer: Self-pay

## 2020-11-19 ENCOUNTER — Emergency Department (HOSPITAL_COMMUNITY): Payer: Medicare HMO

## 2020-11-19 ENCOUNTER — Emergency Department (HOSPITAL_COMMUNITY)
Admission: EM | Admit: 2020-11-19 | Discharge: 2020-11-19 | Disposition: A | Discharge status: Home / Self Care | Payer: Medicare HMO | Attending: Emergency Medicine | Admitting: Emergency Medicine

## 2020-11-19 ENCOUNTER — Encounter (HOSPITAL_COMMUNITY): Payer: Self-pay | Admitting: Emergency Medicine

## 2020-11-19 DIAGNOSIS — Z87891 Personal history of nicotine dependence: Secondary | ICD-10-CM | POA: Insufficient documentation

## 2020-11-19 DIAGNOSIS — K5900 Constipation, unspecified: Secondary | ICD-10-CM | POA: Insufficient documentation

## 2020-11-19 DIAGNOSIS — I10 Essential (primary) hypertension: Secondary | ICD-10-CM | POA: Insufficient documentation

## 2020-11-19 DIAGNOSIS — Z79899 Other long term (current) drug therapy: Secondary | ICD-10-CM | POA: Diagnosis not present

## 2020-11-19 DIAGNOSIS — R1030 Lower abdominal pain, unspecified: Secondary | ICD-10-CM

## 2020-11-19 DIAGNOSIS — R310 Gross hematuria: Secondary | ICD-10-CM | POA: Diagnosis not present

## 2020-11-19 DIAGNOSIS — Z7984 Long term (current) use of oral hypoglycemic drugs: Secondary | ICD-10-CM | POA: Diagnosis not present

## 2020-11-19 DIAGNOSIS — E1169 Type 2 diabetes mellitus with other specified complication: Secondary | ICD-10-CM | POA: Insufficient documentation

## 2020-11-19 DIAGNOSIS — Z8546 Personal history of malignant neoplasm of prostate: Secondary | ICD-10-CM | POA: Diagnosis not present

## 2020-11-19 DIAGNOSIS — E669 Obesity, unspecified: Secondary | ICD-10-CM | POA: Insufficient documentation

## 2020-11-19 DIAGNOSIS — Z7951 Long term (current) use of inhaled steroids: Secondary | ICD-10-CM | POA: Diagnosis not present

## 2020-11-19 DIAGNOSIS — J449 Chronic obstructive pulmonary disease, unspecified: Secondary | ICD-10-CM | POA: Diagnosis not present

## 2020-11-19 DIAGNOSIS — R1033 Periumbilical pain: Secondary | ICD-10-CM | POA: Insufficient documentation

## 2020-11-19 LAB — URINALYSIS, ROUTINE W REFLEX MICROSCOPIC
Bilirubin Urine: NEGATIVE
Glucose, UA: >=500 mg/dL — AB
Ketones, ur: NEGATIVE mg/dL
Leukocytes,Ua: NEGATIVE
Nitrite: NEGATIVE
Protein, ur: NEGATIVE mg/dL
RBC / HPF: >50 RBC/hpf — ABNORMAL HIGH (ref 0–5)
Specific Gravity, Urine: 1.021 (ref 1.005–1.030)
pH: 5 (ref 5.0–8.0)

## 2020-11-19 LAB — CBC
HCT: 50.1 % (ref 39.0–52.0)
Hemoglobin: 17 g/dL (ref 13.0–17.0)
MCH: 30.5 pg (ref 26.0–34.0)
MCHC: 33.9 g/dL (ref 30.0–36.0)
MCV: 89.8 fL (ref 80.0–100.0)
Platelets: 226 10*3/uL (ref 150–400)
RBC: 5.58 MIL/uL (ref 4.22–5.81)
RDW: 14.8 % (ref 11.5–15.5)
WBC: 10.9 10*3/uL — ABNORMAL HIGH (ref 4.0–10.5)
nRBC: 0 % (ref 0.0–0.2)

## 2020-11-19 LAB — COMPREHENSIVE METABOLIC PANEL
ALT: 14 U/L (ref 0–44)
AST: 18 U/L (ref 15–41)
Albumin: 3.7 g/dL (ref 3.5–5.0)
Alkaline Phosphatase: 69 U/L (ref 38–126)
Anion gap: 14 (ref 5–15)
BUN: 20 mg/dL (ref 8–23)
CO2: 21 mmol/L — ABNORMAL LOW (ref 22–32)
Calcium: 9.7 mg/dL (ref 8.9–10.3)
Chloride: 99 mmol/L (ref 98–111)
Creatinine, Ser: 1.58 mg/dL — ABNORMAL HIGH (ref 0.61–1.24)
GFR, Estimated: 46 mL/min — ABNORMAL LOW (ref >60)
Glucose, Bld: 267 mg/dL — ABNORMAL HIGH (ref 70–99)
Potassium: 3.8 mmol/L (ref 3.5–5.1)
Sodium: 134 mmol/L — ABNORMAL LOW (ref 135–145)
Total Bilirubin: 0.5 mg/dL (ref 0.3–1.2)
Total Protein: 7.1 g/dL (ref 6.5–8.1)

## 2020-11-19 LAB — LIPASE, BLOOD: Lipase: 165 U/L — ABNORMAL HIGH (ref 11–51)

## 2020-11-19 LAB — LACTIC ACID, PLASMA
Lactic Acid, Venous: 2 mmol/L (ref 0.5–1.9)
Lactic Acid, Venous: 1.1 mmol/L (ref 0.5–1.9)

## 2020-11-19 MED ORDER — OXYCODONE-ACETAMINOPHEN 5-325 MG PO TABS
1.0000 | ORAL_TABLET | Freq: Once | ORAL | Status: AC
  Administered 2020-11-19: 1 via ORAL
  Filled 2020-11-19: qty 1

## 2020-11-19 MED ORDER — ONDANSETRON 4 MG PO TBDP
4.0000 mg | ORAL_TABLET | Freq: Once | ORAL | Status: AC
  Administered 2020-11-19: 4 mg via ORAL
  Filled 2020-11-19: qty 1

## 2020-11-19 MED ORDER — CIPROFLOXACIN HCL 500 MG PO TABS: 500.0000 mg | ORAL_TABLET | Freq: Two times a day (BID) | ORAL | 0 refills | Status: AC

## 2020-11-19 MED ORDER — IOHEXOL 300 MG/ML  SOLN
75.0000 mL | Freq: Once | INTRAMUSCULAR | Status: AC | PRN
  Administered 2020-11-19: 75 mL via INTRAVENOUS

## 2020-11-19 MED ORDER — OXYCODONE HCL 5 MG PO TABS
5.0000 mg | ORAL_TABLET | Freq: Once | ORAL | Status: AC
  Administered 2020-11-19: 5 mg via ORAL
  Filled 2020-11-19: qty 1

## 2020-11-19 MED ORDER — OXYCODONE-ACETAMINOPHEN 5-325 MG PO TABS
2.0000 | ORAL_TABLET | Freq: Once | ORAL | Status: AC
  Administered 2020-11-19: 2 via ORAL
  Filled 2020-11-19: qty 2

## 2020-11-19 NOTE — ED Provider Notes (Signed)
[facility] EMERGENCY DEPARTMENT Provider Note   CSN: [id number] Arrival date & time: 11/19/20  0241     History Chief Complaint  Patient presents with   Abdominal Pain / Hematuria    Hematuria    Ryan Hopkins is a 70 y.o. male.  Patient with history of prostate cancer status post prostatectomy, urinary incontinence, diabetes, COPD -- presents to the emergency department today for evaluation of abdominal pain and hematuria.  Patient states that about a week ago he started noting blood in his urine.  He passed a couple of clots at times.  He had burning in the urethra with voiding.  This gradually improved however he developed mid to lower abdominal pain.  Pain was severe enough that it was causing him difficulty sleeping, prompting emergency department visit early this morning.  No associated fever or chills.  No chest pain or shortness of breath.  No vomiting.  He reports constipation over the past week without diarrhea.  Dysuria is improved.  No treatments prior to arrival.  Follows at [facility] urology.      Past Medical History:  Diagnosis Date   Anal fissure    Back pain, chronic    COPD (chronic obstructive pulmonary disease) ([facility])    Degenerative arthritis of spine    Diabetes mellitus without complication ([place])    Diverticulitis    Emphysema    Enlarged prostate    Heart murmur    Hypertension    Nocturia    Prostate cancer ([place]) 11/2013   TIA (transient ischemic attack)     Patient Active Problem List   Diagnosis Date Noted   Heart block AV second degree 02/23/2019   IGT (impaired glucose tolerance) 10/27/2018   Obesity (BMI 30.0-34.9) 10/27/2018   HTN (hypertension) 10/27/2018   Acute chest pain    Chest pain 08/30/2015   Facial droop 08/30/2015   AKI (acute kidney injury) ([place]) 08/30/2015   OSA (obstructive sleep apnea) 08/12/2015   COPD (chronic obstructive pulmonary disease) ([place]) 08/12/2015   Prostate cancer ([place]) 03/16/2014    Past  Surgical History:  Procedure Laterality Date   INGUINAL HERNIA REPAIR Right years ago   [place]  2007   L4-L5   LYMPHADENECTOMY Bilateral 03/16/2014   Procedure: LYMPHADENECTOMY;  Surgeon: Ardis Hughs, MD;  Location: [facility] ORS;  Service: Urology;  Laterality: Bilateral;   ROBOT ASSISTED LAPAROSCOPIC RADICAL PROSTATECTOMY N/A 03/16/2014   Procedure: ROBOTIC ASSISTED LAPAROSCOPIC RADICAL PROSTATECTOMY;  Surgeon: Ardis Hughs, MD;  Location: [facility];  Service: Urology;  Laterality: N/A;       Family History  Problem Relation Age of Onset   Colon cancer Sister    Prostate cancer Father    Diabetes Mother    Heart failure Mother    Cancer Mother        cervical    Social History   Tobacco Use   Smoking status: Former    Packs/day: 1.00    Years: 20.00    Pack years: 20.00    Types: Cigarettes    Quit date: 01/16/2013    Years since quitting: 7.8   Smokeless tobacco: Never  Substance Use Topics   Alcohol use: Not Currently    Comment: daly beer 40oz   Drug use: Not Currently    Types: Marijuana    Comment: Quit marijuana years ago    Home Medications Prior to Admission medications   Medication Sig Start Date End Date Taking? Authorizing Provider  albuterol (  PROVENTIL HFA;VENTOLIN HFA) 108 (90 BASE) MCG/ACT inhaler Inhale 2 puffs into the lungs every 6 (six) hours as needed for wheezing or shortness of breath (wheezing).     [provider]  atorvastatin (LIPITOR) 10 MG tablet Take 10 mg by mouth daily.     [provider]  cloNIDine (CATAPRES) 0.1 MG tablet Take 0.1 mg by mouth 2 (two) times daily. 10/14/18   [provider]  Fluticasone-Salmeterol (ADVAIR DISKUS) 250-50 MCG/DOSE AEPB Inhale 2 puffs into the lungs 2 (two) times daily. 02/12/16   [provider]  ibuprofen (ADVIL) 800 MG tablet Take 800 mg by mouth every 8 (eight) hours as needed for moderate pain.    [provider]  lisinopril  (PRINIVIL,ZESTRIL) 20 MG tablet Take 1 tablet (20 mg total) by mouth daily. 05/26/16   Carmin Muskrat, MD  meloxicam (MOBIC) 15 MG tablet Take 1 tablet (15 mg total) by mouth daily. 10/27/18   Isaac Bliss, Rayford Halsted, MD  metFORMIN (GLUCOPHAGE) 500 MG tablet Take 1 tablet (500 mg total) by mouth 2 (two) times daily with a meal. 09/09/15   Reyne Dumas, MD  oxyCODONE-acetaminophen (PERCOCET) 10-325 MG tablet Take 1 tablet by mouth every 4 (four) hours as needed for pain.    [provider]  sildenafil (REVATIO) 20 MG tablet Take 20 mg by mouth as needed.  10/14/18   [provider]  SYMBICORT 160-4.5 MCG/ACT inhaler Inhale 2 puffs into the lungs 2 (two) times daily. 10/14/18   [provider]    Allergies    Vicodin [hydrocodone-acetaminophen]  Review of Systems   Review of Systems  Constitutional:  Negative for fever.  HENT:  Negative for rhinorrhea and sore throat.   Eyes:  Negative for redness.  Respiratory:  Negative for cough.   Cardiovascular:  Negative for chest pain.  Gastrointestinal:  Positive for abdominal pain and constipation. Negative for diarrhea, nausea and vomiting.  Genitourinary:  Positive for dysuria and hematuria.  Musculoskeletal:  Negative for myalgias.  Skin:  Negative for rash.  Neurological:  Negative for headaches.   Physical Exam Updated Vital Signs BP 108/81   Pulse 69   Temp 97.7 F (36.5 C)   Resp 16   SpO2 100%   Physical Exam Vitals and nursing note reviewed.  Constitutional:      General: He is not in acute distress.    Appearance: He is well-developed.  HENT:     Head: Normocephalic and atraumatic.  Eyes:     General:        Right eye: No discharge.        Left eye: No discharge.     Conjunctiva/sclera: Conjunctivae normal.  Cardiovascular:     Rate and Rhythm: Normal rate and regular rhythm.     Heart sounds: Normal heart sounds.  Pulmonary:     Effort: Pulmonary effort is normal.     Breath sounds: Normal  breath sounds.  Abdominal:     Palpations: Abdomen is soft.     Tenderness: There is abdominal tenderness. There is no guarding or rebound.     Comments: Patient reports tenderness in the periumbilical area and suprapubic area.  He looks mildly uncomfortable when I push in these areas, no rebound or guarding.  Musculoskeletal:     Cervical back: Normal range of motion and neck supple.  Skin:    General: Skin is warm and dry.  Neurological:     Mental Status: He is alert.    ED  Results / Procedures / Treatments   Labs (all labs ordered are listed, but only abnormal results are displayed) Labs Reviewed  LIPASE, BLOOD - Abnormal; Notable for the following components:      Result Value   Lipase 165 (*)    All other components within normal limits  COMPREHENSIVE METABOLIC PANEL - Abnormal; Notable for the following components:   Sodium 134 (*)    CO2 21 (*)    Glucose, Bld 267 (*)    Creatinine, Ser 1.58 (*)    GFR, Estimated 46 (*)    All other components within normal limits  CBC - Abnormal; Notable for the following components:   WBC 10.9 (*)    All other components within normal limits  URINALYSIS, ROUTINE W REFLEX MICROSCOPIC - Abnormal; Notable for the following components:   APPearance HAZY (*)    Glucose, UA >=500 (*)    Hgb urine dipstick LARGE (*)    RBC / HPF >50 (*)    Bacteria, UA FEW (*)    All other components within normal limits  LACTIC ACID, PLASMA - Abnormal; Notable for the following components:   Lactic Acid, Venous 2.0 (*)    All other components within normal limits  URINE CULTURE  LACTIC ACID, PLASMA  POC OCCULT BLOOD, ED    EKG None  Radiology CT ABDOMEN PELVIS W CONTRAST  Result Date: 11/19/2020 CLINICAL DATA:  Acute abdominal pain, gross hematuria X 3 days, rectal bleeding 3 days ago EXAM: CT ABDOMEN AND PELVIS WITH CONTRAST TECHNIQUE: Multidetector CT imaging of the abdomen and pelvis was performed using the standard protocol following bolus  administration of intravenous contrast. CONTRAST:  4mL OMNIPAQUE IOHEXOL 300 MG/ML  SOLN COMPARISON:  11/17/2017 FINDINGS: Lower chest: No pleural or pericardial effusion. Minimal dependent atelectasis posteriorly in the lung bases. Hepatobiliary: No focal liver abnormality is seen. No gallstones, gallbladder wall thickening, or biliary dilatation. Pancreas: Unremarkable. No pancreatic ductal dilatation or surrounding inflammatory changes. Spleen: Normal in size without focal abnormality. Adrenals/Urinary Tract: Adrenal glands are unremarkable. Kidneys are normal, without renal calculi, focal lesion, or hydronephrosis. Bladder is incompletely distended, unremarkable. Stomach/Bowel: Stomach is within normal limits. Appendix appears normal. No evidence of bowel wall thickening, distention, or inflammatory changes. Vascular/Lymphatic: Calcified aortoiliac plaque without aneurysm. No abdominal or pelvic adenopathy. Reproductive: Post prostatectomy Other: No ascites.  No free air. Musculoskeletal: Early grade 1 anterolisthesis L4-5 without pars defect, presumably related to facet DJD. Mild bilateral hip DJD. No fracture or worrisome bone lesion. IMPRESSION: 1. No acute findings. 2.  Aortic Atherosclerosis (ICD10-170.0). Electronically Signed   By: Lucrezia Europe M.D.   On: 11/19/2020 11:37    Procedures Procedures   Medications Ordered in ED Medications  oxyCODONE (Oxy IR/ROXICODONE) immediate release tablet 5 mg (has no administration in time range)  oxyCODONE-acetaminophen (PERCOCET/ROXICET) 5-325 MG per tablet 2 tablet (2 tablets Oral Given 11/19/20 0418)  ondansetron (ZOFRAN-ODT) disintegrating tablet 4 mg (4 mg Oral Given 11/19/20 0418)  oxyCODONE-acetaminophen (PERCOCET/ROXICET) 5-325 MG per tablet 1 tablet (1 tablet Oral Given 11/19/20 1201)  iohexol (OMNIPAQUE) 300 MG/ML solution 75 mL (75 mLs Intravenous Contrast Given 11/19/20 1103)    ED Course  I have reviewed the triage vital signs and the nursing  notes.  Pertinent labs & imaging results that were available during my care of the patient were reviewed by me and considered in my medical decision making (see chart for details).  Patient seen and examined.  Patient seen after prolonged ED wait time of  10 hours.  I reviewed and agree with work-up ordered at arrival.  This includes UA which demonstrates blood and some white blood cells.  Urine culture added.  He had a CT scan which does not have any acute findings.  Interestingly his lipase was elevated in the 100s, however clinically does not have pancreatitis and CT does not show any abnormalities in the pancreas.  Will discuss with Dr. Johnney Killian.  Additional medication for pain ordered.   Vital signs reviewed and are as follows: BP 108/81   Pulse 69   Temp 97.7 F (36.5 C)   Resp 16   SpO2 100%   Discussed plan with Dr. Johnney Killian, who is in agreement.   Will send urine culture, discharged with prescription for ciprofloxacin (borderline GFR but 500mg  okay per up-to-date guidelines).  Strongly encourage PCP and/or urology follow-up.  Discussed need for recheck of lipase to ensure that this normalizes.  Encouraged return with worsening pain, vomiting, fever. Patient verbalizes understanding and agrees with plan.     MDM Rules/Calculators/A&P                          Patient with lower abdominal pain, hematuria. Vitals are stable, no fever. Labs overall reassuring. White blood cell count mildly elevated. Lipase is elevated without other clinical features of pancreatitis. CKD at baseline. Imaging with CT normal. No signs of dehydration, patient is tolerating PO's. Lungs are clear and no signs suggestive of PNA. Concern for pancreatitis given no other obvious cause.  Low concern for appendicitis, cholecystitis, pancreatitis, ruptured viscus, kidney stone, aortic dissection, aortic aneurysm or other emergent abdominal etiology. Supportive therapy indicated with return if symptoms worsen.    Final  Clinical Impression(s) / ED Diagnoses Final diagnoses:  Gross hematuria  Lower abdominal pain    Rx / DC Orders ED Discharge Orders          Ordered    ciprofloxacin (CIPRO) 500 MG tablet  2 times daily        11/19/20 1442             Carlisle Cater, PA-C 11/19/20 1548    Charlesetta Shanks, MD 11/28/20 [phone]

## 2020-11-19 NOTE — ED Triage Notes (Signed)
Patient reports pain across lower abdomen with hematuria for several days , history of prostate cancer . No fever or chills . CBG= 361.

## 2020-11-19 NOTE — Progress Notes (Signed)
Mr Faucett was brought to CT from the ED Waiting room. I placed a 22g [employer] IV for his scan. Pt was returned to ED waiting room with IV in place. EMT notified.

## 2020-11-19 NOTE — ED Provider Notes (Signed)
Emergency Medicine Provider Triage Evaluation Note  Ryan Hopkins , a 70 y.o. male  was evaluated in triage.  Pt complains of severe abd pain.  Pt reports some dysuria and several days of hematuria but this seems to have improved.  2 days ago he developed severe lower abdominal pain that has continued to worsen.  Has a history of prostate cancer and colon polyps.  Last colonoscopy was 15 years ago.  Patient reports he has also had some bright red blood per rectum in the last day or so.  Denies nausea or vomiting, fever or chills..  Review of Systems  Positive: Abdominal pain, hematuria, bright red blood per rectum Negative: Fever, chills  Physical Exam  BP 134/84 (BP Location: Right Arm)   Pulse 63   Temp 99.3 F (37.4 C)   Resp 19   SpO2 98%  Gen:   Awake, no distress   Resp:  Normal effort  MSK:   Moves extremities without difficulty  Other:  Abdomen soft, tender in the lower quadrants.  Medical Decision Making  Medically screening exam initiated at 3:18 AM.  Appropriate orders placed.  Ashden Sonnenberg was informed that the remainder of the evaluation will be completed by another provider, this initial triage assessment does not replace that evaluation, and the importance of remaining in the ED until their evaluation is complete.  Abdominal pain, hematuria.  Possible GI bleed.  Labs and CT scan pending.   Satchel Heidinger, Gwenlyn Perking 11/19/20 0327    Mesner, Corene Cornea, MD 11/19/20 [phone]

## 2020-11-19 NOTE — ED Provider Notes (Signed)
I provided a substantive portion of the care of this patient.  I personally performed the entirety of the history for this encounter.      Patient reports has been having lower abdominal discomfort.  He does have problems intermittently with incontinence since prostatectomy but is also now had increased pain and hematuria.  He has passed some clots.  Patient is alert and nontoxic.  Mental status clear.  Heart regular.  Lungs grossly clear.  Abdomen soft.  He endorses some discomfort in the epigastrium.  Moderate discomfort lower abdomen.  Patient presents with predominantly lower abdominal discomfort.  CT does not show acute findings.  Patient does have hematuria and some dysuria.  Lipase is minimally elevated.  CT does not show acute findings of the pancreas.  I have reviewed this with the patient.  At this time plan will be for repeat values on outpatient basis with return if he is noting increasing epigastric discomfort or vomiting.  Agree with treating for UTI and culturing with close follow-up with urology and PCP.   Charlesetta Shanks, MD 11/19/20 1358

## 2020-11-19 NOTE — Discharge Instructions (Signed)
Please read and follow all provided instructions.  Your diagnoses today include:  1. Gross hematuria   2. Lower abdominal pain     Tests performed today include: Blood cell counts and platelets Kidney and liver function tests - weak kidney function close to your baseline Pancreas function test (called lipase) Urine test to look for infection CT scan of the abdomen and pelvis - did not show any concerning problems Vital signs. See below for your results today.   Medications prescribed:  Ciprofloxacin - antibiotic  You have been prescribed an antibiotic medicine: take the entire course of medicine even if you are feeling better. Stopping early can cause the antibiotic not to work.  Take any prescribed medications only as directed.  Home care instructions:  Follow any educational materials contained in this packet.  Follow-up instructions: Please follow-up with your primary care provider and/or urologist in the next 3 days for further evaluation of your symptoms.    Return instructions:  SEEK IMMEDIATE MEDICAL ATTENTION IF: The pain does not go away or becomes severe  A temperature above 101F develops  Repeated vomiting occurs (multiple episodes)  The pain becomes localized to portions of the abdomen. The right side could possibly be appendicitis. In an adult, the left lower portion of the abdomen could be colitis or diverticulitis.  Blood is being passed in stools or vomit (bright red or black tarry stools)  You develop chest pain, difficulty breathing, dizziness or fainting, or become confused, poorly responsive, or inconsolable (young children) If you have any other emergent concerns regarding your health  Additional Information: Abdominal (belly) pain can be caused by many things. Your caregiver performed an examination and possibly ordered blood/urine tests and imaging (CT scan, x-rays, ultrasound). Many cases can be observed and treated at home after initial evaluation in  the emergency department. Even though you are being discharged home, abdominal pain can be unpredictable. Therefore, you need a repeated exam if your pain does not resolve, returns, or worsens. Most patients with abdominal pain don't have to be admitted to the hospital or have surgery, but serious problems like appendicitis and gallbladder attacks can start out as nonspecific pain. Many abdominal conditions cannot be diagnosed in one visit, so follow-up evaluations are very important.  Your vital signs today were: BP (!) 137/109   Pulse 75   Temp 97.7 F (36.5 C)   Resp 10   SpO2 96%  If your blood pressure (bp) was elevated above 135/85 this visit, please have this repeated by your doctor within one month. --------------

## 2020-11-19 NOTE — ED Notes (Signed)
Called 3x for vitals, no answer

## 2020-11-21 LAB — URINE CULTURE: Culture: >=100000 — AB

## 2020-11-22 ENCOUNTER — Telehealth: Payer: Self-pay | Admitting: Emergency Medicine

## 2020-11-22 NOTE — Telephone Encounter (Unsigned)
Post ED Visit - Positive Culture Follow-up  Culture report reviewed by antimicrobial stewardship pharmacist: [facility] '[]'$  Elenor Quinones, Pharm.D. '[]'$  Heide Guile, Pharm.D., BCPS AQ-ID '[]'$  Parks Neptune, Pharm.D., BCPS '[]'$  Alycia Rossetti, Pharm.D., BCPS '[]'$  Bertram, Pharm.D., BCPS, AAHIVP '[]'$  Legrand Como, Pharm.D., BCPS, AAHIVP '[]'$  Salome Arnt, PharmD, BCPS '[]'$  Johnnette Gourd, PharmD, BCPS '[]'$  Hughes Better, PharmD, BCPS '[x]'$  Lorelei Pont, PharmD '[]'$  Laqueta Linden, PharmD, BCPS '[]'$  Albertina Parr, PharmD  [facility] Team '[]'$  Leodis Sias, PharmD '[]'$  Lindell Spar, PharmD '[]'$  Royetta Asal, PharmD '[]'$  Graylin Shiver, Rph '[]'$  Rema Fendt) Glennon Mac, PharmD '[]'$  Arlyn Dunning, PharmD '[]'$  Netta Cedars, PharmD '[]'$  Dia Sitter, PharmD '[]'$  Leone Haven, PharmD '[]'$  Gretta Arab, PharmD '[]'$  Theodis Shove, PharmD '[]'$  Peggyann Juba, PharmD '[]'$  Reuel Boom, PharmD   Positive urine culture Treated with Ciprofloxacin, organism sensitive to the same and no further patient follow-up is required at this time.  Sandi Raveling Richard Holz 11/22/2020, 2:52 PM
